# Patient Record
Sex: Female | Born: 1945 | Race: White | Hispanic: No | Marital: Married | Smoking: Never smoker
Health system: Southern US, Community
[De-identification: ages and names within clinical notes are randomized; demographics above are authoritative.]

## PROBLEM LIST (undated history)

## (undated) ENCOUNTER — Emergency Department (HOSPITAL_COMMUNITY): Admission: EM | Payer: Medicare Other | Source: Home / Self Care

## (undated) DIAGNOSIS — E669 Obesity, unspecified: Secondary | ICD-10-CM

## (undated) DIAGNOSIS — F329 Major depressive disorder, single episode, unspecified: Secondary | ICD-10-CM

## (undated) DIAGNOSIS — I482 Chronic atrial fibrillation, unspecified: Secondary | ICD-10-CM

## (undated) DIAGNOSIS — H1132 Conjunctival hemorrhage, left eye: Secondary | ICD-10-CM

## (undated) DIAGNOSIS — E119 Type 2 diabetes mellitus without complications: Secondary | ICD-10-CM

## (undated) DIAGNOSIS — C55 Malignant neoplasm of uterus, part unspecified: Secondary | ICD-10-CM

## (undated) DIAGNOSIS — M1990 Unspecified osteoarthritis, unspecified site: Secondary | ICD-10-CM

## (undated) DIAGNOSIS — I10 Essential (primary) hypertension: Secondary | ICD-10-CM

## (undated) DIAGNOSIS — I5032 Chronic diastolic (congestive) heart failure: Secondary | ICD-10-CM

## (undated) DIAGNOSIS — F32A Depression, unspecified: Secondary | ICD-10-CM

## (undated) DIAGNOSIS — D509 Iron deficiency anemia, unspecified: Secondary | ICD-10-CM

## (undated) HISTORY — PX: ENUCLEATION: SHX628

## (undated) HISTORY — PX: BASAL CELL CARCINOMA EXCISION: SHX1214

## (undated) HISTORY — PX: ABDOMINAL HYSTERECTOMY: SHX81

## (undated) HISTORY — DX: Malignant neoplasm of uterus, part unspecified: C55

## (undated) HISTORY — PX: DILATION AND CURETTAGE OF UTERUS: SHX78

## (undated) HISTORY — DX: Obesity, unspecified: E66.9

## (undated) HISTORY — PX: OTHER SURGICAL HISTORY: SHX169

---

## 2000-10-04 ENCOUNTER — Encounter: Payer: Self-pay | Admitting: Family Medicine

## 2000-10-04 ENCOUNTER — Ambulatory Visit (HOSPITAL_COMMUNITY): Admission: RE | Admit: 2000-10-04 | Discharge: 2000-10-04 | Payer: Self-pay | Admitting: Family Medicine

## 2001-05-08 ENCOUNTER — Emergency Department (HOSPITAL_COMMUNITY): Admission: EM | Admit: 2001-05-08 | Discharge: 2001-05-08 | Payer: Self-pay | Admitting: Emergency Medicine

## 2001-10-09 ENCOUNTER — Encounter: Payer: Self-pay | Admitting: Family Medicine

## 2001-10-09 ENCOUNTER — Ambulatory Visit (HOSPITAL_COMMUNITY): Admission: RE | Admit: 2001-10-09 | Discharge: 2001-10-09 | Payer: Self-pay | Admitting: Family Medicine

## 2002-06-29 ENCOUNTER — Ambulatory Visit (HOSPITAL_COMMUNITY): Admission: RE | Admit: 2002-06-29 | Discharge: 2002-06-29 | Payer: Self-pay | Admitting: Family Medicine

## 2002-06-29 ENCOUNTER — Encounter: Payer: Self-pay | Admitting: Family Medicine

## 2002-09-28 ENCOUNTER — Ambulatory Visit (HOSPITAL_COMMUNITY): Admission: RE | Admit: 2002-09-28 | Discharge: 2002-09-28 | Payer: Self-pay | Admitting: Family Medicine

## 2002-09-28 ENCOUNTER — Encounter: Payer: Self-pay | Admitting: Family Medicine

## 2002-10-10 ENCOUNTER — Ambulatory Visit (HOSPITAL_COMMUNITY): Admission: RE | Admit: 2002-10-10 | Discharge: 2002-10-10 | Payer: Self-pay | Admitting: *Deleted

## 2002-11-05 ENCOUNTER — Encounter (HOSPITAL_COMMUNITY): Admission: RE | Admit: 2002-11-05 | Discharge: 2002-12-05 | Payer: Self-pay | Admitting: Cardiology

## 2002-11-19 ENCOUNTER — Ambulatory Visit (HOSPITAL_COMMUNITY): Admission: RE | Admit: 2002-11-19 | Discharge: 2002-11-19 | Payer: Self-pay | Admitting: Internal Medicine

## 2004-06-11 ENCOUNTER — Ambulatory Visit (HOSPITAL_COMMUNITY): Admission: RE | Admit: 2004-06-11 | Discharge: 2004-06-11 | Payer: Self-pay | Admitting: Ophthalmology

## 2005-07-21 ENCOUNTER — Ambulatory Visit (HOSPITAL_COMMUNITY): Admission: RE | Admit: 2005-07-21 | Discharge: 2005-07-21 | Payer: Self-pay | Admitting: Family Medicine

## 2005-08-18 ENCOUNTER — Ambulatory Visit (HOSPITAL_COMMUNITY): Admission: RE | Admit: 2005-08-18 | Discharge: 2005-08-18 | Payer: Self-pay | Admitting: Family Medicine

## 2005-08-23 ENCOUNTER — Encounter (INDEPENDENT_AMBULATORY_CARE_PROVIDER_SITE_OTHER): Payer: Self-pay | Admitting: *Deleted

## 2005-08-23 ENCOUNTER — Encounter: Admission: RE | Admit: 2005-08-23 | Discharge: 2005-08-23 | Payer: Self-pay | Admitting: Family Medicine

## 2007-01-12 ENCOUNTER — Ambulatory Visit (HOSPITAL_COMMUNITY): Admission: RE | Admit: 2007-01-12 | Discharge: 2007-01-12 | Payer: Self-pay | Admitting: Family Medicine

## 2007-04-18 ENCOUNTER — Ambulatory Visit (HOSPITAL_COMMUNITY): Admission: RE | Admit: 2007-04-18 | Discharge: 2007-04-18 | Payer: Self-pay | Admitting: Family Medicine

## 2007-11-27 ENCOUNTER — Ambulatory Visit (HOSPITAL_COMMUNITY): Admission: RE | Admit: 2007-11-27 | Discharge: 2007-11-27 | Payer: Self-pay | Admitting: Ophthalmology

## 2007-12-11 ENCOUNTER — Ambulatory Visit (HOSPITAL_COMMUNITY): Admission: RE | Admit: 2007-12-11 | Discharge: 2007-12-11 | Payer: Self-pay | Admitting: Ophthalmology

## 2008-09-02 ENCOUNTER — Ambulatory Visit (HOSPITAL_COMMUNITY): Admission: RE | Admit: 2008-09-02 | Discharge: 2008-09-02 | Payer: Self-pay | Admitting: Family Medicine

## 2008-09-09 ENCOUNTER — Ambulatory Visit (HOSPITAL_COMMUNITY): Admission: RE | Admit: 2008-09-09 | Discharge: 2008-09-09 | Payer: Self-pay | Admitting: Ophthalmology

## 2008-10-02 ENCOUNTER — Ambulatory Visit (HOSPITAL_COMMUNITY): Admission: RE | Admit: 2008-10-02 | Discharge: 2008-10-02 | Payer: Self-pay | Admitting: Family Medicine

## 2008-10-22 ENCOUNTER — Ambulatory Visit: Payer: Self-pay | Admitting: Cardiology

## 2008-10-23 ENCOUNTER — Ambulatory Visit (HOSPITAL_COMMUNITY): Admission: RE | Admit: 2008-10-23 | Discharge: 2008-10-23 | Payer: Self-pay | Admitting: Cardiology

## 2008-10-23 ENCOUNTER — Encounter: Payer: Self-pay | Admitting: Cardiology

## 2008-10-23 ENCOUNTER — Ambulatory Visit: Payer: Self-pay | Admitting: Cardiology

## 2008-11-08 ENCOUNTER — Encounter (INDEPENDENT_AMBULATORY_CARE_PROVIDER_SITE_OTHER): Payer: Self-pay | Admitting: *Deleted

## 2008-11-08 LAB — CONVERTED CEMR LAB
Brain Natriuretic Peptide: 118.7
CO2: 26 meq/L
Calcium: 8.8 mg/dL
Chloride: 105 meq/L
Glucose, Bld: 166 mg/dL
Sodium: 144 meq/L

## 2008-11-22 ENCOUNTER — Ambulatory Visit: Payer: Self-pay | Admitting: Cardiology

## 2008-12-19 DIAGNOSIS — R0602 Shortness of breath: Secondary | ICD-10-CM

## 2008-12-19 DIAGNOSIS — I10 Essential (primary) hypertension: Secondary | ICD-10-CM

## 2008-12-20 ENCOUNTER — Ambulatory Visit: Payer: Self-pay | Admitting: Cardiology

## 2009-04-25 ENCOUNTER — Encounter (INDEPENDENT_AMBULATORY_CARE_PROVIDER_SITE_OTHER): Payer: Self-pay | Admitting: *Deleted

## 2009-04-25 ENCOUNTER — Ambulatory Visit: Payer: Self-pay | Admitting: Cardiology

## 2009-04-25 DIAGNOSIS — E669 Obesity, unspecified: Secondary | ICD-10-CM | POA: Insufficient documentation

## 2009-04-25 DIAGNOSIS — I110 Hypertensive heart disease with heart failure: Secondary | ICD-10-CM | POA: Insufficient documentation

## 2009-04-25 DIAGNOSIS — I509 Heart failure, unspecified: Secondary | ICD-10-CM

## 2009-04-25 DIAGNOSIS — E1121 Type 2 diabetes mellitus with diabetic nephropathy: Secondary | ICD-10-CM | POA: Insufficient documentation

## 2009-05-16 ENCOUNTER — Encounter: Payer: Self-pay | Admitting: Cardiology

## 2009-05-16 ENCOUNTER — Encounter (INDEPENDENT_AMBULATORY_CARE_PROVIDER_SITE_OTHER): Payer: Self-pay | Admitting: *Deleted

## 2009-05-16 LAB — CONVERTED CEMR LAB
AST: 32 units/L
Albumin: 4.5 g/dL (ref 3.5–5.2)
BUN: 14 mg/dL
CO2: 29 meq/L
Calcium: 9.4 mg/dL (ref 8.4–10.5)
Chloride: 103 meq/L
Chloride: 103 meq/L (ref 96–112)
Cholesterol: 106 mg/dL
HDL: 30 mg/dL
HDL: 30 mg/dL — ABNORMAL LOW (ref >39)
LDL Cholesterol: 54 mg/dL (ref 0–99)
Potassium: 3.8 meq/L
Potassium: 3.8 meq/L (ref 3.5–5.3)
Sodium: 143 meq/L
Sodium: 143 meq/L (ref 135–145)
Total Protein: 7.5 g/dL
Total Protein: 7.5 g/dL (ref 6.0–8.3)
Triglycerides: 108 mg/dL
VLDL: 22 mg/dL (ref 0–40)

## 2009-05-19 ENCOUNTER — Encounter (INDEPENDENT_AMBULATORY_CARE_PROVIDER_SITE_OTHER): Payer: Self-pay | Admitting: *Deleted

## 2009-05-26 ENCOUNTER — Ambulatory Visit: Payer: Self-pay | Admitting: Cardiology

## 2009-07-07 ENCOUNTER — Ambulatory Visit: Payer: Self-pay | Admitting: Cardiology

## 2009-08-11 ENCOUNTER — Ambulatory Visit: Payer: Self-pay | Admitting: Cardiology

## 2009-09-08 ENCOUNTER — Ambulatory Visit: Payer: Self-pay | Admitting: Cardiology

## 2010-04-21 ENCOUNTER — Encounter (INDEPENDENT_AMBULATORY_CARE_PROVIDER_SITE_OTHER): Payer: Self-pay | Admitting: *Deleted

## 2010-04-28 ENCOUNTER — Ambulatory Visit: Payer: Self-pay | Admitting: Cardiology

## 2010-04-28 LAB — CONVERTED CEMR LAB
ALT: 19 units/L (ref 0–35)
Basophils Absolute: 0.1 10*3/uL (ref 0.0–0.1)
Calcium: 9.6 mg/dL (ref 8.4–10.5)
Eosinophils Relative: 2 % (ref 0–5)
Glucose, Bld: 162 mg/dL — ABNORMAL HIGH (ref 70–99)
HCT: 41.4 % (ref 36.0–46.0)
Hemoglobin: 13.4 g/dL (ref 12.0–15.0)
Hgb A1c MFr Bld: 7.5 % — ABNORMAL HIGH (ref <5.7)
Lymphocytes Relative: 31 % (ref 12–46)
Monocytes Absolute: 0.6 10*3/uL (ref 0.1–1.0)
Neutro Abs: 5.1 10*3/uL (ref 1.7–7.7)
Platelets: 183 10*3/uL (ref 150–400)
Potassium: 4.3 meq/L (ref 3.5–5.3)
RDW: 14.7 % (ref 11.5–15.5)
Total Bilirubin: 0.8 mg/dL (ref 0.3–1.2)
Total Protein: 6.7 g/dL (ref 6.0–8.3)

## 2010-07-14 NOTE — Assessment & Plan Note (Signed)
Summary: 1 mth bp check per checkout on 2/28/tg  Nurse Visit   Vital Signs:  Patient profile:   65 year old female Height:      67 inches Weight:      286 pounds O2 Sat:      97 % on Room air Pulse rate:   71 / minute BP sitting:   121 / 70  (left arm)  Vitals Entered By: Teressa Lower RN (September 08, 2009 8:45 AM)  O2 Flow:  Room air  Visit Type:  1 month bp check Primary Provider:  Dr. Nobie Putnam   History of Present Illness: short of breath,pounding heart hear beat in her ear with exertion, 5 lbs wt gain in 1 month, 1+ pitting edema in lower legs, l>r bp diary brought by pt: 8 readings: 08-13-09        156/61,69             cbg:   192 08-23-09      1499/64,69            cbg:  175 08-25-09      148/72,70              cbg:  187 08-30-09      151/63,70              cbg:  169 09-04-09      158/62,70              cbg:  179 09-05-09      148/60,69              cbg:  147 09-07-09      154/58,72              cbg:  177 09-08-09      153/59,68              cbg:  137 pt took a rx of pcn, not hers, two times a day,until she developed diarrhea then she stopped,it was for an abcess tooth.    Current Medications (verified): 1)  Labetalol Hcl 300 Mg Tabs (Labetalol Hcl) .... Take 1 Tablet By Mouth Two Times A Day 2)  Metformin Hcl 500 Mg Tabs (Metformin Hcl) .... Take 1 Tab Three Times A Day 3)  Losartan Potassium 100 Mg Tabs (Losartan Potassium) .... Take 1 Tab Daily 4)  Glyburide 5 Mg Tabs (Glyburide) .... Take 1 Tab Three Times A Day 5)  Torsemide 20 Mg Tabs (Torsemide) .... Take 1 Tab Two Times A Day 6)  Clonidine Hcl 0.2 Mg Tabs (Clonidine Hcl) .... Take 1 Tablet By Mouth Two Times A Day 7)  Alprazolam 0.5 Mg Tabs (Alprazolam) .... Take 1/2 -1 Tablet By Mouth Every 6 Hrs As Needed 8)  Verapamil Hcl Cr 360 Mg Xr24h-Cap (Verapamil Hcl) .... Take One Capsule By Mouth Daily 9)  Simvastatin 20 Mg Tabs (Simvastatin) .... Take One Tablet By Mouth Daily At Bedtime  Allergies (verified): 1)  !  Ace Inhibitors  Impression & Recommendations:  Problem # 1:  HYPERTENSION (ICD-401.9) Increase labetalol to 450 mg two times a day  [place] Bing, M.D.  Appended Document: 1 mth bp check per checkout on 2/28/tg    Phone Note Outgoing Call   Call placed by: Larita Fife Via LPN,  September 10, 2009 11:34 AM Summary of Call: Patient advised, she gave verbal understanding.    New/Updated Medications: LABETALOL HCL 300 MG TABS (LABETALOL HCL) Take 1 and 1/2 tablets (450mg ) by mouth two times a  day Prescriptions: LABETALOL HCL 300 MG TABS (LABETALOL HCL) Take 1 and 1/2 tablets (450mg ) by mouth two times a day  #45 x 3   Entered by:   Larita Fife Via LPN   Authorized by:   Kathlen Brunswick, MD, [facility]   Signed by:   Larita Fife Via LPN on 16/03/9603   Method used:   [facility]        [facility] [phone]* (retail)       [address]       [place], [place]  [address]       Ph: [phone] or [phone]       Fax: [phone]   RxID:   [id number]

## 2010-07-14 NOTE — Assessment & Plan Note (Signed)
Summary: 1 mth nurse visit per Tammy/tg  Nurse Visit   Vital Signs:  Patient profile:   65 year old female O2 Sat:      95 % Pulse rate:   75 / minute BP sitting:   127 / 67  (left arm)  Vitals Entered ByLarita Fife Via LPN (August 11, 2009 8:52 AM)  Impression & Recommendations:  Problem # 1:  HYPERTENSIVE CARDIOVASCULAR DISEASE, WITH CHF (ICD-402.91) Increase clonidine to .2 two times a day Continue home BPs BP check in 1 month. Call office for side effects.  Pt should not change meds on her own.  [place] Bing, M.D. Patient advised and she states she understands instructions given.  Primary Provider:  Dr. Nobie Putnam   History of Present Illness: 1 month BP check. Patient c/o SOB with exertion, otherwise she has no complaints at this time. The increase in Labetalol made her very tired and weak so she cut back to one 300mg  tablet two times a day (instead of 1 and 1/2) and states she feels better. Her BP diary shows readings higher than todays visit of 127/67. BP's scanned into chart.      Allergies: 1)  ! Ace Inhibitors Prescriptions: CLONIDINE HCL 0.2 MG TABS (CLONIDINE HCL) take 1 tablet by mouth two times a day  #60 x 6   Entered by:   Larita Fife Via LPN   Authorized by:   Kathlen Brunswick, MD, [facility]   Signed by:   Larita Fife Via LPN on 78/29/5621   Method used:   Electronically to        [facility] (retail)       [address]       [place], [place]  [address]       Ph: [phone]       Fax: [phone]   RxID:   [id number]

## 2010-07-14 NOTE — Miscellaneous (Signed)
Summary: labs cmp,lipids,05/16/2009  Clinical Lists Changes  Observations: Added new observation of CALCIUM: 9.4 mg/dL (13/01/6577 46:96) Added new observation of ALBUMIN: 4.5 g/dL (29/52/8413 24:40) Added new observation of PROTEIN, TOT: 7.5 g/dL (04/10/2535 64:40) Added new observation of SGPT (ALT): 20 units/L (05/16/2009 10:45) Added new observation of SGOT (AST): 32 units/L (05/16/2009 10:45) Added new observation of ALK PHOS: 96 units/L (05/16/2009 10:45) Added new observation of CREATININE: 0.93 mg/dL (34/74/2595 63:87) Added new observation of BUN: 14 mg/dL (56/43/3295 18:84) Added new observation of BG RANDOM: 192 mg/dL (16/60/6301 60:10) Added new observation of CO2 PLSM/SER: 29 meq/L (05/16/2009 10:45) Added new observation of CL SERUM: 103 meq/L (05/16/2009 10:45) Added new observation of K SERUM: 3.8 meq/L (05/16/2009 10:45) Added new observation of NA: 143 meq/L (05/16/2009 10:45) Added new observation of LDL: 54 mg/dL (93/23/5573 22:02) Added new observation of HDL: 30 mg/dL (54/27/0623 76:28) Added new observation of TRIGLYC TOT: 108 mg/dL (31/51/7616 07:37) Added new observation of CHOLESTEROL: 106 mg/dL (10/62/6948 54:62)

## 2010-07-14 NOTE — Assessment & Plan Note (Signed)
Summary: 1 YR F/U PER CHECKOUT ON 04/25/09/TG   Visit Type:  Follow-up Primary Provider:  Dr.Golding   History of Present Illness: [name] returns to the office for continued assessment and treatment of hypertension and diabetes.  Since she was last seen, she has done quite well with no new medical problems, no need for urgent medical attention and improved control of both diabetes and hypertension.  She achieved exactly a 20 pound weight loss over the past 12 months, which she tells me I wagered her that she could not do.  As a result, fasting CBGs have fallen from 200 to 150.  Current Medications (verified): 1)  Labetalol Hcl 300 Mg Tabs (Labetalol Hcl) .... Take 1 and 1/2 Tablets (450mg ) By Mouth Two Times A Day 2)  Metformin Hcl 1000 Mg Tabs (Metformin Hcl) .... Take 1 Tab Two Times A Day 3)  Losartan Potassium 100 Mg Tabs (Losartan Potassium) .... Take 1 Tab Daily 4)  Glyburide 5 Mg Tabs (Glyburide) .... Take 1 Tab Two Times A Day 5)  Torsemide 20 Mg Tabs (Torsemide) .... Take 1 Tab Two Times A Day 6)  Clonidine Hcl 0.2 Mg Tabs (Clonidine Hcl) .... Take 1 Tablet By Mouth Two Times A Day 7)  Alprazolam 0.5 Mg Tabs (Alprazolam) .... Take 1/2 -1 Tablet By Mouth Every 6 Hrs As Needed 8)  Verapamil Hcl Cr 360 Mg Xr24h-Cap (Verapamil Hcl) .... Take One Capsule By Mouth Daily 9)  Simvastatin 20 Mg Tabs (Simvastatin) .... Take One Tablet By Mouth Daily At Bedtime  Allergies (verified): 1)  ! Ace Inhibitors  Comments:  Nurse/Medical Assistant: patients metformin has changed from 500 two times a day to 1000 two times a day  and glipizide two times a day instead of tid  Past History:  PMH, FH, and Social History reviewed and updated.  Review of Systems       The patient complains of weight loss.  The patient denies hoarseness, chest pain, dyspnea on exertion, peripheral edema, and prolonged cough.    Vital Signs:  Patient profile:   65 year old female Weight:      266  pounds BMI:     41.81 O2 Sat:      96 % on Room air Pulse rate:   83 / minute BP sitting:   138 / 75  (right arm)  Vitals Entered By: Dreama Saa, CNA (April 28, 2010 2:17 PM)  O2 Flow:  Room air  Physical Exam  General:   General-Well developed; no acute distress; obese Neck-No JVD; no carotid bruits: Lungs-No tachypnea, no rales; no rhonchi; no wheezes: Cardiovascular-normal PMI; normal S1 and S2; minimal systolic murmur at the cardiac base Abdomen-BS normal; soft and non-tender without masses or organomegaly:  Musculoskeletal-No deformities, no cyanosis or clubbing: Neurologic-Normal cranial nerves; symmetric strength and tone:  Skin-Warm, no significant lesions: Extremities-Nl distal pulses; 1/2+ edema:     Impression & Recommendations:  Problem # 1:  HYPERTENSIVE CARDIOVASCULAR DISEASE, WITH CHF (ICD-402.91) Hypertension and CHF are well compensated at the present time; current therapy will be continued.  Appropriate laboratory studies have been requested.  Problem # 2:  DIABETES MELLITUS, TYPE II (ICD-250.00) Diabetic control appears to be improved; a hemoglobin A1c level will be obtained.  Problem # 3:  OBESITY (ICD-278.00) Patient is congratulated on weight loss and not charged for this visit.  She is encouraged to lose an additional 40 pounds prior to the time I see her again.  Other Orders: T-Comprehensive  Metabolic Panel [id number]) T-CBC w/Diff [id number]) T-Hgb A1C ([id number])  Patient Instructions: 1)  Your physician recommends that you schedule a follow-up appointment in: 1 year 2)  Your physician recommends that you return for lab work in: today 3)  Your physician recommends that you continue on your current medications as directed. Please refer to the Current Medication list given to you today. Prescriptions: TORSEMIDE 20 MG TABS (TORSEMIDE) take 1 tab two times a day  #60 x 11   Entered by:   Larita Fife Via LPN   Authorized by:   Kathlen Brunswick, MD, [facility]   Signed by:   Larita Fife Via LPN on 42/59/5638   Method used:   [facility]ronically to        [facility] [phone]* (retail)       [address]       [place], [place]  [address]       Ph: [phone] or [phone]       Fax: [phone]   RxID:   [id number]

## 2010-07-14 NOTE — Assessment & Plan Note (Signed)
Summary: 1 mth bp check per Brittaney Beaulieu/tg  Nurse Visit   Vital Signs:  Patient profile:   65 year old female Height:      67 inches Weight:      281 pounds O2 Sat:      97 % on Room air Pulse rate:   74 / minute BP sitting:   156 / 74  (left arm)  Vitals Entered By: Teressa Lower RN (July 07, 2009 9:12 AM)  O2 Flow:  Room air  Impression & Recommendations:  Problem # 1:  HYPERTENSIVE CARDIOVASCULAR DISEASE, WITH CHF (ICD-402.91) Her updated medication list for this problem includes:    Labetalol Hcl 300 Mg Tabs (Labetalol hcl) .Marland Kitchen... Take 1 1/2  tablets  by mouth twice a day    Losartan Potassium 100 Mg Tabs (Losartan potassium) .Marland Kitchen... Take 1 tab daily    Torsemide 20 Mg Tabs (Torsemide) .Marland Kitchen... Take 1 tab two times a day    Clonidine Hcl 0.1 Mg Tabs (Clonidine hcl) .Marland Kitchen... Take 1 tab two times a day    Verapamil Hcl Cr 360 Mg Xr24h-cap (Verapamil hcl) .Marland Kitchen... Take one capsule by mouth daily  Increase labetalol to 450mg  two times a day; continue home BPs.  RN visit in 1 month.   Bing, M.D.   Visit Type:  nurse visit Primary Provider:  Dr. Nobie Putnam   History of Present Illness: pt c/o sob and palp at night upon waking while lying on her back in bed, she states she wakes with a startle, she states she snores and has never been tested for sleep apnea.  pt's monitored bp's at home  1-19     173/66,71     cbg  156 1-21     160/66,70     cbg 170 1-23     172/74, 69    cbg  155 1-24     173/75,68     cbg   181   Current Medications (verified): 1)  Labetalol Hcl 300 Mg Tabs (Labetalol Hcl) .... Take One Tablet By Mouth Twice A Day 2)  Metformin Hcl 500 Mg Tabs (Metformin Hcl) .... Take 1 Tab Three Times A Day 3)  Losartan Potassium 100 Mg Tabs (Losartan Potassium) .... Take 1 Tab Daily 4)  Glyburide 5 Mg Tabs (Glyburide) .... Take 1 Tab Three Times A Day 5)  Torsemide 20 Mg Tabs (Torsemide) .... Take 1 Tab Two Times A Day 6)  Clonidine Hcl 0.1 Mg Tabs (Clonidine Hcl) ....  Take 1 Tab Two Times A Day 7)  Alprazolam 0.5 Mg Tabs (Alprazolam) .... Take 1/2  Tab At Bedtime 8)  Verapamil Hcl Cr 360 Mg Xr24h-Cap (Verapamil Hcl) .... Take One Capsule By Mouth Daily 9)  Simvastatin 20 Mg Tabs (Simvastatin) .... Take One Tablet By Mouth Daily At Bedtime  Allergies (verified): 1)  ! Ace Inhibitors Prescriptions: LABETALOL HCL 300 MG TABS (LABETALOL HCL) Take 1 1/2  tablets  by mouth twice a day  #45 x 6   Entered by:   Teressa Lower RN   Authorized by:   Kathlen Brunswick, MD, [facility]   Signed by:   Teressa Lower RN on 07/08/2009   Method used:   Electronically to        [facility] [phone]* (retail)       [address]       [place], [place]  [address]       Ph: [phone] or  [phone]       Fax: [phone]   RxID:   [id number]

## 2010-07-14 NOTE — Letter (Signed)
Summary: BP readings for nurse visit on 08/11/09  BP readings for nurse visit on 08/11/09   Imported By: Raechel Ache [facility] 08/11/2009 09:27:44  _____________________________________________________________________  External Attachment:    Type:   Image     Comment:   External Document

## 2010-10-27 NOTE — Assessment & Plan Note (Signed)
[facility]                       [place] CARDIOLOGY OFFICE NOTE   NAME:Krystal [name], Krystal [name]                    MRN:          [id number]  DATE:11/22/2008                            DOB:          [birth year]    CARDIOLOGIST:  Gerrit Friends. Dietrich Pates, MD, [facility]   PRIMARY CARE PHYSICIAN:  Patrica Duel, MD   REASON FOR VISIT:  One-month followup.   HISTORY OF PRESENT ILLNESS:  Krystal [name] is a 65 year old female patient  who was evaluated by Dr. Dietrich Pates secondary to lower extremity edema and  shortness of breath.  Dr. Dietrich Pates discontinued her Actos, increased her  metoprolol to 50 mg b.i.d. and placed her on clonidine 0.1 mg twice a  day.  The patient had problems with significant fatigue.  Her primary  care physician decreased her metoprolol back to 25 mg twice a day and  her clonidine to 0.1 mg half tablet twice a day.  She had an  echocardiogram performed that demonstrated an EF of 60%, no regional  wall motion abnormalities, upper septal thickening and moderate mitral  regurgitation.  She also had evidence of pulmonary hypertension with a  PA peak pressure of 58 mmHg.  Her RV systolic function was mildly  reduced.  In the office today, she notes that she is doing much better.  She describes NYHA class IIB symptoms.  She denies orthopnea or PND.  Her pedal edema is much improved.  She denies syncope.  She denies chest  pain.   CURRENT MEDICATIONS:  1. Metoprolol 25 mg b.i.d.  2. Glyburide/metformin 5/500 mg 3 times a day.  3. Simvastatin 20 mg daily.  4. Diovan 320 mg daily.  5. Torsemide 20 mg b.i.d.  6. Bupropion 300 mg daily.  7. Multivitamin daily.  8. Verapamil ER 180 mg daily.  9. Clonidine 0.1 mg half tablet b.i.d.  10.Alprazolam 0.5 mg q.8 h. p.r.n.  11.Tylenol p.r.n.   PHYSICAL EXAMINATION:  GENERAL:  She is a well-nourished, well-developed  female in no acute distress.  VITAL SIGNS:  Blood pressure is 161/72; pulse 78; weight 303  pounds,  which is down 6 pounds since her last visit.  HEENT:  Normal.  NECK:  Without JVD.  CARDIAC:  S1 and S2, regular rate and rhythm.  LUNGS:  Clear to auscultation bilaterally.  No wheezing or rales.  ABDOMEN:  Soft, nontender with normoactive bowel sounds.  No  organomegaly.  EXTREMITIES:  With trace 1+ edema bilaterally.  NEUROLOGIC:  She is alert and oriented x3.  Cranial nerves II-XII are  grossly intact.  SKIN:  Warm and dry.   ASSESSMENT AND PLAN:  1. Hypertension.  This is still uncontrolled.  The patient was also      interviewed and examined by Dr. Dietrich Pates.  She had significant      symptoms of fatigue with her clonidine and metoprolol.  Those      dosages were adjusted with improved symptoms.  However, she needs      better control.  We will increase her clonidine back to 0.1 mg 1      whole tablet twice a day.  Her  verapamil will be increased to 240      mg a day.  Her metoprolol will be discontinued.  She will likely      need further adjustments in her therapy.  She has been asked to      keep a track of her blood pressures and follow up with the nurse in      1 month.  We will review her blood pressures at that time and make      further adjustments.  2. History of diastolic congestive heart failure.  As noted above, her      recent echocardiogram demonstrated normal left ventricular      function.  She also has moderate mitral regurgitation and evidence      of pulmonary hypertension.  Overall, her volume status seems to be      stable at this time.  Her BNP when checked after her previous visit      was 118.7.  She will continue on her current dose of torsemide.  3. Obesity.  I had a long conversation with the patient today      regarding diet and weight loss.  She has been advised to keep track      of her diet over the next several weeks.  She has also been asked      to look at the [employer] to get ideas for dietary changes.      I have given her a  goal of losing 10 pounds over the next several      months.   DISPOSITION:  She will be brought back in followup with Dr. Dietrich Pates in  5 months or sooner p.r.n.      Tereso Newcomer, PA-C  Electronically Signed      Gerrit Friends. Dietrich Pates, MD, [facility]  Electronically Signed   SW/MedQ  DD: 11/22/2008  DT: 11/23/2008  Job #: [id number]   cc:   Patrica Duel, M.D.

## 2010-10-27 NOTE — Letter (Signed)
Oct 22, 2008    Patrica Duel, MD  [address]  [place], [place] [address].   RE:  Krystal, [name]  MRN:  [id number]  /  DOB:  09/16/1945   Dear Loraine Leriche:   It was my pleasure evaluating Krystal [name] in the office today in  consultation at your request.  As you know, this nice woman saw my prior  partner, Dr. Dorethea Clan, approximately 6 years ago for congestive heart  failure.  Echocardiography demonstrated mild-to-moderate mitral  regurgitation, LVH, and normal LV systolic function.  A stress nuclear  study revealed no evidence for myocardial ischemia.  Dr. Marchelle Folks  conclusion was that Krystal [name] had diastolic dysfunction leading to  congestive heart failure and fluid retention.  She diuresed 25 pounds  and felt much better.  She had done well since until a few weeks ago  when she noted recurrent pedal edema.  This has apparently been an  intermittent problem for which her dose of Actos has been reduced.  Her  dose of diuretic was also increased.  She has diuresed once again and  felt better, although she is not sure how much weight, if any, she has  lost.  She has limited by her use of diuretics due to the need to  urinate frequently and is unable to take them if she is leaving the  house for any period of time.   CURRENT MEDICATIONS:  1. Metoprolol 25 mg b.i.d.  2. Glyburide and metformin 5/500 mg t.i.d.  3. Actos 7.5 mg daily.  4. Simvastatin 20 mg daily.  5. Allopurinol 100 mg daily.  6. Diovan 320 mg daily.  7. Amlodipine 5 mg daily.  8. Torsemide 20 mg b.i.d.  9. Bupropion 300 mg daily.  10.She uses alprazolam p.r.n.   ALLERGIES:  She has experienced adverse reactions to ACE INHIBITORS in  the past.   PAST MEDICAL HISTORY:  Otherwise notable for resection of basal cell  carcinoma, enucleation on the left, and hysterectomy.  She has had  longstanding diabetes with good control and a good recent A1c level by  her report.  Hypertension has also been present  for years.  The degree  to which this is controlled as somewhat uncertain.   SOCIAL HISTORY:  Unemployed; married with 1 child.  No use of tobacco  products or alcohol.   FAMILY HISTORY:  Father died due to complication of diabetes; mother  died of cause unknown.  She has 9 siblings, 5 of whom have died due to  neoplastic disease and 1 due to suicide.  One of her brothers has a  history of CHF.   REVIEW OF SYSTEMS:  Notable for the need for corrective lenses, history  of glaucoma, lower partial dentures, and intermittent palpitations.  She  has been told of a heart murmur in the past.  She has a history of gout  affecting her toes.  All other systems reviewed and are negative.   PHYSICAL EXAMINATION:  GENERAL:  Pleasant overweight woman in no acute  distress.  VITAL SIGNS:  The weight is 309, more than 50 pounds in excess of her  weight in May 2004.  Blood pressure 165/70 in the left arm sitting with  a large cuff.  Heart rate 75 and regular, respirations 12 and unlabored.  NECK:  No jugular venous distention; normal carotid upstrokes without  bruits.  ENDOCRINE:  No thyromegaly.  HEMATOPOIETIC:  No adenopathy.  HEENT:  Anicteric sclerae; normal oral mucosa.  LUNGS:  Clear.  CARDIAC:  Normal first and second heart sounds; no murmur nor four or  third heart sound appreciated.  ABDOMEN:  Soft and nontender; normal bowel sounds; no organomegaly.  EXTREMITIES:  Diameter of the left leg larger than the right; chronic  skin erythema on the left; 1+ pitting edema bilaterally; distal pulses  intact.   No recent laboratory is available.   IMPRESSION:  Krystal [name] has longstanding hypertension, currently  without adequate control.  Her dose of metoprolol will be increased to  50 mg b.i.d.  Clonidine 0.1 mg will be added per day and then increased  to a b.i.d. dosage.  She will return in 1 month for reassessment of  blood pressure control.   She has had intermittent fluid retention  attributed to diastolic  dysfunction.  There has been a question of significant mitral valve  regurgitation in the past.  An echocardiogram will be obtained to  investigate this possibility.   She has had substantial pedal edema.  This was apparently exacerbated by  Actos, which will be discontinued.  Amlodipine may also be contributing.  Verapamil 180 mg daily will be substituted.  I am not inclined to  increase her dose of diuretics at the present time.  She was told that  she could take it as a single morning dose if nocturia becomes trouble  some.   I appreciate the opportunity given for our practice to reassess this  nice woman.  I will be happy to attempt to optimize her medical therapy.  A BNP level will be obtained.    Sincerely,      Gerrit Friends. Dietrich Pates, MD, [facility]  Electronically Signed    RMR/MedQ  DD: 10/22/2008  DT: 10/23/2008  Job #: [id number]
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
## 2010-10-30 NOTE — Procedures (Signed)
   NAME:  EMMELINE, WINEBARGER                       ACCOUNT NO.:  [id number]   MEDICAL RECORD NO.:  [id number]                   PATIENT TYPE:  OUT   LOCATION:  RAD                                  FACILITY:  [facility]   PHYSICIAN:  B[place] Bing, M.D.               DATE OF BIRTH:  September 08, 1945   DATE OF PROCEDURE:  10/10/2002                  AGE:  65  DATE OF DISCHARGE:  10/10/2002                  SEX:  F                                CARDIAC ULTRASOUND   CLINICAL INFORMATION:  The patient is a 65 year old woman with congestive  heart failure, hypertension, and diabetes.   PREVIOUS STUDY:  @@   M-MODE:  AORTA:  3.2  (<4.0)  LEFT ATRIUM:  5.2  (<4.0)  SEPTUM:  1.6  (0.7-1.1)  POSTERIOR WALL:  1.2  (0.7-1.1)  LV-DIASTOLE:  4.8  (<5.7)  LV-SYSTOLE:  3.5  (<4.0)  E-SEPTAL:  @@  (<0.5)  RV-DIASTOLE:  @@  (<2.5)  IVC:  @@  (<2.0)   TWO-DIMENSIONAL AND DOPPLER IMAGING:  1. Technically suboptimal but adequate echocardiographic study.  2. Moderate left atrial enlargement; mild right atrial enlargement.  Normal     right ventricular size and function.  3. Aortic valvular sclerosis with annular calcification.  4. Mild mitral valve thickening; mild annular calcification; mild to     moderate mitral regurgitation.  5. Normal tricuspid valve with mild regurgitation; estimated RV systolic     pressure is mildly to moderately elevated.  6. Pulmonic valve not well imaged - probably normal.  7. Normal internal dimension of the left ventricle; no LVH.  Normal regional     and global LV systolic function.                                               Callaway Bing, M.D.    RR/MEDQ  D:  10/11/2002  T:  10/12/2002  Job:  [id number]

## 2010-10-30 NOTE — Op Note (Signed)
NAME:  Krystal [name], Krystal [name]                       ACCOUNT NO.:  [id number]   MEDICAL RECORD NO.:  [id number]                   PATIENT TYPE:  AMB   LOCATION:  DAY                                  FACILITY:  [facility]   PHYSICIAN:  R. Roetta Sessions, M.D.              DATE OF BIRTH:  1945-09-19   DATE OF PROCEDURE:  11/19/2002  DATE OF DISCHARGE:                                 OPERATIVE REPORT   PROCEDURE:  Colonoscopy with snare polypectomy.   INDICATIONS FOR PROCEDURE:  The patient is a 65 year old lady with a mildly  low hemoglobin, she is Hemoccult negative x3, colonoscopy is now being done  to further evaluate anemia in part for screening as she has not had one  previously. This approach has been discussed with the patient previously.  The risks, benefits, and alternatives have been reviewed. Please see my  dictated H&P for more information.   MONITORING:  O2 saturation, blood pressure, pulse and respirations were  monitored throughout the entire procedure.   CONSCIOUS SEDATION:  Demerol 100 mg IV, Versed 6 mg IV in divided doses. SBE  prophylaxis and ampicillin 2 g IV, gentamycin 120 mg IV.   INSTRUMENT:  Olympus video chip adult colonoscope.   FINDINGS:  Digital rectal exam revealed no abnormalities.   ENDOSCOPIC FINDINGS:  The prep was adequate.   RECTUM:  Examination of the rectal mucosa including retroflexed view of the  anal verge revealed three pedunculated polyps with the most proximal over  the most distal one 4 cm from the anal verge. There was a single 3 mm, 5 mm  and 7 mm polyp. The remainder of the rectal mucosa appeared normal.  These  three polyps were snared and recovered, otherwise, rectal mucosa appeared  normal.   COLON:  The colonic mucosa was surveyed from the rectosigmoid junction  through the left transverse right colon to the area of the appendiceal  orifice, ileocecal valve and cecum. These structures were well seen and  photographed for the record.  The patient was noted to have left sided  diverticula, the remainder of the colonic mucosa appeared normal. From the  level of the cecum and ileocecal valve, the scope was slowly withdrawn.  All  previously mentioned mucosal surfaces were again seen and again no other  abnormalities were observed. The patient tolerated the procedure well and  was reacted in endoscopy.   IMPRESSION:  Pedunculated rectal polyps snared/removed as described above,  left sided diverticula. The remainder of the colonic mucosa appeared normal.    RECOMMENDATIONS:  1. No aspirin or arthritis medication for 10 days.  2. Diverticulosis literature provided.  3. Followup on path.  Jonathon Bellows, M.D.    RMR/MEDQ  D:  11/19/2002  T:  11/19/2002  Job:  [id number]   cc:   Patrica Duel, M.D.  [address]  [place]  [place] [address]  Fax: [phone]

## 2010-11-03 ENCOUNTER — Other Ambulatory Visit: Payer: Self-pay | Admitting: Obstetrics and Gynecology

## 2010-11-03 DIAGNOSIS — Z139 Encounter for screening, unspecified: Secondary | ICD-10-CM

## 2010-11-13 ENCOUNTER — Ambulatory Visit (HOSPITAL_COMMUNITY)
Admission: RE | Admit: 2010-11-13 | Discharge: 2010-11-13 | Disposition: A | Discharge status: Home / Self Care | Payer: Self-pay | Source: Ambulatory Visit | Attending: Obstetrics and Gynecology | Admitting: Obstetrics and Gynecology

## 2010-11-13 DIAGNOSIS — Z139 Encounter for screening, unspecified: Secondary | ICD-10-CM

## 2010-11-19 ENCOUNTER — Other Ambulatory Visit: Payer: Self-pay | Admitting: Obstetrics and Gynecology

## 2010-11-19 DIAGNOSIS — R928 Other abnormal and inconclusive findings on diagnostic imaging of breast: Secondary | ICD-10-CM

## 2010-11-25 ENCOUNTER — Ambulatory Visit
Admission: RE | Admit: 2010-11-25 | Discharge: 2010-11-25 | Disposition: A | Discharge status: Home / Self Care | Payer: Self-pay | Source: Ambulatory Visit | Attending: Obstetrics and Gynecology | Admitting: Obstetrics and Gynecology

## 2010-11-25 ENCOUNTER — Other Ambulatory Visit: Payer: Self-pay | Admitting: Diagnostic Radiology

## 2010-11-25 ENCOUNTER — Other Ambulatory Visit: Payer: Self-pay | Admitting: Obstetrics and Gynecology

## 2010-11-25 ENCOUNTER — Ambulatory Visit
Admission: RE | Admit: 2010-11-25 | Discharge: 2010-11-25 | Disposition: A | Discharge status: Home / Self Care | Payer: No Typology Code available for payment source | Source: Ambulatory Visit | Attending: Obstetrics and Gynecology | Admitting: Obstetrics and Gynecology

## 2010-11-25 DIAGNOSIS — R928 Other abnormal and inconclusive findings on diagnostic imaging of breast: Secondary | ICD-10-CM

## 2011-01-06 ENCOUNTER — Emergency Department (HOSPITAL_COMMUNITY): Payer: Self-pay

## 2011-01-06 ENCOUNTER — Encounter: Payer: Self-pay | Admitting: Emergency Medicine

## 2011-01-06 ENCOUNTER — Emergency Department (HOSPITAL_COMMUNITY)
Admission: EM | Admit: 2011-01-06 | Discharge: 2011-01-06 | Disposition: A | Discharge status: Home / Self Care | Payer: Self-pay | Attending: Emergency Medicine | Admitting: Emergency Medicine

## 2011-01-06 ENCOUNTER — Other Ambulatory Visit: Payer: Self-pay

## 2011-01-06 DIAGNOSIS — IMO0002 Reserved for concepts with insufficient information to code with codable children: Secondary | ICD-10-CM | POA: Insufficient documentation

## 2011-01-06 DIAGNOSIS — I10 Essential (primary) hypertension: Secondary | ICD-10-CM | POA: Insufficient documentation

## 2011-01-06 DIAGNOSIS — I509 Heart failure, unspecified: Secondary | ICD-10-CM | POA: Insufficient documentation

## 2011-01-06 DIAGNOSIS — Z859 Personal history of malignant neoplasm, unspecified: Secondary | ICD-10-CM | POA: Insufficient documentation

## 2011-01-06 DIAGNOSIS — Y9229 Other specified public building as the place of occurrence of the external cause: Secondary | ICD-10-CM | POA: Insufficient documentation

## 2011-01-06 DIAGNOSIS — I4891 Unspecified atrial fibrillation: Secondary | ICD-10-CM | POA: Insufficient documentation

## 2011-01-06 DIAGNOSIS — W19XXXA Unspecified fall, initial encounter: Secondary | ICD-10-CM | POA: Insufficient documentation

## 2011-01-06 DIAGNOSIS — R55 Syncope and collapse: Secondary | ICD-10-CM | POA: Insufficient documentation

## 2011-01-06 DIAGNOSIS — E119 Type 2 diabetes mellitus without complications: Secondary | ICD-10-CM | POA: Insufficient documentation

## 2011-01-06 HISTORY — DX: Unspecified osteoarthritis, unspecified site: M19.90

## 2011-01-06 LAB — CBC
HCT: 34.6 % — ABNORMAL LOW (ref 36.0–46.0)
MCV: 89.4 fL (ref 78.0–100.0)
RBC: 3.87 MIL/uL (ref 3.87–5.11)
RDW: 16 % — ABNORMAL HIGH (ref 11.5–15.5)
WBC: 7.1 10*3/uL (ref 4.0–10.5)

## 2011-01-06 LAB — DIFFERENTIAL
Basophils Absolute: 0 10*3/uL (ref 0.0–0.1)
Lymphocytes Relative: 14 % (ref 12–46)
Lymphs Abs: 1 10*3/uL (ref 0.7–4.0)
Monocytes Absolute: 0.3 10*3/uL (ref 0.1–1.0)
Neutro Abs: 5.7 10*3/uL (ref 1.7–7.7)

## 2011-01-06 LAB — BASIC METABOLIC PANEL
BUN: 13 mg/dL (ref 6–23)
CO2: 25 mEq/L (ref 19–32)
Calcium: 9.8 mg/dL (ref 8.4–10.5)
Chloride: 103 mEq/L (ref 96–112)
Creatinine, Ser: 0.84 mg/dL (ref 0.50–1.10)
Glucose, Bld: 139 mg/dL — ABNORMAL HIGH (ref 70–99)

## 2011-01-06 LAB — TROPONIN I: Troponin I: <0.3 ng/mL (ref <0.30)

## 2011-01-06 LAB — PRO B NATRIURETIC PEPTIDE: Pro B Natriuretic peptide (BNP): 1206 pg/mL — ABNORMAL HIGH (ref 0–125)

## 2011-01-06 LAB — CK TOTAL AND CKMB (NOT AT ARMC): Total CK: 129 U/L (ref 7–177)

## 2011-01-06 MED ORDER — ACETAMINOPHEN 325 MG PO TABS
650.0000 mg | ORAL_TABLET | Freq: Once | ORAL | Status: AC
  Administered 2011-01-06: 650 mg via ORAL
  Filled 2011-01-06: qty 2

## 2011-01-06 NOTE — ED Notes (Signed)
Pt reports being at the grocery store a little while ago and having a syncopal episode.  Pt states that the health dept switched her bp medicine 6 mos ago.  Pt states that she has been having some "lightheadedness" at home since then.  Pt reports that when she "passed out" she hit her head on the concrete.  Pt has a small abrasion to the back of her head, bleeding in controlled.

## 2011-01-06 NOTE — ED Notes (Signed)
MD at bedside. 

## 2011-01-06 NOTE — ED Provider Notes (Signed)
History     Chief Complaint  Patient presents with  . Loss of Consciousness   Patient is a 65 y.o. female presenting with syncope. The history is provided by the patient. No language interpreter was used.  Loss of Consciousness This is a new problem. Episode onset: 1 hour prior to arrival. The problem occurs rarely. The problem has been resolved. Associated symptoms include headaches. Pertinent negatives include no chest pain, no abdominal pain and no shortness of breath. The symptoms are aggravated by nothing. The symptoms are relieved by nothing. She has tried nothing for the symptoms.  Patient began feeling lightheaded, described as "swimmy headed", and nauseated with syncopal episode following while putting groceries in the car approximately 1 hour prior to arrival. Patient now c/o new onset HA secondary to syncopal episode. Patient reports she had taken her blood pressure medications approximately 3 hours prior to onset of lightheadedness and syncopal episode today. Patient notes she was mildly confused as to the events that had just transpired after awaking from syncopal episode. Patient reports she has had increased increased SOB and lightheadedness with exertion the past several days. Denies history of syncopal episodes. Denies recent change in medications. Denies diaphoresis, palpitations, chest pain, vomiting  PCP- [facility]ologist- Dr. Dietrich Pates  Patient seen at 3:23 PM  Past Medical History  Diagnosis Date  . Diabetes mellitus   . Hypertension   . Arthritis   . CHF (congestive heart failure)   . Glaucoma   . Cancer     History reviewed. No pertinent past surgical history.  History reviewed. No pertinent family history.  History  Substance Use Topics  . Smoking status: Never Smoker   . Smokeless tobacco: Not on file  . Alcohol Use: No    OB History    Grav Para Term Preterm Abortions TAB SAB Ect Mult Living                  Review of  Systems  Constitutional: Negative for fever and diaphoresis.  Respiratory: Negative for shortness of breath.   Cardiovascular: Positive for syncope. Negative for chest pain and palpitations.  Gastrointestinal: Positive for nausea. Negative for vomiting and abdominal pain.  Neurological: Positive for syncope and headaches.  All other systems reviewed and are negative.  All other systems negative except as noted in HPI.    Physical Exam  BP 154/68  Pulse 94  Temp(Src) 98.5 F (36.9 C) (Oral)  Resp 20  Ht 5\' 7"  (1.702 m)  Wt 270 lb (122.471 kg)  BMI 42.29 kg/m2  SpO2 97%  Physical Exam  Nursing note and vitals reviewed. Constitutional: She is oriented to person, place, and time. She appears well-developed and well-nourished. No distress.       Hypertensive.  HENT:  Head: Normocephalic and atraumatic.  Right Ear: External ear normal.  Left Ear: External ear normal.  Mouth/Throat: Oropharynx is clear and moist.       Abrasion and small hematoma to right occiput. Dentition normal   Eyes: Conjunctivae are normal. Pupils are equal, round, and reactive to light.  Neck: Neck supple.  Cardiovascular: Normal rate, regular rhythm, S1 normal, S2 normal, normal heart sounds, intact distal pulses and normal pulses.  Exam reveals no gallop and no friction rub.   No murmur heard. Pulmonary/Chest: Effort normal and breath sounds normal. She has no wheezes.       CTA in all fields.   Abdominal: Soft. Bowel sounds are normal. She exhibits no distension.  There is no tenderness.  Musculoskeletal: Normal range of motion. She exhibits edema (trace amount bilaterally).  Neurological: She is alert and oriented to person, place, and time. No sensory deficit.  Skin: Skin is warm and dry.  Psychiatric: She has a normal mood and affect. Her behavior is normal.    ED Course  Procedures  MDM The patient presents with syncope prompting among other considerations, differential diagnoses of anemia,  arrhythmia, myocardial infarction, head injury, electrolyte abnormality, vasovagal syncope. 3:28 PM-Patient vital signs noted and evaluated in room. Heart Rate-85 , normal. Blood Pressure-131/61 mmHg, normal. Oxygen saturation 94% on room air, adequate level.  Results for orders placed during the hospital encounter of 01/06/11  GLUCOSE, CAPILLARY      Component Value Range   Glucose-Capillary 134 (*) 70 - 99 (mg/dL)  BASIC METABOLIC PANEL      Component Value Range   Sodium 143  135 - 145 (mEq/L)   Potassium 3.5  3.5 - 5.1 (mEq/L)   Chloride 103  96 - 112 (mEq/L)   CO2 25  19 - 32 (mEq/L)   Glucose, Bld 139 (*) 70 - 99 (mg/dL)   BUN 13  6 - 23 (mg/dL)   Creatinine, Ser 9.56  0.50 - 1.10 (mg/dL)   Calcium 9.8  8.4 - 21.3 (mg/dL)   GFR calc non Af Amer >60  >60 (mL/min)   GFR calc Af Amer >60  >60 (mL/min)  CK TOTAL AND CKMB      Component Value Range   Total CK 129  7 - 177 (U/L)   CK, MB 2.6  0.3 - 4.0 (ng/mL)   Relative Index 2.0  0.0 - 2.5   TROPONIN I      Component Value Range   Troponin I <0.30  <0.30 (ng/mL)  CBC      Component Value Range   WBC 7.1  4.0 - 10.5 (K/uL)   RBC 3.87  3.87 - 5.11 (MIL/uL)   Hemoglobin 10.7 (*) 12.0 - 15.0 (g/dL)   HCT 08.6 (*) 57.8 - 46.0 (%)   MCV 89.4  78.0 - 100.0 (fL)   MCH 27.6  26.0 - 34.0 (pg)   MCHC 30.9  30.0 - 36.0 (g/dL)   RDW 46.9 (*) 62.9 - 15.5 (%)   Platelets 168  150 - 400 (K/uL)  DIFFERENTIAL      Component Value Range   Neutrophils Relative 81 (*) 43 - 77 (%)   Neutro Abs 5.7  1.7 - 7.7 (K/uL)   Lymphocytes Relative 14  12 - 46 (%)   Lymphs Abs 1.0  0.7 - 4.0 (K/uL)   Monocytes Relative 4  3 - 12 (%)   Monocytes Absolute 0.3  0.1 - 1.0 (K/uL)   Eosinophils Relative 1  0 - 5 (%)   Eosinophils Absolute 0.1  0.0 - 0.7 (K/uL)   Basophils Relative 0  0 - 1 (%)   Basophils Absolute 0.0  0.0 - 0.1 (K/uL)  PRO B NATRIURETIC PEPTIDE      Component Value Range   BNP, POC 1206.0 (*) 0 - 125 (pg/mL)   Dg Chest 2  View  01/06/2011  *RADIOLOGY REPORT*  Clinical Data: Syncope  CHEST - 2 VIEW  Comparison: 10/02/2008  Findings: Cardiac enlargement .  No edema or effusion.  Negative for pneumonia.  Negative for mass lesion.  IMPRESSION: No active cardiopulmonary disease.  Original Report Authenticated By: Camelia Phenes, M.D.   Ct Head Wo Contrast  01/06/2011  *RADIOLOGY  REPORT*  Clinical Data: Fall.  Loss of consciousness.  CT HEAD WITHOUT CONTRAST  Technique:  Contiguous axial images were obtained from the base of the skull through the vertex without contrast.  Comparison: None.  Findings: Ventricle size is normal.  No intracranial hemorrhage. Negative for acute infarct or mass lesion.  Right parietal scalp hematoma is present.  Negative for skull fracture.  IMPRESSION: No acute intracranial abnormality.  Original Report Authenticated By: Camelia Phenes, M.D.    Chart written by Clarita Crane acting as scribe for Felisa Bonier, MD  I personally performed the services described in this documentation, which was scribed in my presence. The recorded information has been reviewed and considered. Felisa Bonier, MD   Date: 01/06/2011  Rate: 69  Rhythm: normal sinus rhythm  QRS Axis: normal  Intervals: PR prolonged  ST/T Wave abnormalities: normal  Conduction Disutrbances:none  Narrative Interpretation:   Old EKG Reviewed: none available   Felisa Bonier, MD 01/06/11 1729

## 2011-01-06 NOTE — ED Notes (Signed)
Pt states she was in grocery store parking lot became dizzy and  "passed out" was witnessed.  Checked sugar when she went home and was 100. Pt states she feels ok now. Did hit head when had loc. Has abrasion to r side of back of head. Pt did eat candy and soda after loc when she got home. deneis vomiting since incident but intermittant nausea.

## 2011-01-14 ENCOUNTER — Encounter: Payer: Self-pay | Admitting: Cardiology

## 2011-01-15 ENCOUNTER — Ambulatory Visit: Payer: Self-pay | Admitting: Cardiology

## 2011-01-27 ENCOUNTER — Encounter: Payer: Self-pay | Admitting: Cardiology

## 2011-01-27 ENCOUNTER — Ambulatory Visit (INDEPENDENT_AMBULATORY_CARE_PROVIDER_SITE_OTHER): Payer: Self-pay | Admitting: Cardiology

## 2011-01-27 VITALS — BP 147/81 | HR 73 | Ht 67.0 in | Wt 259.0 lb

## 2011-01-27 DIAGNOSIS — I110 Hypertensive heart disease with heart failure: Secondary | ICD-10-CM

## 2011-01-27 DIAGNOSIS — I10 Essential (primary) hypertension: Secondary | ICD-10-CM

## 2011-01-27 DIAGNOSIS — R55 Syncope and collapse: Secondary | ICD-10-CM | POA: Insufficient documentation

## 2011-01-27 DIAGNOSIS — I509 Heart failure, unspecified: Secondary | ICD-10-CM

## 2011-01-27 DIAGNOSIS — R0989 Other specified symptoms and signs involving the circulatory and respiratory systems: Secondary | ICD-10-CM

## 2011-01-27 NOTE — Assessment & Plan Note (Signed)
Blood pressure is elevated today. She is not orthostatic. I talked to her about 2 g sodium diet. She will [facility]. They can make adjustments as needed.

## 2011-01-27 NOTE — Assessment & Plan Note (Signed)
Patient had an episode of syncope while returning her grocery cart 3 weeks ago. Workup in the ER was negative except for hemoglobin of 10.7. She is having her CBC repeated today. Patient does have a carotid bruit, Systolic murmur, with history of moderate MR 2 years ago, and should have carotid Dopplers, 2-D echo, and stress Myoview. 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 She does not turned 66 until next year. She is quite tearful about this situation but truly cannot afford to have these tests performed.

## 2011-01-27 NOTE — Patient Instructions (Signed)
Your physician has requested that you have a carotid duplex. This test is an ultrasound of the carotid arteries in your neck. It looks at blood flow through these arteries that supply the brain with blood. Allow one hour for this exam. There are no restrictions or special instructions.  Your physician has requested that you have an echocardiogram. Echocardiography is a painless test that uses sound waves to create images of your heart. It provides your doctor with information about the size and shape of your heart and how well your heart's chambers and valves are working. This procedure takes approximately one hour. There are no restrictions for this procedure.  Your physician has requested that you have en exercise stress myoview. For further information please visit [email or web address] Please follow instruction sheet, as given.  Your physician recommends that you schedule a follow-up appointment in: 1 month
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
## 2011-02-19 ENCOUNTER — Encounter: Payer: Self-pay | Admitting: Cardiology

## 2011-02-23 ENCOUNTER — Ambulatory Visit: Payer: Self-pay | Admitting: Cardiology

## 2011-06-14 ENCOUNTER — Telehealth: Payer: Self-pay | Admitting: *Deleted

## 2011-06-14 ENCOUNTER — Telehealth: Payer: Self-pay | Admitting: Cardiology

## 2011-06-14 ENCOUNTER — Inpatient Hospital Stay (HOSPITAL_COMMUNITY)
Admission: EM | Admit: 2011-06-14 | Discharge: 2011-06-25 | DRG: 292 | Disposition: A | Discharge status: Home / Self Care | Payer: Medicaid Other | Attending: Internal Medicine | Admitting: Internal Medicine

## 2011-06-14 ENCOUNTER — Encounter (HOSPITAL_COMMUNITY): Payer: Self-pay | Admitting: Emergency Medicine

## 2011-06-14 ENCOUNTER — Other Ambulatory Visit: Payer: Self-pay

## 2011-06-14 ENCOUNTER — Emergency Department (HOSPITAL_COMMUNITY): Payer: Medicaid Other

## 2011-06-14 DIAGNOSIS — I110 Hypertensive heart disease with heart failure: Secondary | ICD-10-CM

## 2011-06-14 DIAGNOSIS — I5033 Acute on chronic diastolic (congestive) heart failure: Principal | ICD-10-CM

## 2011-06-14 DIAGNOSIS — I10 Essential (primary) hypertension: Secondary | ICD-10-CM

## 2011-06-14 DIAGNOSIS — E876 Hypokalemia: Secondary | ICD-10-CM | POA: Diagnosis not present

## 2011-06-14 DIAGNOSIS — Z6841 Body Mass Index (BMI) 40.0 and over, adult: Secondary | ICD-10-CM

## 2011-06-14 DIAGNOSIS — D696 Thrombocytopenia, unspecified: Secondary | ICD-10-CM

## 2011-06-14 DIAGNOSIS — D509 Iron deficiency anemia, unspecified: Secondary | ICD-10-CM

## 2011-06-14 DIAGNOSIS — H1130 Conjunctival hemorrhage, unspecified eye: Secondary | ICD-10-CM | POA: Diagnosis not present

## 2011-06-14 DIAGNOSIS — T45515A Adverse effect of anticoagulants, initial encounter: Secondary | ICD-10-CM | POA: Diagnosis not present

## 2011-06-14 DIAGNOSIS — R0602 Shortness of breath: Secondary | ICD-10-CM | POA: Diagnosis present

## 2011-06-14 DIAGNOSIS — R55 Syncope and collapse: Secondary | ICD-10-CM

## 2011-06-14 DIAGNOSIS — E1121 Type 2 diabetes mellitus with diabetic nephropathy: Secondary | ICD-10-CM | POA: Diagnosis present

## 2011-06-14 DIAGNOSIS — I509 Heart failure, unspecified: Secondary | ICD-10-CM | POA: Diagnosis present

## 2011-06-14 DIAGNOSIS — E875 Hyperkalemia: Secondary | ICD-10-CM | POA: Diagnosis present

## 2011-06-14 DIAGNOSIS — K7460 Unspecified cirrhosis of liver: Secondary | ICD-10-CM | POA: Diagnosis present

## 2011-06-14 DIAGNOSIS — E669 Obesity, unspecified: Secondary | ICD-10-CM | POA: Diagnosis present

## 2011-06-14 DIAGNOSIS — I50810 Right heart failure, unspecified: Secondary | ICD-10-CM

## 2011-06-14 DIAGNOSIS — E871 Hypo-osmolality and hyponatremia: Secondary | ICD-10-CM

## 2011-06-14 DIAGNOSIS — N179 Acute kidney failure, unspecified: Secondary | ICD-10-CM

## 2011-06-14 DIAGNOSIS — E119 Type 2 diabetes mellitus without complications: Secondary | ICD-10-CM

## 2011-06-14 DIAGNOSIS — H1132 Conjunctival hemorrhage, left eye: Secondary | ICD-10-CM | POA: Diagnosis not present

## 2011-06-14 DIAGNOSIS — R601 Generalized edema: Secondary | ICD-10-CM

## 2011-06-14 DIAGNOSIS — I4891 Unspecified atrial fibrillation: Secondary | ICD-10-CM

## 2011-06-14 HISTORY — DX: Iron deficiency anemia, unspecified: D50.9

## 2011-06-14 HISTORY — DX: Conjunctival hemorrhage, left eye: H11.32

## 2011-06-14 LAB — DIFFERENTIAL
Lymphs Abs: 1.6 10*3/uL (ref 0.7–4.0)
Monocytes Absolute: 0.6 10*3/uL (ref 0.1–1.0)
Monocytes Relative: 7 % (ref 3–12)
Neutro Abs: 6.4 10*3/uL (ref 1.7–7.7)
Neutrophils Relative %: 73 % (ref 43–77)

## 2011-06-14 LAB — COMPREHENSIVE METABOLIC PANEL
Alkaline Phosphatase: 62 U/L (ref 39–117)
BUN: 70 mg/dL — ABNORMAL HIGH (ref 6–23)
CO2: 22 mEq/L (ref 19–32)
Chloride: 98 mEq/L (ref 96–112)
GFR calc Af Amer: 21 mL/min — ABNORMAL LOW (ref >90)
GFR calc non Af Amer: 18 mL/min — ABNORMAL LOW (ref >90)
Glucose, Bld: 104 mg/dL — ABNORMAL HIGH (ref 70–99)
Potassium: 5.2 mEq/L — ABNORMAL HIGH (ref 3.5–5.1)
Total Bilirubin: 0.8 mg/dL (ref 0.3–1.2)

## 2011-06-14 LAB — URINALYSIS, ROUTINE W REFLEX MICROSCOPIC
Bilirubin Urine: NEGATIVE
Ketones, ur: NEGATIVE mg/dL
Nitrite: NEGATIVE
Protein, ur: NEGATIVE mg/dL
Urobilinogen, UA: 0.2 mg/dL (ref 0.0–1.0)
pH: 5.5 (ref 5.0–8.0)

## 2011-06-14 LAB — PRO B NATRIURETIC PEPTIDE: Pro B Natriuretic peptide (BNP): 1323 pg/mL — ABNORMAL HIGH (ref 0–125)

## 2011-06-14 LAB — CBC
HCT: 34.4 % — ABNORMAL LOW (ref 36.0–46.0)
Hemoglobin: 10.9 g/dL — ABNORMAL LOW (ref 12.0–15.0)
RBC: 3.77 MIL/uL — ABNORMAL LOW (ref 3.87–5.11)

## 2011-06-14 LAB — GLUCOSE, CAPILLARY

## 2011-06-14 LAB — CARDIAC PANEL(CRET KIN+CKTOT+MB+TROPI): Troponin I: <0.3 ng/mL (ref <0.30)

## 2011-06-14 MED ORDER — INSULIN ASPART 100 UNIT/ML ~~LOC~~ SOLN
0.0000 [IU] | Freq: Three times a day (TID) | SUBCUTANEOUS | Status: DC
  Administered 2011-06-15: 1 [IU] via SUBCUTANEOUS
  Administered 2011-06-15: 2 [IU] via SUBCUTANEOUS
  Administered 2011-06-16 (×2): 1 [IU] via SUBCUTANEOUS
  Administered 2011-06-16: 2 [IU] via SUBCUTANEOUS
  Administered 2011-06-17 (×2): 1 [IU] via SUBCUTANEOUS
  Administered 2011-06-18 – 2011-06-19 (×3): 2 [IU] via SUBCUTANEOUS
  Administered 2011-06-19: 1 [IU] via SUBCUTANEOUS
  Administered 2011-06-20 – 2011-06-24 (×9): 2 [IU] via SUBCUTANEOUS
  Administered 2011-06-24: 1 [IU] via SUBCUTANEOUS
  Administered 2011-06-24 – 2011-06-25 (×2): 2 [IU] via SUBCUTANEOUS
  Administered 2011-06-25: 1 [IU] via SUBCUTANEOUS
  Filled 2011-06-14: qty 3

## 2011-06-14 MED ORDER — ASPIRIN 81 MG PO CHEW
324.0000 mg | CHEWABLE_TABLET | Freq: Once | ORAL | Status: AC
  Administered 2011-06-14: 324 mg via ORAL
  Filled 2011-06-14: qty 4

## 2011-06-14 MED ORDER — FUROSEMIDE 10 MG/ML IJ SOLN
80.0000 mg | Freq: Once | INTRAMUSCULAR | Status: AC
  Administered 2011-06-14: 80 mg via INTRAVENOUS
  Filled 2011-06-14: qty 8

## 2011-06-14 MED ORDER — INSULIN ASPART 100 UNIT/ML ~~LOC~~ SOLN
0.0000 [IU] | Freq: Every day | SUBCUTANEOUS | Status: DC
  Administered 2011-06-20: 2 [IU] via SUBCUTANEOUS

## 2011-06-14 NOTE — ED Notes (Signed)
Patient with c/o shortness of breath with exertion. Patient reports recent weight gain. History of CHF. Patient with unlabored respirations at this time and is able to speak in complete sentence. Denies chest pain.

## 2011-06-14 NOTE — Telephone Encounter (Signed)
Received second phone call from patient stating that she is Dr Marvel Plan patient and wants to be seen today.  After gathering information, pt has gained 27 pounds since the beginning of month, has edema and is severely short of breath.  Has a significant history, therefore advised her that the ER would be a better treatment choice, as her condition appears to be to the point now where we will be unable to treat her needs in the clinic, as lab work, x rays and other treatment modalities would be necessary.  Patient and husband verbalized understanding and would follow advise.  Emergency department notified of her impending arrival.
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
## 2011-06-14 NOTE — Telephone Encounter (Signed)
THIS PT IS SEEN [facility] AND DR MCDOWELL    SHE HAS GAINED 20 LBS OVER THE LAST FEW WEEKS. SOB WHEN WALKING AND WANTS SEEN BY [place]. [facility] IS CLOS[facility] TODAY.
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
## 2011-06-14 NOTE — ED Notes (Signed)
Pt c/o shortness of breath. Pt states that she has gained 28 pounds in less than one month. Pt has edema in her abdomen and bilateral legs, feet and ankles. Pt c/o worsening dyspnea with exertion. Pt alert and oriented x 3. Skin warm and dry. Color pink. Breath sounds clear and equal bilaterally. CCM showing A fib. Pt denies history of A fib.

## 2011-06-14 NOTE — ED Notes (Signed)
Attempted to call report - receiving nurse states that she can not take report at this time. T.Yoder,RN made aware.

## 2011-06-14 NOTE — ED Notes (Signed)
Pt up to bedside commode. Pt does not want foley catheter at this time. CCM showing A fib. Pt has worsening shortness of breath with exertion but respirations deep and even at rest.

## 2011-06-14 NOTE — Telephone Encounter (Signed)
**Note De-Identified Krystal [name] Obfuscation** Pt. states that she has been swelling since 12/1 and that she has gained 27 lbs. She also states that she is SOB with and without exertion. Pt. advised to call 911 as Dr. Marvel Plan schedule is full and [name], NP is off today (Dr. Diona Browner is at [facility] office today). She verbalized understanding./LV

## 2011-06-14 NOTE — ED Notes (Signed)
Dr.campbell to see pt, report called to floor

## 2011-06-14 NOTE — ED Notes (Signed)
Pt sitting up on side of bed, voices no complaints, awaiting return call from floor for report.

## 2011-06-15 ENCOUNTER — Inpatient Hospital Stay (HOSPITAL_COMMUNITY): Payer: Medicaid Other

## 2011-06-15 LAB — GLUCOSE, CAPILLARY
Glucose-Capillary: 78 mg/dL (ref 70–99)
Glucose-Capillary: 149 mg/dL — ABNORMAL HIGH (ref 70–99)

## 2011-06-15 LAB — CBC
HCT: 33 % — ABNORMAL LOW (ref 36.0–46.0)
Hemoglobin: 10.5 g/dL — ABNORMAL LOW (ref 12.0–15.0)
MCHC: 31.8 g/dL (ref 30.0–36.0)
MCV: 89.9 fL (ref 78.0–100.0)

## 2011-06-15 LAB — COMPREHENSIVE METABOLIC PANEL
ALT: 6 U/L (ref 0–35)
Albumin: 3.7 g/dL (ref 3.5–5.2)
Alkaline Phosphatase: 60 U/L (ref 39–117)
Calcium: 10.2 mg/dL (ref 8.4–10.5)
GFR calc Af Amer: 24 mL/min — ABNORMAL LOW (ref >90)
Glucose, Bld: 87 mg/dL (ref 70–99)
Potassium: 4 mEq/L (ref 3.5–5.1)
Sodium: 137 mEq/L (ref 135–145)
Total Protein: 6.7 g/dL (ref 6.0–8.3)

## 2011-06-15 LAB — PROTIME-INR: INR: 1.32 (ref 0.00–1.49)

## 2011-06-15 LAB — TSH: TSH: 2.95 u[IU]/mL (ref 0.350–4.500)

## 2011-06-15 MED ORDER — TRAZODONE HCL 50 MG PO TABS
25.0000 mg | ORAL_TABLET | Freq: Every evening | ORAL | Status: DC | PRN
  Administered 2011-06-24: 25 mg via ORAL
  Filled 2011-06-15: qty 1

## 2011-06-15 MED ORDER — ROSUVASTATIN CALCIUM 20 MG PO TABS
10.0000 mg | ORAL_TABLET | Freq: Every day | ORAL | Status: DC
  Administered 2011-06-15 – 2011-06-24 (×10): 10 mg via ORAL
  Filled 2011-06-15 (×10): qty 1

## 2011-06-15 MED ORDER — ACETAMINOPHEN 650 MG RE SUPP: 650.0000 mg | Freq: Four times a day (QID) | RECTAL | Status: DC | PRN

## 2011-06-15 MED ORDER — LABETALOL HCL 100 MG PO TABS
450.0000 mg | ORAL_TABLET | Freq: Two times a day (BID) | ORAL | Status: DC
  Administered 2011-06-15 – 2011-06-19 (×7): 450 mg via ORAL
  Filled 2011-06-15: qty 4.5
  Filled 2011-06-15 (×2): qty 1.5
  Filled 2011-06-15 (×7): qty 4.5
  Filled 2011-06-15 (×2): qty 1.5
  Filled 2011-06-15 (×2): qty 4.5

## 2011-06-15 MED ORDER — PATIENT'S GUIDE TO USING COUMADIN BOOK
Freq: Once | Status: AC
  Administered 2011-06-15: 12:00:00
  Filled 2011-06-15: qty 1

## 2011-06-15 MED ORDER — SODIUM CHLORIDE 0.9 % IV SOLN: INTRAVENOUS | Status: DC

## 2011-06-15 MED ORDER — WARFARIN VIDEO: Freq: Once | Status: DC

## 2011-06-15 MED ORDER — VERAPAMIL HCL ER 180 MG PO TBCR
360.0000 mg | EXTENDED_RELEASE_TABLET | ORAL | Status: DC
  Administered 2011-06-15 – 2011-06-23 (×8): 360 mg via ORAL
  Filled 2011-06-15 (×8): qty 2

## 2011-06-15 MED ORDER — ONDANSETRON HCL 4 MG/2ML IJ SOLN
4.0000 mg | Freq: Four times a day (QID) | INTRAMUSCULAR | Status: DC | PRN
  Administered 2011-06-24: 4 mg via INTRAVENOUS
  Filled 2011-06-15: qty 2

## 2011-06-15 MED ORDER — HYDROMORPHONE HCL PF 1 MG/ML IJ SOLN: 0.5000 mg | INTRAMUSCULAR | Status: DC | PRN

## 2011-06-15 MED ORDER — POLYETHYLENE GLYCOL 3350 17 G PO PACK: 17.0000 g | PACK | Freq: Every day | ORAL | Status: DC | PRN

## 2011-06-15 MED ORDER — BISACODYL 10 MG RE SUPP: 10.0000 mg | Freq: Every day | RECTAL | Status: DC | PRN

## 2011-06-15 MED ORDER — GLIPIZIDE ER 5 MG PO TB24
5.0000 mg | ORAL_TABLET | ORAL | Status: DC
  Administered 2011-06-15 – 2011-06-23 (×9): 5 mg via ORAL
  Filled 2011-06-15 (×10): qty 1

## 2011-06-15 MED ORDER — ONDANSETRON HCL 4 MG PO TABS
4.0000 mg | ORAL_TABLET | Freq: Four times a day (QID) | ORAL | Status: DC | PRN
  Administered 2011-06-21: 4 mg via ORAL
  Filled 2011-06-15: qty 1

## 2011-06-15 MED ORDER — WARFARIN SODIUM 7.5 MG PO TABS
7.5000 mg | ORAL_TABLET | Freq: Once | ORAL | Status: AC
  Administered 2011-06-15: 7.5 mg via ORAL
  Filled 2011-06-15: qty 1

## 2011-06-15 MED ORDER — SODIUM CHLORIDE 0.9 % IJ SOLN
INTRAMUSCULAR | Status: AC
  Filled 2011-06-15: qty 3

## 2011-06-15 MED ORDER — FUROSEMIDE 10 MG/ML IJ SOLN
40.0000 mg | Freq: Two times a day (BID) | INTRAMUSCULAR | Status: DC
  Administered 2011-06-15 – 2011-06-16 (×3): 40 mg via INTRAVENOUS
  Filled 2011-06-15 (×3): qty 4

## 2011-06-15 MED ORDER — CLONIDINE HCL 0.2 MG PO TABS
0.2000 mg | ORAL_TABLET | Freq: Two times a day (BID) | ORAL | Status: DC
  Administered 2011-06-15 – 2011-06-19 (×8): 0.2 mg via ORAL
  Filled 2011-06-15 (×11): qty 1

## 2011-06-15 MED ORDER — SIMVASTATIN 20 MG PO TABS: 40.0000 mg | ORAL_TABLET | Freq: Every day | ORAL | Status: DC

## 2011-06-15 MED ORDER — ENOXAPARIN SODIUM 100 MG/ML ~~LOC~~ SOLN
200.0000 mg | SUBCUTANEOUS | Status: DC
  Administered 2011-06-15 – 2011-06-21 (×7): 200 mg via SUBCUTANEOUS
  Filled 2011-06-15 (×7): qty 2

## 2011-06-15 MED ORDER — ACETAMINOPHEN 325 MG PO TABS: 650.0000 mg | ORAL_TABLET | Freq: Four times a day (QID) | ORAL | Status: DC | PRN
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
## 2011-06-15 NOTE — Progress Notes (Signed)
Subjective: Not Sob as long as laying in bed, dyspnea on exertion present, 30lbs wt gain in 4weeks  Objective: Vital signs in last 24 hours: Temp:  [97.5 F (36.4 C)-98.1 F (36.7 C)] 97.8 F (36.6 C) (01/01 0607) Pulse Rate:  [60-80] 80  (01/01 0607) Resp:  [18-25] 18  (01/01 0607) BP: (98-128)/(54-76) 106/58 mmHg (01/01 0607) SpO2:  [93 %-100 %] 93 % (01/01 0607) Weight:  [131.543 kg (290 lb)-132 kg (291 lb 0.1 oz)] 291 lb 0.1 oz (132 kg) (01/01 9604) Weight change:  Last BM Date: 06/13/11  Intake/Output from previous day: 12/31 0701 - 01/01 0700 In: -  Out: 1100 [Urine:1100]    Physical Exam: General: Alert, awake, oriented x3, in no acute distress. HEENT: No bruits, no goiter. Heart: Regular rate and rhythm, without murmurs, rubs, gallops. Lungs: Clear to auscultation bilaterally. Abdomen: Soft, nontender, distended, positive bowel sounds, questionable fluid thrill Extremities: 2plus pitting edema Neuro: Grossly intact, nonfocal.   Lab Results: Basic Metabolic Panel:  Basename 06/15/11 0622 06/14/11 1606  NA 137 133*  K 4.0 5.2*  CL 103 98  CO2 23 22  GLUCOSE 87 104*  BUN 68* 70*  CREATININE 2.38* 2.60*  CALCIUM 10.2 10.1  MG -- --  PHOS -- --   Liver Function Tests:  [facility] 06/15/11 0622 06/14/11 1606  AST 14 15  ALT 6 7  ALKPHOS 60 62  BILITOT 0.7 0.8  PROT 6.7 6.9  ALBUMIN 3.7 3.7   No results found for this basename: LIPASE:2,AMYLASE:2 in the last 72 hours No results found for this basename: AMMONIA:2 in the last 72 hours CBC:  Basename 06/15/11 0622 06/14/11 1606  WBC 6.0 8.6  NEUTROABS -- 6.4  HGB 10.5* 10.9*  HCT 33.0* 34.4*  MCV 89.9 91.2  PLT 159 203   Cardiac Enzymes:  Basename 06/14/11 1606  CKTOTAL 67  CKMB 2.8  CKMBINDEX --  TROPONINI <0.30   BNP:  Basename 06/15/11 0623 06/14/11 1606  PROBNP 1455.0* 1323.0*   D-Dimer: No results found for this basename: DDIMER:2 in the last 72 hours CBG:  Basename 06/15/11 0812  06/14/11 2243  GLUCAP 78 82   Hemoglobin A1C: No results found for this basename: HGBA1C in the last 72 hours Fasting Lipid Panel: No results found for this basename: CHOL,HDL,LDLCALC,TRIG,CHOLHDL,LDLDIRECT in the last 72 hours Thyroid Function Tests: No results found for this basename: TSH,T4TOTAL,FREET4,T3FREE,THYROIDAB in the last 72 hours Anemia Panel: No results found for this basename: VITAMINB12,FOLATE,FERRITIN,TIBC,IRON,RETICCTPCT in the last 72 hours Coagulation:  Basename 06/15/11 0622  LABPROT 16.6*  INR 1.32   Urine Drug Screen: Drugs of Abuse  No results found for this basename: labopia, cocainscrnur, labbenz, amphetmu, thcu, labbarb    Alcohol Level: No results found for this basename: ETH:2 in the last 72 hours  No results found for this or any previous visit (from the past 240 hour(s)).  Studies/Results: Dg Chest Port 1 View  06/14/2011  *RADIOLOGY REPORT*  Clinical Data: Shortness of breath, hypertension, history of uterine cancer  PORTABLE CHEST - 1 VIEW  Comparison: 01/06/2011  Findings: Cardiomegaly again noted.  No acute infiltrate or pleural effusion.  No pulmonary edema.  IMPRESSION: Cardiomegaly again noted.  No active disease.  Original Report Authenticated By: Natasha Mead, M.D.    Medications: Scheduled Meds:   . aspirin  324 mg Oral Once  . cloNIDine  0.2 mg Oral BID  . enoxaparin (LOVENOX) injection  200 mg Subcutaneous Q24H  . furosemide  40 mg Intravenous Q12H  .  furosemide  80 mg Intravenous Once  . glipiZIDE  5 mg Oral Q0700  . insulin aspart  0-5 Units Subcutaneous QHS  . insulin aspart  0-9 Units Subcutaneous TID WC  . labetalol  450 mg Oral BID  . patient's guide to using coumadin book   Does not apply Once  . rosuvastatin  10 mg Oral q1800  . verapamil  360 mg Oral Q0700  . warfarin  7.5 mg Oral ONCE-1800  . warfarin   Does not apply Once  . DISCONTD: simvastatin  40 mg Oral q1800   Continuous Infusions:   . DISCONTD: sodium  chloride     PRN Meds:.acetaminophen, acetaminophen, bisacodyl, HYDROmorphone, ondansetron (ZOFRAN) IV, ondansetron, polyethylene glycol, traZODone  Assessment/Plan: 1. 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 Acute renal failure-suspect cardio-renal, with volume overload,worsened by ARB,  stop IVF, get renal ultrasound, diurese and watch urine output and kidney function closely, renal consulted by admitter yesterday 4.  DIABETES MELLITUS, TYPE II: stable continue glipizide and SSI 5. OBESITY 6.  Atrial fibrillation rate controlled continue labetalol and coumadin    LOS: 1 day   Julus Kelley [facility] Pager: [phone] 06/15/2011, 9:35 AM

## 2011-06-16 DIAGNOSIS — I4891 Unspecified atrial fibrillation: Secondary | ICD-10-CM

## 2011-06-16 DIAGNOSIS — R0602 Shortness of breath: Secondary | ICD-10-CM

## 2011-06-16 DIAGNOSIS — R609 Edema, unspecified: Secondary | ICD-10-CM

## 2011-06-16 DIAGNOSIS — I059 Rheumatic mitral valve disease, unspecified: Secondary | ICD-10-CM

## 2011-06-16 LAB — BASIC METABOLIC PANEL
CO2: 24 mEq/L (ref 19–32)
Chloride: 103 mEq/L (ref 96–112)
Creatinine, Ser: 2.49 mg/dL — ABNORMAL HIGH (ref 0.50–1.10)
Glucose, Bld: 124 mg/dL — ABNORMAL HIGH (ref 70–99)

## 2011-06-16 LAB — HEMOGLOBIN A1C
Hgb A1c MFr Bld: 6.7 % — ABNORMAL HIGH (ref <5.7)
Mean Plasma Glucose: 146 mg/dL — ABNORMAL HIGH (ref <117)

## 2011-06-16 LAB — GLUCOSE, CAPILLARY
Glucose-Capillary: 128 mg/dL — ABNORMAL HIGH (ref 70–99)
Glucose-Capillary: 129 mg/dL — ABNORMAL HIGH (ref 70–99)

## 2011-06-16 LAB — CBC
HCT: 31.8 % — ABNORMAL LOW (ref 36.0–46.0)
Hemoglobin: 9.9 g/dL — ABNORMAL LOW (ref 12.0–15.0)
MCH: 28.4 pg (ref 26.0–34.0)
MCV: 91.1 fL (ref 78.0–100.0)
Platelets: 152 10*3/uL (ref 150–400)
RBC: 3.49 MIL/uL — ABNORMAL LOW (ref 3.87–5.11)
WBC: 5.5 10*3/uL (ref 4.0–10.5)

## 2011-06-16 MED ORDER — METOLAZONE 5 MG PO TABS
2.5000 mg | ORAL_TABLET | Freq: Two times a day (BID) | ORAL | Status: DC
  Administered 2011-06-16: 2.5 mg via ORAL
  Administered 2011-06-17: 11:00:00 via ORAL
  Administered 2011-06-17 – 2011-06-20 (×5): 2.5 mg via ORAL
  Filled 2011-06-16 (×8): qty 1

## 2011-06-16 MED ORDER — FUROSEMIDE 10 MG/ML IJ SOLN
40.0000 mg | Freq: Three times a day (TID) | INTRAMUSCULAR | Status: DC
  Administered 2011-06-16: 40 mg via INTRAVENOUS
  Filled 2011-06-16 (×2): qty 4

## 2011-06-16 MED ORDER — WARFARIN SODIUM 10 MG PO TABS
10.0000 mg | ORAL_TABLET | Freq: Once | ORAL | Status: AC
  Administered 2011-06-16: 10 mg via ORAL
  Filled 2011-06-16: qty 1

## 2011-06-16 NOTE — Progress Notes (Signed)
Subjective: Overall breathing much better since when she was first admitted. No dyspnea as long as she is not getting up and ambulating. No chest pain.  Objective: Vital signs in last 24 hours: Temp:  [97.7 F (36.5 C)-97.9 F (36.6 C)] 97.9 F (36.6 C) (01/02 1044) Pulse Rate:  [65-73] 65  (01/02 1044) Resp:  [20] 20  (01/02 1044) BP: (92-115)/(56-71) 92/56 mmHg (01/02 1044) SpO2:  [93 %-96 %] 95 % (01/02 1044) Weight:  [133.3 kg (293 lb 14 oz)] 293 lb 14 oz (133.3 kg) (01/02 0555) Weight change: 1.757 kg (3 lb 14 oz) Last BM Date: 06/16/11  Intake/Output from previous day: 01/01 0701 - 01/02 0700 In: 240 [P.O.:240] Out: 1200 [Urine:1200] Total I/O In: 372 [P.O.:360; IV Piggyback:12] Out: -   Physical Exam: General: Alert, awake, oriented x3, in no acute distress. HEENT: No bruits, no goiter. Heart: Regular rate and rhythm, without murmurs, rubs, gallops., Soft 2/6 systolic ejection murmur Lungs: Clear to auscultation bilaterally. Abdomen: Soft, nontender, distended, positive bowel sounds, questionable fluid thrill Extremities: 2plus pitting edema Neuro: Grossly intact, nonfocal.   Lab Results: Basic Metabolic Panel:  Basename 06/16/11 0509 06/15/11 0622  NA 137 137  K 4.1 4.0  CL 103 103  CO2 24 23  GLUCOSE 124* 87  BUN 68* 68*  CREATININE 2.49* 2.38*  CALCIUM 9.9 10.2  MG -- --  PHOS -- --   Liver Function Tests:  [facility] 06/15/11 0622 06/14/11 1606  AST 14 15  ALT 6 7  ALKPHOS 60 62  BILITOT 0.7 0.8  PROT 6.7 6.9  ALBUMIN 3.7 3.7   CBC:  Basename 06/16/11 0509 06/15/11 0622 06/14/11 1606  WBC 5.5 6.0 --  NEUTROABS -- -- 6.4  HGB 9.9* 10.5* --  HCT 31.8* 33.0* --  MCV 91.1 89.9 --  PLT 152 159 --   Cardiac Enzymes:  Basename 06/14/11 1606  CKTOTAL 67  CKMB 2.8  CKMBINDEX --  TROPONINI <0.30   BNP:  Basename 06/15/11 0623 06/14/11 1606  PROBNP 1455.0* 1323.0*   D-Dimer: No results found for this basename: DDIMER:2 in the last 72  hours CBG:  Basename 06/16/11 1701 06/16/11 1149 06/16/11 0744 06/15/11 2057 06/15/11 1720 06/15/11 1254  GLUCAP 156* 154* 128* 164* 149* 168*   Hemoglobin A1C:  Basename 06/15/11 0622  HGBA1C 6.7*   Thyroid Function Tests:  Basename 06/15/11 0622  TSH 2.950  T4TOTAL --  FREET4 --  T3FREE --  THYROIDAB --   Coagulation:  Basename 06/16/11 0509 06/15/11 0622  LABPROT 17.2* 16.6*  INR 1.38 1.32  Studies/Results: US Abdomen Complete  06/15/2011    IMPRESSION:  1.  Possible cirrhosis with associated mild ascites and bilateral pleural effusions. 2.  Nonspecific gallbladder wall thickening.  This may be related to liver disease and/or incomplete fasting.  No gallstones are identified.  There is no sonographic Murphy's sign or biliary dilatation. 3.  No hydronephrosis.  Medications: Scheduled Meds:    . cloNIDine  0.2 mg Oral BID  . enoxaparin (LOVENOX) injection  200 mg Subcutaneous Q24H  . furosemide  40 mg Intravenous TID  . glipiZIDE  5 mg Oral Q0700  . insulin aspart  0-5 Units Subcutaneous QHS  . insulin aspart  0-9 Units Subcutaneous TID WC  . labetalol  450 mg Oral BID  . metolazone  2.5 mg Oral BID  . rosuvastatin  10 mg Oral q1800  . sodium chloride      . verapamil  360 mg Oral Q0700  .  warfarin  10 mg Oral ONCE-1800  . warfarin   Does not apply Once  . DISCONTD: furosemide  40 mg Intravenous Q12H   Continuous Infusions:  PRN Meds:.acetaminophen, acetaminophen, bisacodyl, HYDROmorphone, ondansetron (ZOFRAN) IV, ondansetron, polyethylene glycol, traZODone  Assessment/Plan: 1. Acute Diastolic CHF/Right heart failure with  Volume overload- Given 30lbs wt gain in 4 weeks, i do not think she's intravascularly dry, and based on normal albumin not third spacing either, will stop her IVF, start IV lasix 40mg  BID, follow I/Os and creatinine. She's not diuresing very much today. Cardiology and nephrology following. So this is certainly related to liver and ascites.  3.  Acute renal failure-suspect cardio-renal, with volume overload,worsened by ARB,  stop IVF, get renal ultrasound, diurese and watch urine output and kidney function closely. Appreciate nephrology consult.  4.  DIABETES MELLITUS, TYPE II: stable continue glipizide and SSI CBG stable.  5. OBESITY  6.  Atrial fibrillation rate controlled continue labetalol and coumadin. INR still subtherapeutic  Chronic liver disease: Workup in progress checking hepatitis panel.    LOS: 2 days   Hollice Espy [facility] Pager: [phone] 06/15/2011, 6:53 PM
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
## 2011-06-16 NOTE — Progress Notes (Signed)
*  PRELIMINARY RESULTS* Echocardiogram 2D Echocardiogram has been performed.  Conrad [place] 06/16/2011, 11:24 AM

## 2011-06-17 ENCOUNTER — Inpatient Hospital Stay (HOSPITAL_COMMUNITY): Payer: Medicaid Other

## 2011-06-17 DIAGNOSIS — I10 Essential (primary) hypertension: Secondary | ICD-10-CM

## 2011-06-17 LAB — PRO B NATRIURETIC PEPTIDE: Pro B Natriuretic peptide (BNP): 1894 pg/mL — ABNORMAL HIGH (ref 0–125)

## 2011-06-17 LAB — PROTIME-INR: Prothrombin Time: 17.1 seconds — ABNORMAL HIGH (ref 11.6–15.2)

## 2011-06-17 LAB — IRON AND TIBC: UIBC: 340 ug/dL (ref 125–400)

## 2011-06-17 LAB — GLUCOSE, CAPILLARY
Glucose-Capillary: 129 mg/dL — ABNORMAL HIGH (ref 70–99)
Glucose-Capillary: 124 mg/dL — ABNORMAL HIGH (ref 70–99)

## 2011-06-17 LAB — BASIC METABOLIC PANEL
BUN: 68 mg/dL — ABNORMAL HIGH (ref 6–23)
Calcium: 9.7 mg/dL (ref 8.4–10.5)
Creatinine, Ser: 2.34 mg/dL — ABNORMAL HIGH (ref 0.50–1.10)
GFR calc Af Amer: 24 mL/min — ABNORMAL LOW (ref >90)
GFR calc non Af Amer: 21 mL/min — ABNORMAL LOW (ref >90)

## 2011-06-17 LAB — CBC
HCT: 31 % — ABNORMAL LOW (ref 36.0–46.0)
Hemoglobin: 9.7 g/dL — ABNORMAL LOW (ref 12.0–15.0)
MCH: 28.5 pg (ref 26.0–34.0)
MCHC: 31.3 g/dL (ref 30.0–36.0)
RDW: 15.4 % (ref 11.5–15.5)

## 2011-06-17 LAB — PHOSPHORUS: Phosphorus: 5.2 mg/dL — ABNORMAL HIGH (ref 2.3–4.6)

## 2011-06-17 LAB — FERRITIN: Ferritin: 63 ng/mL (ref 10–291)

## 2011-06-17 MED ORDER — TORSEMIDE 20 MG PO TABS
50.0000 mg | ORAL_TABLET | Freq: Every day | ORAL | Status: DC
  Administered 2011-06-17 – 2011-06-21 (×5): 50 mg via ORAL
  Filled 2011-06-17 (×5): qty 3

## 2011-06-17 MED ORDER — WARFARIN SODIUM 10 MG PO TABS
10.0000 mg | ORAL_TABLET | Freq: Once | ORAL | Status: AC
  Administered 2011-06-17: 10 mg via ORAL
  Filled 2011-06-17: qty 1

## 2011-06-17 NOTE — Progress Notes (Addendum)
SUBJECTIVE:Breathing better, no chest pain. Edema is better.  Active Problems:  DIABETES MELLITUS, TYPE II  OBESITY  DYSPNEA  Renal failure, acute  Atrial fibrillation  Right heart failure  HTN (hypertension)   LABS: Basic Metabolic Panel:  Basename 06/17/11 0534 06/16/11 0509  NA 140 137  K 3.9 4.1  CL 104 103  CO2 24 24  GLUCOSE 140* 124*  BUN 68* 68*  CREATININE 2.34* 2.49*  CALCIUM 9.7 9.9  MG -- --  PHOS 5.2* --   Liver Function Tests:  [facility] 06/15/11 0622 06/14/11 1606  AST 14 15  ALT 6 7  ALKPHOS 60 62  BILITOT 0.7 0.8  PROT 6.7 6.9  ALBUMIN 3.7 3.7   CBC:  Basename 06/17/11 0534 06/16/11 0509 06/14/11 1606  WBC 6.0 5.5 --  NEUTROABS -- -- 6.4  HGB 9.7* 9.9* --  HCT 31.0* 31.8* --  MCV 91.2 91.1 --  PLT 153 152 --   Cardiac Enzymes:  Basename 06/14/11 1606  CKTOTAL 67  CKMB 2.8  CKMBINDEX --  TROPONINI <0.30   Hemoglobin A1C:  Basename 06/15/11 0622  HGBA1C 6.7*   Fasting Lipid Panel: No results found for this basename: CHOL,HDL,LDLCALC,TRIG,CHOLHDL,LDLDIRECT in the last 72 hours Thyroid Function Tests:  Basename 06/15/11 0622  TSH 2.950  T4TOTAL --  T3FREE --  THYROIDAB --  ECHO: 06/16/2011 Left ventricle: The cavity size was normal. There was mild concentric hypertrophy. Systolic function was normal. The estimated ejection fraction was 60%. Wall motion was normal; there were no regional wall motion abnormalities. - Ventricular septum: The contour showed mild diastolic flattening. - Aortic valve: Mildly calcified annulus. Trileaflet. - Mitral valve: Mild regurgitation. - Left atrium: The atrium was moderately dilated. - Right ventricle: The cavity size was mildly to moderately dilated. Wall thickness was normal. - Right atrium: The atrium was moderately dilated. - Atrial septum: No defect or patent foramen ovale was identified. - Pericardium, extracardiac: A trivial pericardial effusion was identified posterior to the  heart. Impressions:   Compared to the prior study performed 10/23/2008, estimated RV systolic pressure has decreased to normal or at least near-normal.  RADIOLOGY:06/14/2011 Chest X-Ray Findings: Cardiomegaly again noted. No acute infiltrate or pleural effusion. No pulmonary edema. IMPRESSION: Cardiomegaly again noted. No active disease.  Abdominal Ultrasound: 06/15/2011 1. Possible cirrhosis with associated mild ascites and bilateral pleural effusions. 2. Nonspecific gallbladder wall thickening. This may be related to liver disease and/or incomplete fasting. No gallstones are identified. There is no sonographic Murphy's sign or biliary dilatation. 3. No hydronephrosis.  PHYSICAL EXAM BP 95/57  Pulse 74  Temp(Src) 97.7 F (36.5 C) (Oral)  Resp 18  Ht 5\' 7"  (1.702 m)  Wt 316 lb 4.8 oz (143.473 kg)  BMI 49.54 kg/m2  SpO2 95% General: Well developed, well nourished, in no acute distress Head: Eyes PERRLA, No xanthomas.   Normal cephalic and atramatic  Lungs: Clear bilaterally to auscultation and percussion. Heart: HRRR S1 S2, No MRG .  Pulses are 2+ & equal.            No carotid bruit. No JVD.  No abdominal bruits. No femoral bruits. Abdomen: Bowel sounds are positive, abdomen soft and non-tender without masses or                  Hernia's noted. Msk:  Back normal, normal gait. Normal strength and tone for age. Extremities: No clubbing, cyanosis 1+ bilateral edema.   Neuro: Alert and oriented X 3. Psych:  Good affect, responds  appropriately  TELEMETRY: Reviewed telemetry pt ZO:[id number] fib 60-70s. Weight: 143 kg, 10 kg more than the previous day, obviously an error. I&O: -1 L over 3 days and 1.8 L total since admission.  ASSESSMENT AND PLAN:  1. Acute on chronic diastolic CHF: Echocardiogram completed on 06/16/11 demonstrated normal LV function with right ventricular cavity mildly to moderately dilated both the left and right atrium were moderately dilated. Weight suggests   increase per recordings. However until this a.m. she was negative on I&O. Creatinine status has been stable at 2.34. She was given Demadex 50 mg by mouth daily along with one dose of metolazone this a.m. Clinical examination does not show significant fluid in the lower extremities and she expressed on admission. However she does have mild ascites and bilateral pleural effusions per ultrasound. Continue by mouth diuresis unless she begins to have more edema, which could be lymphedema, associated with ascites and obesity.  2. Atrial fibrillation: She is rate controlled currently on verapamil 360 mg daily along with labetalol 450 mg twice a day. She continues on Coumadin per pharmacy with regulation of this her PT/INR. INR is currently 1.37. She will need to followup as an outpatient in our Coumadin clinic for continued management of this. It is noted however that her hemoglobin is dropped from 10.5-9.7. We'll monitor this. Hemoccult stools.  3. Hypertension: Blood pressure is low normal running in the low 100s to high 90s systolic. She denies any dizziness. If she remains low with increased activity he may need to decrease clonidine to 0.1 milligrams twice a day. We'll asked for orthostatics every shift.   Bettey Mare. Lyman Bishop NP [facility] 06/17/2011, 10:52 AM   Cardiology Attending Patient interviewed and examined. Discussed with Joni Reining, NP.  Above note annotated and modified based upon my findings.  Weight is actually substantially decreased since last measured in August of this year. No significant diuresis since admission. Volume status is uncertain. BNP level is pending.  Echocardiogram suggests the presence of elevated right-sided pressure, which in turn could be caused by Pickwickian Syndrome. Arterial blood gas will be obtained.  Iron studies are consistent with iron deficiency anemia.  [place] Bing, MD 06/17/2011, 7:00 PM

## 2011-06-17 NOTE — Progress Notes (Signed)
Subjective: Interval History: has no complaint of difficulty breathing. She says that she's feeling better. She denies any nausea vomiting. Patient says that she is an episode of difficulty breathing yesterday but feels better since then. She denies any cough..  Objective: Vital signs in last 24 hours: Temp:  [97.7 F (36.5 C)-97.9 F (36.6 C)] 97.7 F (36.5 C) (01/03 0438) Pulse Rate:  [65-92] 74  (01/03 0438) Resp:  [18-20] 18  (01/03 0438) BP: (92-109)/(56-88) 95/57 mmHg (01/03 0438) SpO2:  [95 %-97 %] 95 % (01/03 0438) Weight:  [143.473 kg (316 lb 4.8 oz)] 316 lb 4.8 oz (143.473 kg) (01/03 0437) Weight change: 10.173 kg (22 lb 6.8 oz)  Intake/Output from previous day: 01/02 0701 - 01/03 0700 In: 376 [P.O.:360; IV Piggyback:16] Out: 1000 [Urine:1000] Intake/Output this shift: Total I/O In: 460 [P.O.:460] Out: -   General appearance: alert, cooperative and no distress Resp: diminished breath sounds bilaterally and dullness to percussion bilaterally Cardio: regular rate and rhythm, S1, S2 normal, no murmur, click, rub or gallop GI: Obesse, difficult to palpate organomegaly. She'll have any tenderness. Extremities: edema She has 2+ edema bilaterally.  Lab Results:  Basename 06/17/11 0534 06/16/11 0509  WBC 6.0 5.5  HGB 9.7* 9.9*  HCT 31.0* 31.8*  PLT 153 152   BMET:  Basename 06/17/11 0534 06/16/11 0509  NA 140 137  K 3.9 4.1  CL 104 103  CO2 24 24  GLUCOSE 140* 124*  BUN 68* 68*  CREATININE 2.34* 2.49*  CALCIUM 9.7 9.9   No results found for this basename: PTH:2 in the last 72 hours Iron Studies: No results found for this basename: IRON,TIBC,TRANSFERRIN,FERRITIN in the last 72 hours  Studies/Results: US Abdomen Complete  06/15/2011  *RADIOLOGY REPORT*  Clinical Data:  Acute renal failure with increasing abdominal girth.  Evaluate for hydronephrosis and ascites.  COMPLETE ABDOMINAL ULTRASOUND  Comparison:  None  Findings:  Examination is mildly limited by body  habitus.  In addition, the patient ate before the examination.  The gallbladder is contracted.  Gallbladder: The gallbladder is partly contracted.  There is probable gallbladder wall thickening to 5.5 mm.  No gallstones are identified.  Sonographic Murphy's sign is absent.  Common bile duct:   Normal in caliber without filling defects.  Liver:  The hepatic echogenicity is diffusely increased.  The liver contours appear slightly irregular.  No focal lesions are identified.  IVC:  Visualized portions appear unremarkable.  Pancreas:  Visualized portions appear unremarkable.Portions of the pancreas are obscured by bowel gas.  Spleen:  Visualized portions appear unremarkable.  Right Kidney:   The renal cortical thickness and echogenicity are preserved.  There is no hydronephrosis or focal abnormality. Renal length is 12.8 cm.  Left Kidney:   The renal cortical thickness and echogenicity are preserved.  There is no hydronephrosis or focal abnormality. Renal length is 12.5 cm.  Abdominal aorta:  Visualized portions appear unremarkable.  Small bilateral pleural effusions are noted.  There is a small amount of ascites, primarily adjacent to the liver.  IMPRESSION:  1.  Possible cirrhosis with associated mild ascites and bilateral pleural effusions. 2.  Nonspecific gallbladder wall thickening.  This may be related to liver disease and/or incomplete fasting.  No gallstones are identified.  There is no sonographic Murphy's sign or biliary dilatation. 3.  No hydronephrosis.  Original Report Authenticated By: Gerrianne Scale, M.D.    I have reviewed the patient's current medications.  Assessment/Plan: Problem #1 renal failure possibly acute on chronic  her BUN is 68 creatinine is 2.34 progressively improving. Problem #2 anasarca possibly related to chronic liver disease and hypertensive cardiovascular disease,  she is on diuretics none oliguric but still she has significant fluid overload. At this moment seems to be  improving. Problem #3 history of diabetes Problem #4 history of obesity  Problem #5 history of hypertension Problem #6 history of a trial fibrillation Problem #7 anemia Plan: Continue his metolazone           DC Lasix           Demadex 50 mg once a day           Basic metabolic panel in the morning and also iron studies.    LOS: 3 days   Tifanie Gardiner S 06/17/2011,9:33 AM

## 2011-06-17 NOTE — Progress Notes (Signed)
Subjective: Patient feeling good. Breathing much easier. No chest pain. States the swelling has gone down. Objective: Vital signs in last 24 hours: Temp:  [97.4 F (36.3 C)-98.8 F (37.1 C)] 98.8 F (37.1 C) (01/03 1812) Pulse Rate:  [66-92] 85  (01/03 1812) Resp:  [18-20] 20  (01/03 1812) BP: (95-154)/(57-88) 136/76 mmHg (01/03 1812) SpO2:  [92 %-98 %] 97 % (01/03 1812) Weight:  [143.473 kg (316 lb 4.8 oz)] 316 lb 4.8 oz (143.473 kg) (01/03 0437) Weight change: 10.173 kg (22 lb 6.8 oz) Last BM Date: 06/16/11  Intake/Output from previous day: 01/02 0701 - 01/03 0700 In: 616 [P.O.:600; IV Piggyback:16] Out: 1000 [Urine:1000] Total I/O In: 660 [P.O.:660] Out: -   Physical Exam: General: Alert, awake, oriented x3, in no acute distress. HEENT: No bruits, no goiter. Heart: Regular rate and rhythm, without murmurs, rubs, gallops., Soft 2/6 systolic ejection murmur Lungs: Clear to auscultation bilaterally. Abdomen: Soft, nontender, distended, positive bowel sounds, questionable fluid thrill Extremities: 1+ pitting edema bilaterally Neuro: Grossly intact, nonfocal.   Lab Results: Basic Metabolic Panel:  Basename 06/17/11 0534 06/16/11 0509  NA 140 137  K 3.9 4.1  CL 104 103  CO2 24 24  GLUCOSE 140* 124*  BUN 68* 68*  CREATININE 2.34* 2.49*  CALCIUM 9.7 9.9  MG -- --  PHOS 5.2* --   Liver Function Tests:  [facility] 06/15/11 0622  AST 14  ALT 6  ALKPHOS 60  BILITOT 0.7  PROT 6.7  ALBUMIN 3.7   CBC:  Basename 06/17/11 0534 06/16/11 0509  WBC 6.0 5.5  NEUTROABS -- --  HGB 9.7* 9.9*  HCT 31.0* 31.8*  MCV 91.2 91.1  PLT 153 152   BNP:  Basename 06/15/11 0623  PROBNP 1455.0*   CBG:  Basename 06/17/11 1602 06/17/11 1127 06/17/11 0749 06/16/11 2239 06/16/11 1701 06/16/11 1149  GLUCAP 129* 124* 119* 129* 156* 154*   Hemoglobin A1C:  Basename 06/15/11 0622  HGBA1C 6.7*   Thyroid Function Tests:  Basename 06/15/11 0622  TSH 2.950  T4TOTAL --  FREET4  --  T3FREE --  THYROIDAB --   Coagulation:  Basename 06/17/11 0534 06/16/11 0509  LABPROT 17.1* 17.2*  INR 1.37 1.38  Studies/Results:   Medications: Scheduled Meds:    . cloNIDine  0.2 mg Oral BID  . enoxaparin (LOVENOX) injection  200 mg Subcutaneous Q24H  . glipiZIDE  5 mg Oral Q0700  . insulin aspart  0-5 Units Subcutaneous QHS  . insulin aspart  0-9 Units Subcutaneous TID WC  . labetalol  450 mg Oral BID  . metolazone  2.5 mg Oral BID  . rosuvastatin  10 mg Oral q1800  . torsemide  50 mg Oral Daily  . verapamil  360 mg Oral Q0700  . warfarin  10 mg Oral ONCE-1800  . warfarin   Does not apply Once  . DISCONTD: furosemide  40 mg Intravenous TID   Continuous Infusions:  PRN Meds:.acetaminophen, acetaminophen, bisacodyl, HYDROmorphone, ondansetron (ZOFRAN) IV, ondansetron, polyethylene glycol, traZODone  Assessment/Plan: 1. Acute Diastolic CHF/Right heart failure with  Volume overload-patient's echocardiogram noted normal left ventricular function with dilation of the right ventricle and bilateral atria. Patient creatinine continues to improve, although by record she's not had much of diuresis today. Much of this may be more related to ascites and liver disease.  3. Acute renal failure-suspect cardio-renal, with volume overload,worsened by ARB,  Continues to improve.  4.  DIABETES MELLITUS, TYPE II: stable continue glipizide and SSI CBG stable.  5.  OBESITY  6.  Atrial fibrillation rate controlled continue labetalol and coumadin. INR still subtherapeutic  Chronic liver disease: Workup in progress , hepatitis panel was negative. We'll follow up with outpatient GI appointment.    LOS: 3 days   Hollice Espy [facility] Pager: [phone] 06/15/2011, 6:33 PM
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
## 2011-06-18 LAB — BASIC METABOLIC PANEL
Calcium: 9.7 mg/dL (ref 8.4–10.5)
GFR calc Af Amer: 27 mL/min — ABNORMAL LOW (ref >90)
GFR calc non Af Amer: 23 mL/min — ABNORMAL LOW (ref >90)
Potassium: 3.3 mEq/L — ABNORMAL LOW (ref 3.5–5.1)
Sodium: 141 mEq/L (ref 135–145)

## 2011-06-18 LAB — FERRITIN: Ferritin: 69 ng/mL (ref 10–291)

## 2011-06-18 LAB — CBC
MCH: 29 pg (ref 26.0–34.0)
MCHC: 31.8 g/dL (ref 30.0–36.0)
Platelets: 141 10*3/uL — ABNORMAL LOW (ref 150–400)
RDW: 15.2 % (ref 11.5–15.5)

## 2011-06-18 LAB — BLOOD GAS, ARTERIAL
Acid-Base Excess: 1.5 mmol/L (ref 0.0–2.0)
FIO2: 21 %
O2 Saturation: 96.5 %
TCO2: 23.3 mmol/L (ref 0–100)
pCO2 arterial: 37.2 mmHg (ref 35.0–45.0)

## 2011-06-18 LAB — IRON AND TIBC
Saturation Ratios: 5 % — ABNORMAL LOW (ref 20–55)
UIBC: 363 ug/dL (ref 125–400)

## 2011-06-18 LAB — PROTIME-INR
INR: 1.36 (ref 0.00–1.49)
Prothrombin Time: 17 seconds — ABNORMAL HIGH (ref 11.6–15.2)

## 2011-06-18 LAB — GLUCOSE, CAPILLARY: Glucose-Capillary: 105 mg/dL — ABNORMAL HIGH (ref 70–99)

## 2011-06-18 MED ORDER — WARFARIN SODIUM 10 MG PO TABS
10.0000 mg | ORAL_TABLET | Freq: Once | ORAL | Status: AC
  Administered 2011-06-18: 10 mg via ORAL
  Filled 2011-06-18: qty 1
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
## 2011-06-18 NOTE — Progress Notes (Addendum)
SUBJECTIVE:I feel so much better!  Active Problems:  DIABETES MELLITUS, TYPE II  OBESITY  DYSPNEA  Renal failure, acute  Atrial fibrillation  Right heart failure  HTN (hypertension)   LABS: Basic Metabolic Panel:  Basename 06/18/11 0505 06/17/11 0534  NA 141 140  K 3.3* 3.9  CL 102 104  CO2 27 24  GLUCOSE 135* 140*  BUN 61* 68*  CREATININE 2.15* 2.34*  CALCIUM 9.7 9.7  MG -- --  PHOS -- 5.2*   CBC:  Basename 06/18/11 0505 06/17/11 0534  WBC 5.7 6.0  NEUTROABS -- --  HGB 9.7* 9.7*  HCT 30.5* 31.0*  MCV 91.0 91.2  PLT 141* 153  INR-1.36 BNP increased from 1455 to 1894  RADIOLOGY: US Carotid Duplex Bilateral 06/17/2011 IMPRESSION: Minimal intimal wall thickening and atherosclerotic plaque within the bilateral carotid bulbs (left greater than right) and proximal aspect of the bilateral internal carotid arteries, not resulting in hemodynamically significant stenosis.  Original Report Authenticated By: Waynard Reeds, M.D.     PHYSICAL EXAM BP 121/71  Pulse 81  Temp(Src) 98 F (36.7 C) (Oral)  Resp 18  Ht 5\' 7"  (1.702 m)  Wt 282 lb 10.1 oz (128.2 kg)  BMI 44.27 kg/m2  SpO2 91% General: Well developed, well nourished, in no acute distress Head: Eyes PERRLA, No xanthomas.   Normal cephalic and atramatic  Lungs: Clear bilaterally to auscultation and percussion. Heart: Irregular No MRG .  Pulses are 2+ & equal.            No carotid bruit. No JVD.  No abdominal bruits. No femoral bruits. Abdomen: Bowel sounds are positive, abdomen soft and non-tender without masses or                  Hernia's noted. Msk:  Back normal, normal gait. Normal strength and tone for age. Extremities: No clubbing, cyanosis or edema.  DP +1 Neuro: Alert and oriented X 3. Psych:  Good affect, responds appropriately  TELEMETRY: Reviewed telemetry pt in: Atrial fib: rates in the 80's.  ASSESSMENT AND PLAN: 1. Acute on chronic diastolic congestive heart failure: She is now well  compensated, breathing better, and edema is a limited. She is now placed on oral Lasix. Total weight loss this admission, from maximum weight recorded, 11 pounds. She will continue current Demadex regimen of 50 mg by mouth daily. She has followup appointment scheduled in our office on 07/02/2011 at 11 AM. She is to continue daily weights low-salt diet. If she gains 2-3 pounds in 24 hours or 5 pounds 1 week she is to call us. It may be necessary for her to take metolazone on a when necessary basis however this will not be provided for her at her followup appointment.  2. Hypertension: Blood pressure is well-controlled currently, with 3 antihypertensives to include clonidine 0.2 mg by mouth twice a day verapamil 360 mg by mouth every day. and labetalol 400 mg twice a day. She is to monitor blood pressures at home and as stated above watch salt intake.  3. Atrial fibrillation: Rate is currently well controlled on current medications. She is receiving Coumadin with PT/INR checks from the free clinic. She is to continue to follow in the free clinic for continued management and dosing. INR is 1.36 which is subtherapeutic.  She will need quick followup in the free clinic for PT/INR check to ascertain whether this needs to be continued. She should receive warfarin today, and be seen in the free clinic  on Monday.  Bettey Mare. Lyman Bishop NP [facility] 06/18/2011, 9:41 AM   Cardiology Attending Patient interviewed and examined. Discussed with Joni Reining, NP.  Above note annotated and modified based upon my findings.  Patient has diuresed another liter, but weight has decreased by 34 pounds overnight.  ProBNP level has increased modestly over the past few days, but is not extremely high. ABG shows adequate respiratory function.  Weight is still substantially above her previous baseline of 265-70 pounds, blood substantially reduced from highest values measured in hospital.  Chest x-ray is clear, and  symptoms are controlled. Discharge is reasonable with close clinical followup.  Patient has not responded to moderate doses of warfarin. She will need daily INRs. If this cannot be accomplished at [facility], please refer her to the [place] office.  She does not need nor can she afford home lovenox.   Isleton Bing, MD 06/18/2011, 5:43 PM

## 2011-06-18 NOTE — Progress Notes (Signed)
Subjective: Interval History: has no complaint of difficulty in breathing. Patient also states that her swelling has improved significantly..  Objective: Vital signs in last 24 hours: Temp:  [97.4 F (36.3 C)-99.4 F (37.4 C)] 98.6 F (37 C) (01/04 1038) Pulse Rate:  [61-85] 85  (01/04 1038) Resp:  [18-20] 20  (01/04 1038) BP: (100-154)/(62-79) 107/70 mmHg (01/04 1038) SpO2:  [91 %-98 %] 97 % (01/04 1038) Weight:  [128.2 kg (282 lb 10.1 oz)] 282 lb 10.1 oz (128.2 kg) (01/04 0500) Weight change: -15.273 kg (-33 lb 10.7 oz)  Intake/Output from previous day: 01/03 0701 - 01/04 0700 In: 660 [P.O.:660] Out: 1750 [Urine:1750] Intake/Output this shift: Total I/O In: 460 [P.O.:460] Out: -   General appearance: alert, cooperative and no distress Resp: diminished breath sounds bilaterally Cardio: regular rate and rhythm, S1, S2 normal, no murmur, click, rub or gallop GI: Abdomen obese, nontender and positive bowel sound Extremities: edema Trace to 1+ edema  Lab Results:  Basename 06/18/11 0505 06/17/11 0534  WBC 5.7 6.0  HGB 9.7* 9.7*  HCT 30.5* 31.0*  PLT 141* 153   BMET:  Basename 06/18/11 0505 06/17/11 0534  NA 141 140  K 3.3* 3.9  CL 102 104  CO2 27 24  GLUCOSE 135* 140*  BUN 61* 68*  CREATININE 2.15* 2.34*  CALCIUM 9.7 9.7   No results found for this basename: PTH:2 in the last 72 hours Iron Studies:  Basename 06/17/11 0534  IRON 25*  TIBC 365  TRANSFERRIN --  FERRITIN 63    Studies/Results: US Carotid Duplex Bilateral  06/17/2011  *RADIOLOGY REPORT*  Clinical Data: Carotid bruit; history of syncopal episode, diabetes and hypertension, history of arrhythmia  BILATERAL CAROTID DUPLEX ULTRASOUND  Technique: Gray scale imaging, color Doppler and duplex ultrasound was performed of bilateral carotid and vertebral arteries in the neck.  Comparison:  None.  Criteria:  Quantification of carotid stenosis is based on velocity parameters that correlate the residual  internal carotid diameter with NASCET-based stenosis levels, using the diameter of the distal internal carotid lumen as the denominator for stenosis measurement.  The following velocity measurements were obtained:                   PEAK SYSTOLIC/END DIASTOLIC RIGHT ICA:                        97/15cm/sec CCA:                        85/16cm/sec SYSTOLIC ICA/CCA RATIO:     1.14 DIASTOLIC ICA/CCA RATIO:    0.90 ECA:                        01/12cm/sec  LEFT ICA:                        94/14cm/sec CCA:                        99/15cm/sec SYSTOLIC ICA/CCA RATIO:     0.95 DIASTOLIC ICA/CCA RATIO:    0.90 ECA:                        96cm/sec  Findings:  RIGHT CAROTID ARTERY: There is minimal intimal wall thickening within the carotid bulb, extending to the proximal aspect of the internal carotid artery, not resulting in hemodynamically  significant stenosis.  RIGHT VERTEBRAL ARTERY:  Preserved antegrade flow.  LEFT CAROTID ARTERY: There is a minimal amount of eccentric atherosclerotic plaque within the carotid bulb, not resulting in hemodynamically significant stenosis. Minimal intimal wall thickening within the proximal aspect of the internal carotid artery.  LEFT VERTEBRAL ARTERY:  Preserved antegrade flow.  IMPRESSION: Minimal intimal wall thickening and atherosclerotic plaque within the bilateral carotid bulbs (left greater than right) and proximal aspect of the bilateral internal carotid arteries, not resulting in hemodynamically significant stenosis.  Original Report Authenticated By: Waynard Reeds, M.D.    I have reviewed the patient's current medications.  Assessment/Plan: Problem #1 renal failure presently her BUN and creatinine seems to be progressively improving. PND 61 creatinine is 2.15. Problem #2 hypokalemia this is secondary to diuretics Problem #3 anasarca she is on Demadex and metolazone patient was good urine output and her her edema seems to have improved. Problem #4 anemia as this moment  ferritin seems to be very low iron saturation is not calculated because of low iron. Hence patient seems to have iron deficiency anemia.  problem #5 history of a trial fibrillation Problem #6 obesity Problem #7 history of diabetes Problem #8 history of possible chronic liver disease Problem #9 history of hypertension blood pressure seems to be controlled very well. Plan: Continue his Demadex at present house          We'll check metolazone 5 mg once a day on when necessary basis. And I've explained to her how  to take it  Nu-Iron 150 mg once a day. If patient is going to be discharged will see her 3-4 weeks as outpatient.    LOS: 4 days   Rheana Casebolt S 06/18/2011,1:15 PM

## 2011-06-18 NOTE — Progress Notes (Signed)
Subjective: Patient feeling much better. No complaints of shortness of breath. Does have swelling has come down significantly.  Objective: Vital signs in last 24 hours: Temp:  [98 F (36.7 C)-99.4 F (37.4 C)] 98.1 F (36.7 C) (01/04 1500) Pulse Rate:  [61-85] 66  (01/04 1500) Resp:  [18-20] 18  (01/04 1500) BP: (100-136)/(62-76) 111/62 mmHg (01/04 1500) SpO2:  [91 %-98 %] 95 % (01/04 1500) Weight:  [128.2 kg (282 lb 10.1 oz)] 282 lb 10.1 oz (128.2 kg) (01/04 0500) Weight change: -15.273 kg (-33 lb 10.7 oz) Last BM Date: 06/16/11  Intake/Output from previous day: 01/03 0701 - 01/04 0700 In: 660 [P.O.:660] Out: 1750 [Urine:1750] Total I/O In: 660 [P.O.:660] Out: -   Physical Exam: General: Alert, awake, oriented x3, in no acute distress. HEENT: No bruits, no goiter. Heart: Irregular, rate controlled Lungs: Clear to auscultation bilaterally. Abdomen: Soft, nontender, distended, positive bowel sounds, questionable fluid thrill Extremities: Trace + pitting edema bilaterally Neuro: Grossly intact, nonfocal.   Lab Results: Basic Metabolic Panel:  Basename 06/18/11 0505 06/17/11 0534  NA 141 140  K 3.3* 3.9  CL 102 104  CO2 27 24  GLUCOSE 135* 140*  BUN 61* 68*  CREATININE 2.15* 2.34*  CALCIUM 9.7 9.7  MG -- --  PHOS -- 5.2*   CBC:  Basename 06/18/11 0505 06/17/11 0534  WBC 5.7 6.0  NEUTROABS -- --  HGB 9.7* 9.7*  HCT 30.5* 31.0*  MCV 91.0 91.2  PLT 141* 153   BNP:  Basename 06/17/11 0534  PROBNP 1894.0*   CBG:  Basename 06/18/11 1131 06/18/11 0748 06/17/11 2217 06/17/11 1602 06/17/11 1127 06/17/11 0749  GLUCAP 159* 105* 124* 129* 124* 119*   Coagulation:  Basename 06/18/11 0505 06/17/11 0534  LABPROT 17.0* 17.1*  INR 1.36 1.37   Medications: Scheduled Meds:    . cloNIDine  0.2 mg Oral BID  . enoxaparin (LOVENOX) injection  200 mg Subcutaneous Q24H  . glipiZIDE  5 mg Oral Q0700  . insulin aspart  0-5 Units Subcutaneous QHS  . insulin aspart   0-9 Units Subcutaneous TID WC  . labetalol  450 mg Oral BID  . metolazone  2.5 mg Oral BID  . rosuvastatin  10 mg Oral q1800  . torsemide  50 mg Oral Daily  . verapamil  360 mg Oral Q0700  . warfarin  10 mg Oral ONCE-1800  . warfarin  10 mg Oral ONCE-1800  . warfarin   Does not apply Once   Continuous Infusions:  PRN Meds:.acetaminophen, acetaminophen, bisacodyl, HYDROmorphone, ondansetron (ZOFRAN) IV, ondansetron, polyethylene glycol, traZODone  Assessment/Plan: 1. Acute Diastolic CHF/Right heart failure with  Volume overload-patient's echocardiogram noted normal left ventricular function with dilation of the right ventricle and bilateral atria. Patient will followup with cardiology as outpatient. Some of her ascites secondary to an undiagnosed possible liver disease may be contributory. Outpatient GI followup.  3. Acute renal failure-suspect cardio-renal, with volume overload,worsened by ARB,  Continues to improve. Recheck labs in the morning, follow up with nephrology as outpatient.  4.  DIABETES MELLITUS, TYPE II: stable continue glipizide and SSI CBG stable.  5. OBESITY  6.  Atrial fibrillation rate controlled continue labetalol and coumadin. INR still subtherapeutic. On Lovenox as well. Given financial issues, we'll see if we can obtain some Lovenox for them to take home. In the meantime we'll have a better idea of how much Coumadin to send her home on.  Chronic liver disease: Workup in progress , hepatitis panel was negative. We'll  follow up with outpatient GI appointment.  Possible discharge home, likely tomorrow hopefully.    LOS: 4 days   Hollice Espy [facility] Pager: [phone] 06/15/2011, 4:45 PM

## 2011-06-19 LAB — PROTIME-INR
INR: 1.49 (ref 0.00–1.49)
Prothrombin Time: 18.3 seconds — ABNORMAL HIGH (ref 11.6–15.2)

## 2011-06-19 LAB — BASIC METABOLIC PANEL
Calcium: 9.6 mg/dL (ref 8.4–10.5)
Chloride: 98 mEq/L (ref 96–112)
Creatinine, Ser: 2.32 mg/dL — ABNORMAL HIGH (ref 0.50–1.10)
GFR calc Af Amer: 24 mL/min — ABNORMAL LOW (ref >90)
Sodium: 137 mEq/L (ref 135–145)

## 2011-06-19 LAB — GLUCOSE, CAPILLARY
Glucose-Capillary: 123 mg/dL — ABNORMAL HIGH (ref 70–99)
Glucose-Capillary: 132 mg/dL — ABNORMAL HIGH (ref 70–99)
Glucose-Capillary: 182 mg/dL — ABNORMAL HIGH (ref 70–99)
Glucose-Capillary: 111 mg/dL — ABNORMAL HIGH (ref 70–99)
Glucose-Capillary: 171 mg/dL — ABNORMAL HIGH (ref 70–99)

## 2011-06-19 MED ORDER — WARFARIN SODIUM 10 MG PO TABS
10.0000 mg | ORAL_TABLET | Freq: Once | ORAL | Status: AC
  Administered 2011-06-19: 10 mg via ORAL
  Filled 2011-06-19: qty 1

## 2011-06-19 NOTE — Progress Notes (Addendum)
CBG machine lost 2200 CBG, no insulin given. Rechecked CBG at 0603, result was 123. Dr. Onalee Hua notified, no new orders. Sheryn Bison
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
## 2011-06-19 NOTE — Progress Notes (Signed)
Subjective: Patient continues to feel okay. She felt she may have asked her to herself too much was a little more tired, but otherwise no complaints. No shortness of breath.  Objective: Vital signs in last 24 hours: Temp:  [98.1 F (36.7 C)-98.7 F (37.1 C)] 98.5 F (36.9 C) (01/05 0219) Pulse Rate:  [59-68] 59  (01/05 1136) Resp:  [18-20] 20  (01/05 0219) BP: (99-140)/(52-64) 140/61 mmHg (01/05 0702) SpO2:  [94 %-98 %] 98 % (01/05 1136) Weight change:  Last BM Date: 06/18/11  Intake/Output from previous day: 01/04 0701 - 01/05 0700 In: 840 [P.O.:840] Out: -  Total I/O In: 240 [P.O.:240] Out: -   Physical Exam: General: Alert, awake, oriented x3, in no acute distress. HEENT: No bruits, no goiter. Heart: Irregular, rate controlled Lungs: Clear to auscultation bilaterally. Abdomen: Soft, nontender, distended, positive bowel sounds, questionable fluid thrill Extremities: Trace + pitting edema bilaterally Neuro: Grossly intact, nonfocal. (No change)  Lab Results: Basic Metabolic Panel:  Basename 06/19/11 0652 06/18/11 0505 06/17/11 0534  NA 137 141 --  K 3.4* 3.3* --  CL 98 102 --  CO2 30 27 --  GLUCOSE 137* 135* --  BUN 59* 61* --  CREATININE 2.32* 2.15* --  CALCIUM 9.6 9.7 --  MG -- -- --  PHOS -- -- 5.2*   CBC:  Basename 06/18/11 0505 06/17/11 0534  WBC 5.7 6.0  NEUTROABS -- --  HGB 9.7* 9.7*  HCT 30.5* 31.0*  MCV 91.0 91.2  PLT 141* 153   BNP:  Basename 06/17/11 0534  PROBNP 1894.0*   CBG:  Basename 06/19/11 1116 06/19/11 0745 06/19/11 0603 06/18/11 1634 06/18/11 1131 06/18/11 0748  GLUCAP 182* 132* 123* 152* 159* 105*   Coagulation:  Basename 06/19/11 0652 06/18/11 0505  LABPROT 18.3* 17.0*  INR 1.49 1.36   Medications: Scheduled Meds:    . cloNIDine  0.2 mg Oral BID  . enoxaparin (LOVENOX) injection  200 mg Subcutaneous Q24H  . glipiZIDE  5 mg Oral Q0700  . insulin aspart  0-5 Units Subcutaneous QHS  . insulin aspart  0-9 Units  Subcutaneous TID WC  . labetalol  450 mg Oral BID  . metolazone  2.5 mg Oral BID  . rosuvastatin  10 mg Oral q1800  . torsemide  50 mg Oral Daily  . verapamil  360 mg Oral Q0700  . warfarin  10 mg Oral ONCE-1800  . warfarin  10 mg Oral ONCE-1800  . warfarin   Does not apply Once   Continuous Infusions:  PRN Meds:.acetaminophen, acetaminophen, bisacodyl, HYDROmorphone, ondansetron (ZOFRAN) IV, ondansetron, polyethylene glycol, traZODone  Assessment/Plan: 1. Acute Diastolic CHF/Right heart failure with  Volume overload-patient's echocardiogram noted normal left ventricular function with dilation of the right ventricle and bilateral atria. Patient will followup with cardiology as outpatient. Some of her ascites secondary to an undiagnosed possible liver disease may be contributory. Outpatient GI followup.  3. Acute renal failure-suspect cardio-renal, with volume overload,worsened by ARB,  Mild increase this morning. Recheck labs tomorrow. May need to ease up on Lasix if creatinine increases tomorrow.  4.  DIABETES MELLITUS, TYPE II: stable continue glipizide and SSI CBG stable.  5. OBESITY  6.  Atrial fibrillation rate controlled continue labetalol and coumadin. INR still subtherapeutic. On Lovenox as well. INR up today. Likely 10 mg, she hopefully will be therapeutic tomorrow and then can sent home on Coumadin only.  Chronic liver disease: Workup in progress , hepatitis panel was negative. We'll follow up with outpatient GI  appointment.  Possible discharge home, likely tomorrow hopefully.    LOS: 5 days   Hollice Espy [facility] Pager: [phone] 06/15/2011, 2:25 PM

## 2011-06-20 LAB — CBC
Hemoglobin: 9.6 g/dL — ABNORMAL LOW (ref 12.0–15.0)
MCH: 28.9 pg (ref 26.0–34.0)
MCHC: 32.2 g/dL (ref 30.0–36.0)
MCV: 89.8 fL (ref 78.0–100.0)
RBC: 3.32 MIL/uL — ABNORMAL LOW (ref 3.87–5.11)

## 2011-06-20 LAB — GLUCOSE, CAPILLARY
Glucose-Capillary: 147 mg/dL — ABNORMAL HIGH (ref 70–99)
Glucose-Capillary: 115 mg/dL — ABNORMAL HIGH (ref 70–99)
Glucose-Capillary: 154 mg/dL — ABNORMAL HIGH (ref 70–99)
Glucose-Capillary: 198 mg/dL — ABNORMAL HIGH (ref 70–99)
Glucose-Capillary: 204 mg/dL — ABNORMAL HIGH (ref 70–99)

## 2011-06-20 LAB — BASIC METABOLIC PANEL
CO2: 32 mEq/L (ref 19–32)
Glucose, Bld: 116 mg/dL — ABNORMAL HIGH (ref 70–99)
Potassium: 2.9 mEq/L — ABNORMAL LOW (ref 3.5–5.1)
Sodium: 139 mEq/L (ref 135–145)

## 2011-06-20 MED ORDER — POTASSIUM CHLORIDE CRYS ER 20 MEQ PO TBCR
40.0000 meq | EXTENDED_RELEASE_TABLET | Freq: Once | ORAL | Status: AC
  Administered 2011-06-20: 40 meq via ORAL
  Filled 2011-06-20: qty 1

## 2011-06-20 MED ORDER — WARFARIN SODIUM 10 MG PO TABS
10.0000 mg | ORAL_TABLET | Freq: Once | ORAL | Status: AC
  Administered 2011-06-20: 10 mg via ORAL
  Filled 2011-06-20: qty 1

## 2011-06-20 MED ORDER — GUAIFENESIN ER 600 MG PO TB12
600.0000 mg | ORAL_TABLET | Freq: Two times a day (BID) | ORAL | Status: DC | PRN
  Administered 2011-06-20 – 2011-06-23 (×4): 600 mg via ORAL
  Filled 2011-06-20 (×4): qty 1

## 2011-06-20 MED ORDER — POTASSIUM CHLORIDE CRYS ER 20 MEQ PO TBCR
40.0000 meq | EXTENDED_RELEASE_TABLET | Freq: Two times a day (BID) | ORAL | Status: DC
  Administered 2011-06-20 – 2011-06-23 (×7): 40 meq via ORAL
  Filled 2011-06-20 (×9): qty 2

## 2011-06-20 MED ORDER — POTASSIUM CHLORIDE 10 MEQ/100ML IV SOLN
10.0000 meq | INTRAVENOUS | Status: DC
  Administered 2011-06-20: 10 meq via INTRAVENOUS
  Filled 2011-06-20: qty 300

## 2011-06-20 MED ORDER — SODIUM CHLORIDE 0.9 % IV BOLUS (SEPSIS)
500.0000 mL | Freq: Once | INTRAVENOUS | Status: AC
  Administered 2011-06-20: 500 mL via INTRAVENOUS

## 2011-06-20 MED ORDER — CLONIDINE HCL 0.1 MG PO TABS
0.1000 mg | ORAL_TABLET | Freq: Two times a day (BID) | ORAL | Status: DC
  Administered 2011-06-21 – 2011-06-25 (×8): 0.1 mg via ORAL
  Filled 2011-06-20 (×9): qty 1

## 2011-06-20 MED ORDER — LABETALOL HCL 200 MG PO TABS
200.0000 mg | ORAL_TABLET | Freq: Two times a day (BID) | ORAL | Status: DC
  Administered 2011-06-21 – 2011-06-22 (×4): 200 mg via ORAL
  Filled 2011-06-20 (×5): qty 1

## 2011-06-20 NOTE — Progress Notes (Signed)
Subjective: Patient doing okay today. She does complain of some mild fatigue, but otherwise doing okay.  Objective: Vital signs in last 24 hours: Temp:  [97.9 F (36.6 C)-98.7 F (37.1 C)] 97.9 F (36.6 C) (01/06 1348) Pulse Rate:  [52-82] 82  (01/06 1840) Resp:  [18] 18  (01/06 0529) BP: (76-132)/(45-69) 93/50 mmHg (01/06 1840) SpO2:  [93 %-98 %] 97 % (01/06 1348) Weight:  [126.962 kg (279 lb 14.4 oz)] 279 lb 14.4 oz (126.962 kg) (01/06 0500) Weight change:  Last BM Date: 06/19/11  Intake/Output from previous day: 01/05 0701 - 01/06 0700 In: 480 [P.O.:480] Out: 200 [Urine:200]    Physical Exam: General: Alert, awake, oriented x3, in no acute distress. HEENT: No bruits, no goiter. Heart: Irregular, rate controlled Lungs: Clear to auscultation bilaterally. Abdomen: Soft, nontender, distended, positive bowel sounds, questionable fluid thrill Extremities: Trace + pitting edema bilaterally Neuro: Grossly intact, nonfocal. (No change)  Lab Results: Basic Metabolic Panel:  Basename 06/20/11 0646 06/19/11 0652  NA 139 137  K 2.9* 3.4*  CL 96 98  CO2 32 30  GLUCOSE 116* 137*  BUN 65* 59*  CREATININE 2.55* 2.32*  CALCIUM 9.6 9.6  MG -- --  PHOS -- --   CBC:  Basename 06/20/11 0646 06/18/11 0505  WBC 6.2 5.7  NEUTROABS -- --  HGB 9.6* 9.7*  HCT 29.8* 30.5*  MCV 89.8 91.0  PLT 124* 141*   CBG:  Basename 06/20/11 1706 06/20/11 1125 06/20/11 0744 06/19/11 2113 06/19/11 1711 06/19/11 1116  GLUCAP 198* 154* 115* 171* 111* 182*   Coagulation:  Basename 06/20/11 0646 06/19/11 0652  LABPROT 21.2* 18.3*  INR 1.80* 1.49   Medications: Scheduled Meds:    . cloNIDine  0.1 mg Oral BID  . enoxaparin (LOVENOX) injection  200 mg Subcutaneous Q24H  . glipiZIDE  5 mg Oral Q0700  . insulin aspart  0-5 Units Subcutaneous QHS  . insulin aspart  0-9 Units Subcutaneous TID WC  . labetalol  200 mg Oral BID  . potassium chloride  40 mEq Oral BID  . potassium chloride  40  mEq Oral Once  . rosuvastatin  10 mg Oral q1800  . sodium chloride  500 mL Intravenous Once  . torsemide  50 mg Oral Daily  . verapamil  360 mg Oral Q0700  . warfarin  10 mg Oral ONCE-1800  . warfarin   Does not apply Once  . DISCONTD: cloNIDine  0.2 mg Oral BID  . DISCONTD: labetalol  450 mg Oral BID  . DISCONTD: metolazone  2.5 mg Oral BID  . DISCONTD: potassium chloride  10 mEq Intravenous Q1 Hr x 3   Continuous Infusions:  PRN Meds:.acetaminophen, acetaminophen, bisacodyl, guaiFENesin, HYDROmorphone, ondansetron (ZOFRAN) IV, ondansetron, polyethylene glycol, traZODone  Assessment/Plan: 1. Acute Diastolic CHF/Right heart failure with  Volume overload-patient's echocardiogram noted normal left ventricular function with dilation of the right ventricle and bilateral atria. Patient will followup with cardiology as outpatient. She's been aggressively diuresed and her creatinine has started to trend up over the last day.  We had to decrease some of her blood pressure medications when she started having softer blood pressures this afternoon. If her blood pressure still stays low, we'll look into some gentle IV hydration.  3. Acute renal failure-suspect cardio-renal, with volume overload,worsened by ARB,  Mild increase this morning. Nephrology has been following. I feel that even with the increased, she is at or near her baseline. Given her episodes of hypotension, we started decreasing some of  her blood pressure medications. We'll discuss with nephrology tomorrow if her creatinine is up about the possibility of gentle hydration.  4.  DIABETES MELLITUS, TYPE II: stable continue glipizide and SSI CBG stable. CBGs have stayed under 200. With her renal function starting to increase, I'm hesitant to increase her glipizide.  5. OBESITY  6.  Atrial fibrillation rate controlled continue labetalol and coumadin. INR still subtherapeutic. On Lovenox as well. INR up today. She should be therapeutic tomorrow  and then begin continue her on Coumadin only. [place] [facility]ology is 1 to follow her Coumadin levels as an outpatient.  Chronic liver disease: Noted ascites, hepatitis panel was negative. Recommend outpatient GI appointment. This could be nonalcoholic fatty liver disease  Possible discharge home, likely tomorrow hopefully.    LOS: 6 days   Hollice Espy [facility] Pager: [phone] 06/15/2011, 8:03 PM

## 2011-06-20 NOTE — Progress Notes (Signed)
Notified Dr Rito Ehrlich of low BP, orders given.  Will continue to recheck BP.
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
## 2011-06-20 NOTE — Progress Notes (Signed)
Subjective: Interval History: has no complaint of shortness of breath or orthopnea. Overall she is feeling better the legs welling also improving..  Objective: Vital signs in last 24 hours: Temp:  [97.8 F (36.6 C)-98.7 F (37.1 C)] 98.1 F (36.7 C) (01/06 0529) Pulse Rate:  [59-77] 60  (01/06 0923) Resp:  [18-20] 18  (01/06 0529) BP: (93-132)/(57-86) 93/57 mmHg (01/06 0923) SpO2:  [93 %-98 %] 93 % (01/06 0532) Weight:  [126.962 kg (279 lb 14.4 oz)] 279 lb 14.4 oz (126.962 kg) (01/06 0500) Weight change:   Intake/Output from previous day: 01/05 0701 - 01/06 0700 In: 480 [P.O.:480] Out: 200 [Urine:200] Intake/Output this shift:    General appearance: alert, cooperative and no distress Resp: diminished breath sounds bilaterally Cardio: regular rate and rhythm, S1, S2 normal, no murmur, click, rub or gallop GI: Abdomen is obese, nontender. And there is no rebound tenderness. Extremities: edema She has 1-2+ edema.  Lab Results:  [facility] 06/20/11 0646 06/18/11 0505  WBC 6.2 5.7  HGB 9.6* 9.7*  HCT 29.8* 30.5*  PLT 124* 141*   BMET:  Basename 06/20/11 0646 06/19/11 0652  NA 139 137  K 2.9* 3.4*  CL 96 98  CO2 32 30  GLUCOSE 116* 137*  BUN 65* 59*  CREATININE 2.55* 2.32*  CALCIUM 9.6 9.6   No results found for this basename: PTH:2 in the last 72 hours Iron Studies:  Basename 06/18/11 0505  IRON 18*  TIBC 381  TRANSFERRIN --  FERRITIN 69    Studies/Results: No results found.  I have reviewed the patient's current medications.  Assessment/Plan: Problem #1 her renal failure at this moment seems to be chronic her BUN is 65 creatinine is 2.55 renal function is stable. She is none oliguric. Problem #2 hypokalemia this may be associated to diuretics her potassium is 2.9 has declined. 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 Problem #8 history of chronic liver disease Plan: We'll give her KCl 10 mEq IV over one hour x3 doses           We'll put her on KCL 40 mg by mouth twice a day           We'll continue only on Demadex.            We'll check her basic metabolic panel in the morning.    LOS: 6 days   Laurrie Toppin S 06/20/2011,9:24 AM

## 2011-06-21 ENCOUNTER — Encounter (HOSPITAL_COMMUNITY): Payer: Self-pay | Admitting: Internal Medicine

## 2011-06-21 DIAGNOSIS — D509 Iron deficiency anemia, unspecified: Secondary | ICD-10-CM

## 2011-06-21 DIAGNOSIS — I5033 Acute on chronic diastolic (congestive) heart failure: Secondary | ICD-10-CM | POA: Diagnosis present

## 2011-06-21 DIAGNOSIS — E871 Hypo-osmolality and hyponatremia: Secondary | ICD-10-CM | POA: Diagnosis not present

## 2011-06-21 DIAGNOSIS — D696 Thrombocytopenia, unspecified: Secondary | ICD-10-CM | POA: Diagnosis not present

## 2011-06-21 HISTORY — DX: Iron deficiency anemia, unspecified: D50.9

## 2011-06-21 LAB — PROTEIN ELECTROPHORESIS, SERUM
Beta 2: 4.6 % (ref 3.2–6.5)
Beta Globulin: 7 % (ref 4.7–7.2)
M-Spike, %: NOT DETECTED g/dL
Total Protein ELP: 6.1 g/dL (ref 6.0–8.3)

## 2011-06-21 LAB — BASIC METABOLIC PANEL
BUN: 73 mg/dL — ABNORMAL HIGH (ref 6–23)
Calcium: 9.8 mg/dL (ref 8.4–10.5)
Creatinine, Ser: 3.26 mg/dL — ABNORMAL HIGH (ref 0.50–1.10)
GFR calc Af Amer: 16 mL/min — ABNORMAL LOW (ref >90)
GFR calc non Af Amer: 14 mL/min — ABNORMAL LOW (ref >90)

## 2011-06-21 LAB — CBC
HCT: 32.1 % — ABNORMAL LOW (ref 36.0–46.0)
MCH: 28.1 pg (ref 26.0–34.0)
MCHC: 31.5 g/dL (ref 30.0–36.0)
MCV: 89.4 fL (ref 78.0–100.0)
Platelets: 146 10*3/uL — ABNORMAL LOW (ref 150–400)
RDW: 14.9 % (ref 11.5–15.5)
WBC: 8 10*3/uL (ref 4.0–10.5)

## 2011-06-21 LAB — GLUCOSE, CAPILLARY: Glucose-Capillary: 198 mg/dL — ABNORMAL HIGH (ref 70–99)

## 2011-06-21 MED ORDER — SODIUM CHLORIDE 0.9 % IJ SOLN
3.0000 mL | Freq: Two times a day (BID) | INTRAMUSCULAR | Status: DC
  Administered 2011-06-21 – 2011-06-24 (×6): 3 mL via INTRAVENOUS
  Filled 2011-06-21 (×3): qty 3

## 2011-06-21 MED ORDER — SODIUM CHLORIDE 0.9 % IV SOLN: 250.0000 mL | INTRAVENOUS | Status: DC | PRN

## 2011-06-21 MED ORDER — OXYMETAZOLINE HCL 0.05 % NA SOLN: 1.0000 | Freq: Two times a day (BID) | NASAL | Status: DC | PRN

## 2011-06-21 MED ORDER — SALINE SPRAY 0.65 % NA SOLN
1.0000 | NASAL | Status: DC | PRN
  Filled 2011-06-21: qty 44

## 2011-06-21 MED ORDER — WARFARIN SODIUM 7.5 MG PO TABS
7.5000 mg | ORAL_TABLET | Freq: Once | ORAL | Status: AC
  Administered 2011-06-21: 7.5 mg via ORAL
  Filled 2011-06-21: qty 1

## 2011-06-21 MED ORDER — SODIUM CHLORIDE 0.9 % IJ SOLN
3.0000 mL | INTRAMUSCULAR | Status: DC | PRN
  Administered 2011-06-23: 3 mL via INTRAVENOUS
  Filled 2011-06-21 (×3): qty 3

## 2011-06-21 MED ORDER — ENOXAPARIN SODIUM 120 MG/0.8ML ~~LOC~~ SOLN
120.0000 mg | SUBCUTANEOUS | Status: DC
  Filled 2011-06-21 (×2): qty 0.8

## 2011-06-21 MED ORDER — BENZONATATE 100 MG PO CAPS
100.0000 mg | ORAL_CAPSULE | Freq: Three times a day (TID) | ORAL | Status: DC
  Administered 2011-06-21 – 2011-06-25 (×12): 100 mg via ORAL
  Filled 2011-06-21 (×13): qty 1

## 2011-06-21 MED ORDER — POLYSACCHARIDE IRON 150 MG PO CAPS
150.0000 mg | ORAL_CAPSULE | Freq: Every day | ORAL | Status: DC
  Administered 2011-06-21 – 2011-06-23 (×3): 150 mg via ORAL
  Filled 2011-06-21 (×4): qty 1

## 2011-06-21 NOTE — Progress Notes (Signed)
Subjective: The patient complains of a stuffy nose and a dry cough. Otherwise, she feels overall better with less chest congestion and less shortness of breath.  Objective: Vital signs in last 24 hours: Filed Vitals:   06/21/11 0505 06/21/11 0506 06/21/11 0510 06/21/11 0806  BP: 137/80 125/72 151/77   Pulse: 77 82 79   Temp: 98 F (36.7 C)     TempSrc: Oral     Resp: 12     Height:      Weight:    126.7 kg (279 lb 5.2 oz)  SpO2: 96% 91% 93%     Intake/Output Summary (Last 24 hours) at 06/21/11 1408 Last data filed at 06/20/11 1700  Gross per 24 hour  Intake    240 ml  Output      0 ml  Net    240 ml    Weight change:   Physical exam: Lungs: Decreased breath sounds at the bases, otherwise clear. Heart: Distant irregular, irregular. Abdomen: Obese, positive bowel sounds, nontender, nondistended. Extremities: Trace of pedal edema bilaterally.  Lab Results: Basic Metabolic Panel:  Basename 06/21/11 0533 06/20/11 0646  NA 131* 139  K 3.6 2.9*  CL 90* 96  CO2 29 32  GLUCOSE 99 116*  BUN 73* 65*  CREATININE 3.26* 2.55*  CALCIUM 9.8 9.6  MG -- --  PHOS -- --   Liver Function Tests: No results found for this basename: AST:2,ALT:2,ALKPHOS:2,BILITOT:2,PROT:2,ALBUMIN:2 in the last 72 hours No results found for this basename: LIPASE:2,AMYLASE:2 in the last 72 hours No results found for this basename: AMMONIA:2 in the last 72 hours CBC:  Basename 06/21/11 0533 06/20/11 0646  WBC 8.0 6.2  NEUTROABS -- --  HGB 10.1* 9.6*  HCT 32.1* 29.8*  MCV 89.4 89.8  PLT 146* 124*   Cardiac Enzymes: No results found for this basename: CKTOTAL:3,CKMB:3,CKMBINDEX:3,TROPONINI:3 in the last 72 hours BNP: No results found for this basename: PROBNP:3 in the last 72 hours D-Dimer: No results found for this basename: DDIMER:2 in the last 72 hours CBG:  Basename 06/21/11 1146 06/21/11 0814 06/20/11 2109 06/20/11 1706 06/20/11 1125 06/20/11 0744  GLUCAP 155* 120* 204* 198* 154* 115*     Hemoglobin A1C: No results found for this basename: HGBA1C in the last 72 hours Fasting Lipid Panel: No results found for this basename: CHOL,HDL,LDLCALC,TRIG,CHOLHDL,LDLDIRECT in the last 72 hours Thyroid Function Tests: No results found for this basename: TSH,T4TOTAL,FREET4,T3FREE,THYROIDAB in the last 72 hours Anemia Panel: No results found for this basename: VITAMINB12,FOLATE,FERRITIN,TIBC,IRON,RETICCTPCT in the last 72 hours Coagulation:  Basename 06/21/11 0533 06/20/11 0646  LABPROT 22.9* 21.2*  INR 1.99* 1.80*   Urine Drug Screen: Drugs of Abuse  No results found for this basename: labopia,  cocainscrnur,  labbenz,  amphetmu,  thcu,  labbarb    Alcohol Level: No results found for this basename: ETH:2 in the last 72 hours   Micro: No results found for this or any previous visit (from the past 240 hour(s)).  Studies/Results: No results found.  Medications: I have reviewed the patient's current medications.  Assessment: Active Problems:  DIABETES MELLITUS, TYPE II  OBESITY  DYSPNEA  Renal failure, acute  Atrial fibrillation  Right heart failure  HTN (hypertension)  Anemia, iron deficiency  Thrombocytopenia  Diastolic CHF, acute on chronic  Hyponatremia  1. Acute on chronic diastolic congestive heart failure/right heart failure. The patient has adequately diuresed. Her weight on admission was 131.5 kg and today it is 126.7 kg. Her 2-D echocardiogram from 06/15/2010 reveals preserved LV  function with an ejection fraction of 60%. Her TSH is within normal limits at 2.95.  Acute renal failure. Her creatinine is increasing. She may have underlying chronic kidney disease. Nephrologist, Dr. Kristian Covey is following. It appears that she has been over diuresed. ARB was discontinued on admission. Demadex was discontinued and gentle IV fluids were started per Dr.Befekadu. The abdominal ultrasound on 06/15/2011 revealed normal renal echogenicity and no  hydronephrosis.  Hyponatremia. Her serum sodium has decreased over the past 2 days. This may be secondary to overdiuresis and hypovolemic hyponatremia. As above.  Hypertension. Her blood pressures have been labile. Both clonidine and labetalol have been titrated downward. She is being maintained on verapamil at 360 mg daily.  Chronic atrial fibrillation. Her rate is controlled. Her INR is approaching therapeutic range on Coumadin.  Type 2 diabetes mellitus. Currently controlled. Her A1c is 6.7.  Iron deficiency anemia. Will add iron supplementation. We'll check a vitamin B 12 level.  Thrombocytopenia. Her platelet count has decreased since admission. This will be monitored while she is on full dose Lovenox.  Dry cough and nasal congestion.   Plan:  1. Start gentle IV fluids and decrease Demadex per Dr. Kristian Covey. We'll add Tessalon Perles and saline nasal spray and when necessary Afrin for symptomatic relief. Add Nu-Iron. We'll check a vitamin B 12 level.        LOS: 7 days   Ura Yingling 06/21/2011, 2:08 PM

## 2011-06-21 NOTE — Progress Notes (Signed)
Subjective: Interval History: has no complaint of difficulty in breathing She doesn't have any nausea or vomiting..  Objective: Vital signs in last 24 hours: Temp:  [97.7 F (36.5 C)-98.5 F (36.9 C)] 98 F (36.7 C) (01/07 0505) Pulse Rate:  [52-82] 79  (01/07 0510) Resp:  [12-18] 12  (01/07 0505) BP: (76-151)/(45-80) 151/77 mmHg (01/07 0510) SpO2:  [91 %-97 %] 93 % (01/07 0510) Weight:  [126.7 kg (279 lb 5.2 oz)] 279 lb 5.2 oz (126.7 kg) (01/07 0806) Weight change:   Intake/Output from previous day: 01/06 0701 - 01/07 0700 In: 1220 [P.O.:720; I.V.:500] Out: -  Intake/Output this shift:    General appearance: alert, cooperative and no distress Resp: clear to auscultation bilaterally Cardio: regular rate and rhythm, S1, S2 normal, no murmur, click, rub or gallop GI: soft, non-tender; bowel sounds normal; no masses,  no organomegaly Extremities: edema 1+ edema.  Lab Results:  Basename 06/21/11 0533 06/20/11 0646  WBC 8.0 6.2  HGB 10.1* 9.6*  HCT 32.1* 29.8*  PLT 146* 124*   BMET:  Basename 06/21/11 0533 06/20/11 0646  NA 131* 139  K 3.6 2.9*  CL 90* 96  CO2 29 32  GLUCOSE 99 116*  BUN 73* 65*  CREATININE 3.26* 2.55*  CALCIUM 9.8 9.6   No results found for this basename: PTH:2 in the last 72 hours Iron Studies: No results found for this basename: IRON,TIBC,TRANSFERRIN,FERRITIN in the last 72 hours  Studies/Results: No results found.  I have reviewed the patient's current medications.  Assessment/Plan: Problem #1 renal failure at this moment possibly chronic BUN 76 creatinine 3.26 seems to be worsening. This could be possibly from prerenal syndrome secondary to diuretics. Problem #2 hypokalemia she is on by mouth potassium potassium 3.6 corrected Problem #3 hyponatremia sodium is 131 declining this is hypervolomic hyponatremia possibly from a water intake. Problem #4 hypertension blood pressure seems to be controlled reasonably good Problem #5 history of  obesity Problem #6 history of diabetes Problem #7 history of cardiomyopathy with recurrent CHF patient has improved significantly but still she has some edema. Problem #8 history of a trial fibrillation her heart rate is controlled. Plan we'll DC Demadex We'll start her on normal saline at 50 cc per hour We'll restrict her free water to 500cc/day    LOS: 7 days   Krystal [name] S 06/21/2011,11:48 AM
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
## 2011-06-22 LAB — CBC
HCT: 29.8 % — ABNORMAL LOW (ref 36.0–46.0)
RBC: 3.35 MIL/uL — ABNORMAL LOW (ref 3.87–5.11)
RDW: 15.2 % (ref 11.5–15.5)
WBC: 7.8 10*3/uL (ref 4.0–10.5)

## 2011-06-22 LAB — PROTIME-INR
INR: 2.52 — ABNORMAL HIGH (ref 0.00–1.49)
Prothrombin Time: 27.6 seconds — ABNORMAL HIGH (ref 11.6–15.2)

## 2011-06-22 LAB — VITAMIN B12: Vitamin B-12: 556 pg/mL (ref 211–911)

## 2011-06-22 LAB — BASIC METABOLIC PANEL
CO2: 28 mEq/L (ref 19–32)
Chloride: 94 mEq/L — ABNORMAL LOW (ref 96–112)
Creatinine, Ser: 3.85 mg/dL — ABNORMAL HIGH (ref 0.50–1.10)
GFR calc Af Amer: 13 mL/min — ABNORMAL LOW (ref >90)
Potassium: 4.2 mEq/L (ref 3.5–5.1)
Sodium: 133 mEq/L — ABNORMAL LOW (ref 135–145)

## 2011-06-22 LAB — GLUCOSE, CAPILLARY
Glucose-Capillary: 87 mg/dL (ref 70–99)
Glucose-Capillary: 171 mg/dL — ABNORMAL HIGH (ref 70–99)

## 2011-06-22 MED ORDER — SODIUM CHLORIDE 0.9 % IV SOLN
INTRAVENOUS | Status: DC
  Administered 2011-06-22 – 2011-06-23 (×3): via INTRAVENOUS
  Administered 2011-06-24: 100 mL/h via INTRAVENOUS
  Administered 2011-06-24 – 2011-06-25 (×2): via INTRAVENOUS

## 2011-06-22 MED ORDER — SODIUM CHLORIDE 0.9 % IV SOLN: 250.0000 mL | INTRAVENOUS | Status: DC | PRN

## 2011-06-22 MED ORDER — WARFARIN SODIUM 6 MG PO TABS
6.0000 mg | ORAL_TABLET | Freq: Once | ORAL | Status: AC
  Administered 2011-06-22: 6 mg via ORAL
  Filled 2011-06-22: qty 1

## 2011-06-22 MED ORDER — SALINE SPRAY 0.65 % NA SOLN
NASAL | Status: AC
  Filled 2011-06-22: qty 44
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
## 2011-06-22 NOTE — Progress Notes (Signed)
Subjective: The patient says that she feels a little bit more swollen. However, she says that it could be just in her head.  Objective: Vital signs in last 24 hours: Filed Vitals:   06/21/11 2230 06/21/11 2252 06/22/11 0550 06/22/11 0649  BP: 120/73 120/76 110/72 100/60  Pulse:  77 67   Temp:  98.5 F (36.9 C) 97.7 F (36.5 C)   TempSrc:  Oral    Resp:  16 22   Height:      Weight:      SpO2:  94% 97%     Intake/Output Summary (Last 24 hours) at 06/22/11 1411 Last data filed at 06/22/11 0800  Gross per 24 hour  Intake    840 ml  Output      0 ml  Net    840 ml    Weight change:  Weight not recorded today so far.  Physical exam: Lungs: Decreased breath sounds at the bases, otherwise clear. Heart: Distant irregular, irregular. Abdomen: Obese, positive bowel sounds, nontender, nondistended. Extremities: Trace of pedal edema bilaterally.  Lab Results: Basic Metabolic Panel:  Basename 06/22/11 0527 06/21/11 0533  NA 133* 131*  K 4.2 3.6  CL 94* 90*  CO2 28 29  GLUCOSE 105* 99  BUN 78* 73*  CREATININE 3.85* 3.26*  CALCIUM 9.3 9.8  MG -- --  PHOS -- --   Liver Function Tests: No results found for this basename: AST:2,ALT:2,ALKPHOS:2,BILITOT:2,PROT:2,ALBUMIN:2 in the last 72 hours No results found for this basename: LIPASE:2,AMYLASE:2 in the last 72 hours No results found for this basename: AMMONIA:2 in the last 72 hours CBC:  Basename 06/22/11 0527 06/21/11 0533  WBC 7.8 8.0  NEUTROABS -- --  HGB 9.6* 10.1*  HCT 29.8* 32.1*  MCV 89.0 89.4  PLT 144* 146*   Cardiac Enzymes: No results found for this basename: CKTOTAL:3,CKMB:3,CKMBINDEX:3,TROPONINI:3 in the last 72 hours BNP: No results found for this basename: PROBNP:3 in the last 72 hours D-Dimer: No results found for this basename: DDIMER:2 in the last 72 hours CBG:  Basename 06/22/11 1134 06/22/11 0753 06/21/11 2116 06/21/11 1703 06/21/11 1146 06/21/11 0814  GLUCAP 171* 87 163* 198* 155* 120*    Hemoglobin A1C: No results found for this basename: HGBA1C in the last 72 hours Fasting Lipid Panel: No results found for this basename: CHOL,HDL,LDLCALC,TRIG,CHOLHDL,LDLDIRECT in the last 72 hours Thyroid Function Tests: No results found for this basename: TSH,T4TOTAL,FREET4,T3FREE,THYROIDAB in the last 72 hours Anemia Panel: No results found for this basename: VITAMINB12,FOLATE,FERRITIN,TIBC,IRON,RETICCTPCT in the last 72 hours Coagulation:  Basename 06/22/11 0527 06/21/11 0533  LABPROT 27.6* 22.9*  INR 2.52* 1.99*   Urine Drug Screen: Drugs of Abuse  No results found for this basename: labopia,  cocainscrnur,  labbenz,  amphetmu,  thcu,  labbarb    Alcohol Level: No results found for this basename: ETH:2 in the last 72 hours   Micro: No results found for this or any previous visit (from the past 240 hour(s)).  Studies/Results: No results found.  Medications: I have reviewed the patient's current medications.  Assessment: Active Problems:  DIABETES MELLITUS, TYPE II  OBESITY  DYSPNEA  Renal failure, acute  Atrial fibrillation  Right heart failure  HTN (hypertension)  Anemia, iron deficiency  Thrombocytopenia  Diastolic CHF, acute on chronic  Hyponatremia  1. Acute on chronic diastolic congestive heart failure/right heart failure. The patient has adequately diuresed. Her weight on admission was 131.5 kg and yesterday it was 126.7 kg. Her 2-D echocardiogram from 06/15/2010 reveals preserved LV  function with an ejection fraction of 60%. Her TSH is within normal limits at 2.95.  Acute renal failure. Her creatinine is increasing. She may have underlying chronic kidney disease. Nephrologist, Dr. Kristian Covey is following. It appears that she has been over diuresed. ARB was discontinued on admission. Demadex was discontinued and gentle IV fluids were ordered as recommended by Dr.Befekadu. However, for some reason, IV fluids were not started. The abdominal ultrasound on  06/15/2011 revealed normal renal echogenicity and no hydronephrosis.  Possible Cirrhosis, noted per ultrasound. Her hepatitis B surface antigen and hepatitis C antibody were both negative.  Hyponatremia. Her serum sodium has decreased over the past 2 days. This may be secondary to overdiuresis and hypovolemic hyponatremia. As above.  Hypertension. Her blood pressures have been labile. Both clonidine and labetalol have been titrated downward. She is being maintained on verapamil at 360 mg daily.  Chronic atrial fibrillation. Her rate is controlled. Her INR is now in the therapeutic range. Lovenox was discontinued by the pharmacist to  Type 2 diabetes mellitus. Currently controlled. Her A1c is 6.7.  Iron deficiency anemia. On iron supplementation. Vitamin B12 result pending.  Thrombocytopenia. Her platelet count has decreased since admission. This will be monitored while she is on full dose Lovenox.  Dry cough and nasal congestion.   Plan:  Restart IV fluids as recommended by Dr. Kristian Covey. Continue to hold Demadex. Continue monitoring daily weights and ins and outs.       LOS: 8 days   Onofre Gains 06/22/2011, 2:11 PM

## 2011-06-22 NOTE — Progress Notes (Addendum)
Physical Therapy Treatment Patient Details Name: Krystal [name] MRN: [id number] DOB: 12-14-1945 Today's Date: 06/22/2011 Time: 930-955 Charges: Gait x 18' PT Assessment/Plan  PT - Assessment/Plan Comments on Treatment Session: Pt able to walk increased distance this session. Pt does require intermittent standing rest breaks. All goals have been met. PT Goals  Acute Rehab PT Goals PT Goal: Ambulate - Progress: Met  PT Treatment Mobility (including Balance) Bed Mobility Bed Mobility: No Transfers Transfers: Yes Sit to Stand: 7: Independent Stand to Sit: 7: Independent Ambulation/Gait Ambulation/Gait: Yes Ambulation/Gait Assistance: 6: Modified independent (Device/Increase time) Ambulation Distance (Feet): 150 Feet (w/ intermittent rest breaks) Assistive device: None Gait Pattern: Within Functional Limits Stairs: No Wheelchair Mobility Wheelchair Mobility: No   End of Session PT - End of Session Equipment Utilized During Treatment: Gait belt Activity Tolerance: Patient tolerated treatment well Patient left: with call bell in reach;in chair General Behavior During Session: [facility] for tasks performed Cognition: [facility] for tasks performed  Antonieta Iba 06/22/2011, 10:06 AM

## 2011-06-22 NOTE — Progress Notes (Signed)
Subjective: Interval History: has no complaint of vomiting. She is feeling better she'll have any difficulty in breathing.  Objective: Vital signs in last 24 hours: Temp:  [97.7 F (36.5 C)-98.5 F (36.9 C)] 97.7 F (36.5 C) (01/08 0550) Pulse Rate:  [67-77] 67  (01/08 0550) Resp:  [16-22] 22  (01/08 0550) BP: (100-120)/(60-76) 100/60 mmHg (01/08 0649) SpO2:  [94 %-97 %] 97 % (01/08 0550) Weight change:   Intake/Output from previous day: 01/07 0701 - 01/08 0700 In: 240 [P.O.:240] Out: -  Intake/Output this shift: Total I/O In: 600 [P.O.:600] Out: -   General appearance: alert, cooperative and no distress Resp: clear to auscultation bilaterally Cardio: regular rate and rhythm, S1, S2 normal, no murmur, click, rub or gallop GI: soft, non-tender; bowel sounds normal; no masses,  no organomegaly Extremities: edema Trace to 1+ edema  Lab Results:  [facility] [address] 06/22/11 0527 06/21/11 0533  WBC 7.8 8.0  HGB 9.6* 10.1*  HCT 29.8* 32.1*  PLT 144* 146*   BMET:  Basename 06/22/11 0527 06/21/11 0533  NA 133* 131*  K 4.2 3.6  CL 94* 90*  CO2 28 29  GLUCOSE 105* 99  BUN 78* 73*  CREATININE 3.85* 3.26*  CALCIUM 9.3 9.8   No results found for this basename: PTH:2 in the last 72 hours Iron Studies: No results found for this basename: IRON,TIBC,TRANSFERRIN,FERRITIN in the last 72 hours  Studies/Results: No results found.  I have reviewed the patient's current medications.  Assessment/Plan: Problem #1 renal failure her chronic her BUN is 78 and creatinine 3.85 possibly from diuretics. Demadex was DC'd and patient was put on IV fluid and a presently she is now getting better. Problem #2 hyponatremia  sodium is  improving Problem #3 history of hypertension her blood pressure seems to be controlled very well Problem #4 history of atrial fibrillation heart rate is controlled Problem #5 history of obesity Problem #6 history of CHF patient clinically has improved. Problem #7  history of possible chronic liver disease. Problem #8 history of anemia her H&H is slightly low but stable. Plan: We'll continue his present management We'll use her Demadex on when necessary basis but for now we'll hold We'll check her basic metabolic panel in the morning.    LOS: 8 days   Jessamine Barcia S 06/22/2011,12:06 PM

## 2011-06-23 ENCOUNTER — Inpatient Hospital Stay (HOSPITAL_COMMUNITY): Payer: Medicaid Other

## 2011-06-23 LAB — BASIC METABOLIC PANEL
CO2: 27 mEq/L (ref 19–32)
Calcium: 9.3 mg/dL (ref 8.4–10.5)
Creatinine, Ser: 4.54 mg/dL — ABNORMAL HIGH (ref 0.50–1.10)
GFR calc non Af Amer: 9 mL/min — ABNORMAL LOW (ref >90)
Glucose, Bld: 126 mg/dL — ABNORMAL HIGH (ref 70–99)
Sodium: 129 mEq/L — ABNORMAL LOW (ref 135–145)

## 2011-06-23 LAB — GLUCOSE, CAPILLARY
Glucose-Capillary: 87 mg/dL (ref 70–99)
Glucose-Capillary: 176 mg/dL — ABNORMAL HIGH (ref 70–99)
Glucose-Capillary: 171 mg/dL — ABNORMAL HIGH (ref 70–99)

## 2011-06-23 MED ORDER — OCTREOTIDE ACETATE 100 MCG/ML IJ SOLN
INTRAMUSCULAR | Status: AC
  Filled 2011-06-23: qty 1

## 2011-06-23 MED ORDER — SODIUM CHLORIDE 0.9 % IJ SOLN
INTRAMUSCULAR | Status: AC
  Administered 2011-06-23: 3 mL
  Filled 2011-06-23: qty 3

## 2011-06-23 MED ORDER — FUROSEMIDE 10 MG/ML IJ SOLN
40.0000 mg | Freq: Every day | INTRAMUSCULAR | Status: DC
  Administered 2011-06-23 – 2011-06-24 (×2): 40 mg via INTRAVENOUS
  Filled 2011-06-23 (×2): qty 4

## 2011-06-23 MED ORDER — MIDODRINE HCL 5 MG PO TABS
10.0000 mg | ORAL_TABLET | Freq: Three times a day (TID) | ORAL | Status: DC
  Filled 2011-06-23 (×5): qty 2

## 2011-06-23 MED ORDER — HYDROCOD POLST-CHLORPHEN POLST 10-8 MG/5ML PO LQCR
5.0000 mL | Freq: Two times a day (BID) | ORAL | Status: DC | PRN
  Administered 2011-06-23 – 2011-06-24 (×3): 5 mL via ORAL
  Filled 2011-06-23 (×3): qty 5

## 2011-06-23 MED ORDER — WARFARIN SODIUM 6 MG PO TABS
6.0000 mg | ORAL_TABLET | Freq: Once | ORAL | Status: AC
  Administered 2011-06-23: 6 mg via ORAL
  Filled 2011-06-23: qty 1

## 2011-06-23 MED ORDER — VERAPAMIL HCL ER 240 MG PO TBCR
120.0000 mg | EXTENDED_RELEASE_TABLET | ORAL | Status: DC
  Administered 2011-06-24 – 2011-06-25 (×2): 120 mg via ORAL
  Filled 2011-06-23 (×2): qty 1

## 2011-06-23 MED ORDER — OCTREOTIDE ACETATE 100 MCG/ML IJ SOLN
100.0000 ug | Freq: Three times a day (TID) | INTRAMUSCULAR | Status: DC
  Administered 2011-06-23 – 2011-06-24 (×2): 100 ug via SUBCUTANEOUS
  Filled 2011-06-23 (×6): qty 1

## 2011-06-23 MED ORDER — LABETALOL HCL 200 MG PO TABS
100.0000 mg | ORAL_TABLET | Freq: Two times a day (BID) | ORAL | Status: DC
  Administered 2011-06-23 – 2011-06-25 (×4): 100 mg via ORAL
  Filled 2011-06-23 (×3): qty 1

## 2011-06-23 NOTE — Progress Notes (Signed)
UR Chart Review Completed  
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
## 2011-06-23 NOTE — Progress Notes (Signed)
Subjective: The patient says that she coughed all night, mostly nonproductive.  Objective: Vital signs in last 24 hours: Filed Vitals:   06/23/11 0624 06/23/11 0625 06/23/11 0658 06/23/11 1101  BP: 112/72 97/63 112/73 105/57  Pulse: 64 64  51  Temp:      TempSrc:      Resp:      Height:  5\' 7"  (1.702 m)    Weight:  130.9 kg (288 lb 9.3 oz)    SpO2:        Intake/Output Summary (Last 24 hours) at 06/23/11 1319 Last data filed at 06/23/11 0800  Gross per 24 hour  Intake   1027 ml  Output      0 ml  Net   1027 ml    Weight change: 4.2 kg (9 lb 4.1 oz) Weight on admission 131.5 kg. Weight yesterday day before yesterday 127 kg. Weight today 131 kg.  Physical exam: Lungs: Decreased breath sounds at the bases, otherwise clear. Heart: Distant irregular, irregular. Abdomen: Morbidly obese, positive bowel sounds, nontender, nondistended, question mild ascites. Extremities: Trace of pedal edema bilaterally.  Lab Results: Basic Metabolic Panel:  Basename 06/23/11 0527 06/22/11 0527  NA 129* 133*  K 5.1 4.2  CL 92* 94*  CO2 27 28  GLUCOSE 126* 105*  BUN 87* 78*  CREATININE 4.54* 3.85*  CALCIUM 9.3 9.3  MG -- --  PHOS -- --   Liver Function Tests: No results found for this basename: AST:2,ALT:2,ALKPHOS:2,BILITOT:2,PROT:2,ALBUMIN:2 in the last 72 hours No results found for this basename: LIPASE:2,AMYLASE:2 in the last 72 hours No results found for this basename: AMMONIA:2 in the last 72 hours CBC:  Basename 06/22/11 0527 06/21/11 0533  WBC 7.8 8.0  NEUTROABS -- --  HGB 9.6* 10.1*  HCT 29.8* 32.1*  MCV 89.0 89.4  PLT 144* 146*   Cardiac Enzymes: No results found for this basename: CKTOTAL:3,CKMB:3,CKMBINDEX:3,TROPONINI:3 in the last 72 hours BNP: No results found for this basename: PROBNP:3 in the last 72 hours D-Dimer: No results found for this basename: DDIMER:2 in the last 72 hours CBG:  Basename 06/23/11 1128 06/23/11 0729 06/22/11 2127 06/22/11 1700  06/22/11 1134 06/22/11 0753  GLUCAP 176* 87 181* 196* 171* 87   Hemoglobin A1C: No results found for this basename: HGBA1C in the last 72 hours Fasting Lipid Panel: No results found for this basename: CHOL,HDL,LDLCALC,TRIG,CHOLHDL,LDLDIRECT in the last 72 hours Thyroid Function Tests: No results found for this basename: TSH,T4TOTAL,FREET4,T3FREE,THYROIDAB in the last 72 hours Anemia Panel:  Basename 06/22/11 0527  VITAMINB12 556  FOLATE --  FERRITIN --  TIBC --  IRON --  RETICCTPCT --   Coagulation:  Basename 06/23/11 0527 06/22/11 0527  LABPROT 26.6* 27.6*  INR 2.41* 2.52*   Urine Drug Screen: Drugs of Abuse  No results found for this basename: labopia,  cocainscrnur,  labbenz,  amphetmu,  thcu,  labbarb    Alcohol Level: No results found for this basename: ETH:2 in the last 72 hours   Micro: No results found for this or any previous visit (from the past 240 hour(s)).  Studies/Results: No results found.  Medications: I have reviewed the patient's current medications.  Assessment: Active Problems:  DIABETES MELLITUS, TYPE II  OBESITY  DYSPNEA  Renal failure, acute  Atrial fibrillation  Right heart failure  HTN (hypertension)  Anemia, iron deficiency  Thrombocytopenia  Diastolic CHF, acute on chronic  Hyponatremia  1. Acute on chronic diastolic congestive heart failure/right heart failure. The patient has adequately diuresed. Her weight  on admission was 131.5 kg and yesterday it was 126.7 kg. Her 2-D echocardiogram from 06/15/2010 reveals preserved LV function with an ejection fraction of 60%. Her TSH is within normal limits at 2.95.  Acute renal failure. Her creatinine is increasing. She may have underlying chronic kidney disease. Nephrologist, Dr. Kristian Covey is following. It  was thought that she had been over diuresed, and therefore Demadex was discontinued and IV fluids were started yesterday. Her creatinine has actually worsened and she has increased her  weight by 4 kg. Until she is seen by nephrology today, will decrease the IV fluids and start Lasix IV. ARB was discontinued on admission. The abdominal ultrasound on 06/15/2011 revealed normal renal echogenicity and no hydronephrosis.  Possible Cirrhosis, noted per ultrasound. Her hepatitis B surface antigen and hepatitis C antibody were both negative.  Hyponatremia. Her serum sodium has decreased over the past 2 days.  Hypertension. Her blood pressures have been labile, but definitely trending down. Will titrate antihypertensive medications down even further.  Chronic atrial fibrillation. Her rate is is trending downward with her blood pressure. I believe it is because of the multiple rate limiting medications and decreased renal clearance. Therefore, parameters will be set for these medications and the doses will be decreased. Her INR is now in the therapeutic range. Lovenox was discontinued by the pharmacist.  Type 2 diabetes mellitus. Currently controlled. Her A1c is 6.7. Her capillary blood glucose is trending down and we'll therefore discontinue long acting glipizide and continue sliding scale NovoLog.  Iron deficiency anemia. On iron supplementation. Vitamin B12 is within normal limits..  Thrombocytopenia. Her platelet count has decreased since admission. This will be monitored.  Dry cough and nasal congestion. We'll check a chest x-ray today to rule out pneumonia or volume overload. We'll continue to treat her symptomatically.   Plan:  We'll decrease IV fluids to 50 cc an hour and restart diuretic therapy with Lasix 40 mg IV daily. Further adjustments will be deferred to nephrology.  We'll discontinue glipizide to avoid symptomatic hypoglycemia.  Will place parameters for heart rate and blood pressure on labetalol, verapamil, and clonidine. All of the doses were decreased.  We'll check a followup chest x-ray.       LOS: 9 days   Krystal [name] 06/23/2011, 1:19 PM

## 2011-06-23 NOTE — Progress Notes (Signed)
GENEVE KIMPEL  MRN: [id number]  DOB/AGE: [birth year] 66 y.o.  Primary Care Physician:Provider Not In System  Admit date: 06/14/2011  Chief Complaint:  Chief Complaint  Patient presents with  . Shortness of Breath    S-Pt presented on  06/14/2011 with  Chief Complaint  Patient presents with  . Shortness of Breath  .    Pt today feels better      . benzonatate  100 mg Oral TID  . cloNIDine  0.1 mg Oral BID  . furosemide  40 mg Intravenous Daily  . insulin aspart  0-5 Units Subcutaneous QHS  . insulin aspart  0-9 Units Subcutaneous TID WC  . labetalol  100 mg Oral BID  . polysaccharide iron  150 mg Oral Daily  . potassium chloride  40 mEq Oral BID  . rosuvastatin  10 mg Oral q1800  . sodium chloride  3 mL Intravenous Q12H  . sodium chloride      . verapamil  120 mg Oral Q0700  . warfarin  6 mg Oral ONCE-1800  . warfarin  6 mg Oral ONCE-1800  . DISCONTD: glipiZIDE  5 mg Oral Q0700  . DISCONTD: labetalol  200 mg Oral BID  . DISCONTD: verapamil  360 mg Oral Q0700         WUJ:WJXBJ from the symptoms mentioned above,there are no other symptoms referable to all systems reviewed.  Physical Exam: Vital signs in last 24 hours: Temp:  [97.8 F (36.6 C)-98.2 F (36.8 C)] 98 F (36.7 C) (01/09 1400) Pulse Rate:  [50-68] 57  (01/09 1400) Resp:  [20] 20  (01/09 1400) BP: (97-121)/(57-73) 105/67 mmHg (01/09 1400) SpO2:  [92 %-99 %] 98 % (01/09 1400) Weight:  [288 lb 9.3 oz (130.9 kg)] 288 lb 9.3 oz (130.9 kg) (01/09 0625) Weight change: 9 lb 4.1 oz (4.2 kg) Last BM Date: 06/21/11  Intake/Output from previous day: 01/08 0701 - 01/09 0700 In: 1387 [P.O.:600; I.V.:787] Out: -  Total I/O In: 480 [P.O.:480] Out: -    Physical Exam: General- pt is awake,alert, oriented to time place and person Resp- No acute REsp distress, CTA B/L NO Rhonchi CVS- S1S2 Irregular in rate and rhythm GIT-Obese, BS+, soft, NT. EXT-2+ LE Edema, NO Cyanosis   Lab  Results: CBC  Basename 06/22/11 0527 06/21/11 0533  WBC 7.8 8.0  HGB 9.6* 10.1*  HCT 29.8* 32.1*  PLT 144* 146*    BMET  Basename 06/23/11 0527 06/22/11 0527  NA 129* 133*  K 5.1 4.2  CL 92* 94*  CO2 27 28  GLUCOSE 126* 105*  BUN 87* 78*  CREATININE 4.54* 3.85*  CALCIUM 9.3 9.3     Creat trend 0.93--0.97 in 2010 0.9                2012 July  Creat During this admission  4.54<==3.86<==3.26<==2.55<==2.25  Sodium 129<==133<==131   MICRO No results found for this or any previous visit (from the past 240 hour(s)).    Lab Results  Component Value Date   CALCIUM 9.3 06/23/2011   PHOS 5.2* 06/17/2011   CXR 06/23/11 Findings: Lung volumes are normal. No consolidative airspace  disease. Lateral view demonstrates blunting of both costophrenic  sulci, which could suggest presence of trace bilateral pleural  effusions. This is new compared to prior study 01/06/2011.  Pulmonary vasculature is normal. Heart size is borderline  enlarged, however, there is a prominent right atrial contour which  could suggest right atrial enlargement. Upper mediastinal contours  are  slightly widened (however, they are distorted by patient  rotation to the right and are overall similar compared to prior  studies). Atherosclerotic calcifications noted in the arch of the  aorta.  IMPRESSION:  1. Trace bilateral pleural effusions. No other radiographic  evidence of acute cardiopulmonary disease.  2. Atherosclerosis.  3. Borderline cardiomegaly with prominent right atrial contour.   ABdO U/s IMPRESSION:  1. Possible cirrhosis with associated mild ascites and bilateral  pleural effusions.  2. Nonspecific gallbladder wall thickening. This may be related  to liver disease and/or incomplete fasting. No gallstones are  identified. There is no sonographic Murphy's sign or biliary  dilatation.  3. No hydronephrosis.   2D echo 06/16/11 Left ventricle: The cavity size was normal. There was  mild concentric hypertrophy. Systolic function was normal. The estimated ejection fraction was 60%. Wall motion was normal; there were no regional wall motion abnormalities. - Ventricular septum: The contour showed mild diastolic flattening. - Aortic valve: Mildly calcified annulus. Trileaflet. - Mitral valve: Mild regurgitation. - Left atrium: The atrium was moderately dilated. - Right ventricle: The cavity size was mildly to moderately dilated. Wall thickness was normal. - Right atrium: The atrium was moderately dilated. - Atrial septum: No defect or patent foramen ovale was identified. - Pericardium, extracardiac: A trivial pericardial effusion was identified posterior to the heart.  Hep B / Hep C Negative   Impression: 1)Renal  AKI secondary to Prerenal issues vs ATN               AKI secondary to CHF( Diastolic + Right sided) - Causing Prerenal decreased perfusion- Cardio renal               AKI secondary to Hypotension                AKI secondary to Cirrhosis causing Hepatorenal               AKI now worsening.-Creat trending up                                  2)CVS              Afib             CHF                Medication-              Was On RAS blockers-              On Diuretics-Lasix              On Calcium Channel Blockers              On Alpha and beta Blockers                On Central Acting Sympatholytics- Clonidine   3)Anemia HGb at goal (9--11)                   Low iron sats  4)Liver- Cirrhosis ?  5)Hyponatremia              Etiology Hypervolemic                6)DM type 2- Followed by primary team   7)Acid base Co2 at goal.    Plan:  1) will suggest to increase  IVF to 122ml/hr - this should help with RHF  2)  Agree with IV lasix - will suggest to increase the dose 40 IV BID- this sgould help with Urine output 3)Will suggest to add Midorine + Octreotide to help with BP and heptorenal issues 4) Will ask for Urine na to see - In both  cardio renal and hepato renal it will be low but in ATn it may be higher 5) Follow [facility] closely      BHUTANI,MANPREET S 06/23/2011, 5:20 PM
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
## 2011-06-24 LAB — GLUCOSE, CAPILLARY
Glucose-Capillary: 155 mg/dL — ABNORMAL HIGH (ref 70–99)
Glucose-Capillary: 151 mg/dL — ABNORMAL HIGH (ref 70–99)
Glucose-Capillary: 148 mg/dL — ABNORMAL HIGH (ref 70–99)

## 2011-06-24 LAB — RENAL FUNCTION PANEL
Albumin: 3.6 g/dL (ref 3.5–5.2)
BUN: 88 mg/dL — ABNORMAL HIGH (ref 6–23)
Calcium: 9.1 mg/dL (ref 8.4–10.5)
Chloride: 90 mEq/L — ABNORMAL LOW (ref 96–112)
Creatinine, Ser: 4.27 mg/dL — ABNORMAL HIGH (ref 0.50–1.10)

## 2011-06-24 LAB — PROTIME-INR: INR: 3.11 — ABNORMAL HIGH (ref 0.00–1.49)

## 2011-06-24 LAB — SODIUM, URINE, RANDOM: Sodium, Ur: 7 mEq/L

## 2011-06-24 LAB — CREATININE, URINE, RANDOM: Creatinine, Urine: 73.86 mg/dL

## 2011-06-24 MED ORDER — WARFARIN SODIUM 1 MG PO TABS: 1.0000 mg | ORAL_TABLET | Freq: Once | ORAL | Status: DC

## 2011-06-24 MED ORDER — POLYSACCHARIDE IRON 150 MG PO CAPS
150.0000 mg | ORAL_CAPSULE | Freq: Every day | ORAL | Status: DC
  Administered 2011-06-24 – 2011-06-25 (×2): 150 mg via ORAL
  Filled 2011-06-24 (×2): qty 1

## 2011-06-24 NOTE — Progress Notes (Signed)
Notified Dr. Sherrie Mustache of patient's left eye being red, swollen, and c/o pain during the night.  At this time she has no complaints.  MD states that she will look her eye.  Will continue to monitor.

## 2011-06-24 NOTE — Progress Notes (Signed)
Patient was told that IV was out of date. Two nurses looked and found nothing they thought they could stick. Patient stated, she refused to be stuck again. Out of date IV has no complications. Patient complains of no pain. IV still needed for IV lasix

## 2011-06-24 NOTE — Plan of Care (Signed)
Problem: Phase I Progression Outcomes Goal: Dyspnea controlled at rest Outcome: Completed/Met Date Met:  06/24/11 Oxygen 95% room air Goal: Pain controlled with appropriate interventions Outcome: Progressing No c/o pain at this time.

## 2011-06-24 NOTE — Progress Notes (Signed)
ANTICOAGULATION CONSULT NOTE - Follow Up Consult  Pharmacy Consult for Coumadin  Indication: atrial fibrillation  Allergies  Allergen Reactions  . Ace Inhibitors   . Actos (Pioglitazone Hydrochloride)   . Celecoxib   . Diovan (Valsartan)     Patient Measurements: Height: 5\' 7"  (170.2 cm) Weight: 294 lb 8.6 oz (133.6 kg) IBW/kg (Calculated) : 61.6    Vital Signs: Temp: 97.5 F (36.4 C) (01/10 1411) Temp src: Oral (01/10 0546) BP: 117/66 mmHg (01/10 1411) Pulse Rate: 64  (01/10 1411)  Labs:  Basename 06/24/11 0813 06/24/11 0600 06/23/11 0527 06/22/11 0527  HGB -- -- -- 9.6*  HCT -- -- -- 29.8*  PLT -- -- -- 144*  APTT -- -- -- --  LABPROT -- 32.5* 26.6* 27.6*  INR -- 3.11* 2.41* 2.52*  HEPARINUNFRC -- -- -- --  CREATININE 4.27* -- 4.54* 3.85*  CKTOTAL -- -- -- --  CKMB -- -- -- --  TROPONINI -- -- -- --   Estimated Creatinine Clearance: 19 ml/min (by C-G formula based on Cr of 4.27).   Medications:  Scheduled:    . benzonatate  100 mg Oral TID  . cloNIDine  0.1 mg Oral BID  . furosemide  40 mg Intravenous Daily  . insulin aspart  0-5 Units Subcutaneous QHS  . insulin aspart  0-9 Units Subcutaneous TID WC  . labetalol  100 mg Oral BID  . octreotide  100 mcg Subcutaneous TID  . polysaccharide iron  150 mg Oral Daily  . rosuvastatin  10 mg Oral q1800  . sodium chloride  3 mL Intravenous Q12H  . sodium chloride      . verapamil  120 mg Oral Q0700  . warfarin  6 mg Oral ONCE-1800  . DISCONTD: midodrine  10 mg Oral TID WC  . DISCONTD: polysaccharide iron  150 mg Oral Daily  . DISCONTD: potassium chloride  40 mEq Oral BID  . DISCONTD: warfarin  1 mg Oral ONCE-1800    Assessment: Dr. Sherrie Mustache called and requested Coumadin be held for today. Dr. Sherrie Mustache to contact pharmacy if Coumadin to be given or held again tomorrow.  Goal of Therapy:  INR 2-3   Plan:  Coumadin order in computer for today has been discontinued.  No doses scheduled to be given  today   Raquel James, Enzo Treu Bennett 06/24/2011,2:53 PM

## 2011-06-24 NOTE — Progress Notes (Signed)
Discussed with the patient about the importance of changing her IV site.  Verbalized to her the risk of infection.  She c/o pain at the site minimal,.  She states that she does not want her IV changed at this time.  Minimal redness noted.  Will continue to monitor.
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
## 2011-06-24 NOTE — Progress Notes (Signed)
Subjective: The patient complained of some mild left eye discomfort last night. She woke up this morning and her left thigh was swollen and red. She does not remember any trauma. She denies difficulty seeing out of the eye. She denies photophobia. She denies current pain.  Objective: Vital signs in last 24 hours: Filed Vitals:   06/24/11 0546 06/24/11 0548 06/24/11 0549 06/24/11 1411  BP: 133/64 122/74 128/81 117/66  Pulse: 66 84 83 64  Temp: 98.3 F (36.8 C)   97.5 F (36.4 C)  TempSrc: Oral     Resp: 18 18 18 20   Height:      Weight: 133.6 kg (294 lb 8.6 oz)     SpO2: 98%   97%    Intake/Output Summary (Last 24 hours) at 06/24/11 1600 Last data filed at 06/24/11 1200  Gross per 24 hour  Intake 2590.33 ml  Output    400 ml  Net 2190.33 ml    Weight change: 2.7 kg (5 lb 15.2 oz) Weight on admission 131.5 kg. weight today 133.6 kg.  Physical exam: Left eye with mild periorbital edema and erythema. Intense erythema in the lower aspect of her left conjunctiva which appears to be some pooling of blood. Her sclera is white. Extraocular movement intact. No photophobia. Pupillary reflex intact.  Lungs: Decreased breath sounds at the bases, otherwise clear. Heart: Distant irregular, irregular. Abdomen: Morbidly obese, positive bowel sounds, nontender, nondistended, question mild ascites. Extremities: Trace of pedal edema bilaterally.  Lab Results: Basic Metabolic Panel:  Basename 06/24/11 0813 06/23/11 0527  NA 126* 129*  K 5.3* 5.1  CL 90* 92*  CO2 24 27  GLUCOSE 143* 126*  BUN 88* 87*  CREATININE 4.27* 4.54*  CALCIUM 9.1 9.3  MG -- --  PHOS 5.9* --   Liver Function Tests:  Basename 06/24/11 0813  AST --  ALT --  ALKPHOS --  BILITOT --  PROT --  ALBUMIN 3.6   No results found for this basename: LIPASE:2,AMYLASE:2 in the last 72 hours No results found for this basename: AMMONIA:2 in the last 72 hours CBC:  Basename 06/22/11 0527  WBC 7.8  NEUTROABS --  HGB  9.6*  HCT 29.8*  MCV 89.0  PLT 144*   Cardiac Enzymes: No results found for this basename: CKTOTAL:3,CKMB:3,CKMBINDEX:3,TROPONINI:3 in the last 72 hours BNP: No results found for this basename: PROBNP:3 in the last 72 hours D-Dimer: No results found for this basename: DDIMER:2 in the last 72 hours CBG:  Basename 06/24/11 1116 06/24/11 0731 06/23/11 2048 06/23/11 1648 06/23/11 1128 06/23/11 0729  GLUCAP 155* 127* 171* 158* 176* 87   Hemoglobin A1C: No results found for this basename: HGBA1C in the last 72 hours Fasting Lipid Panel: No results found for this basename: CHOL,HDL,LDLCALC,TRIG,CHOLHDL,LDLDIRECT in the last 72 hours Thyroid Function Tests: No results found for this basename: TSH,T4TOTAL,FREET4,T3FREE,THYROIDAB in the last 72 hours Anemia Panel:  Basename 06/22/11 0527  VITAMINB12 556  FOLATE --  FERRITIN --  TIBC --  IRON --  RETICCTPCT --   Coagulation:  Basename 06/24/11 0600 06/23/11 0527  LABPROT 32.5* 26.6*  INR 3.11* 2.41*   Urine Drug Screen: Drugs of Abuse  No results found for this basename: labopia,  cocainscrnur,  labbenz,  amphetmu,  thcu,  labbarb    Alcohol Level: No results found for this basename: ETH:2 in the last 72 hours   Micro: No results found for this or any previous visit (from the past 240 hour(s)).  Studies/Results: Dg Chest 2  View  06/23/2011  *RADIOLOGY REPORT*  Clinical Data: Lower extremity swelling.  Evaluate for potential pulmonary edema.  CHEST - 2 VIEW  Comparison: Chest x-ray 06/14/2011.  Findings: Lung volumes are normal.  No consolidative airspace disease. Lateral view demonstrates blunting of both costophrenic sulci, which could suggest presence of trace bilateral pleural effusions.  This is new compared to prior study 01/06/2011. Pulmonary vasculature is normal.  Heart size is borderline enlarged, however, there is a prominent right atrial contour which could suggest right atrial enlargement.  Upper mediastinal  contours are slightly widened (however, they are distorted by patient rotation to the right and are overall similar compared to prior studies).  Atherosclerotic calcifications noted in the arch of the aorta.  IMPRESSION:  1. Trace bilateral pleural effusions.  No other radiographic evidence of acute cardiopulmonary disease. 2.  Atherosclerosis. 3.  Borderline cardiomegaly with prominent right atrial contour.  Original Report Authenticated By: Krystal [name], M.D.    Medications: I have reviewed the patient's current medications.  Assessment: Active Problems:  DIABETES MELLITUS, TYPE II  OBESITY  DYSPNEA  Renal failure, acute  Atrial fibrillation  Right heart failure  HTN (hypertension)  Anemia, iron deficiency  Thrombocytopenia  Diastolic CHF, acute on chronic  Hyponatremia  1. Acute on chronic diastolic congestive heart failure/right heart failure.. She continues to have peripheral edema and increasing weight gain after a trial of IV fluids. Her 2-D echocardiogram from 06/15/2010 revealed preserved LV function with an ejection fraction of 60%. Her TSH is within normal limits at 2.95.  Acute renal failure. Her creatinine is increasing. Nephrologist, Dr. Kristian [name] is following. It  was thought that she had been over diuresed, and therefore Demadex was discontinued and IV fluids were started. Her creatinine has actually worsened and she has increased her weight. IV Lasix has been restarted. Free water restriction has been ordered by nephrology. Octreotide was started as well. The abdominal ultrasound on 06/15/2011 revealed normal renal echogenicity and no hydronephrosis.  Hyperkalemia secondary to worsening renal function and potassium chloride supplementation. Potassium chloride supplementation has been discontinued.  Possible Cirrhosis, noted per ultrasound. Her hepatitis B surface antigen and hepatitis C antibody were both negative.  Chronic atrial fibrillation. Her rate is is trending  downward with her blood pressure. I believe it is because of the multiple rate limiting medications and decreased renal clearance. Therefore, parameters will be set for these medications and the doses will be decreased. Her INR is now in the therapeutic range. Lovenox was discontinued by the pharmacist.  Hyponatremia. Her serum sodium has decreased over the past 2 days.   Hypertension. Her blood pressures have been labile, but definitely trending down. doses of antihypertensive medications have been titrated down. Midodrine was started by nephrology but has since been discontinued.  Type 2 diabetes mellitus. Currently controlled. Her A1c is 6.7. Her capillary blood glucose is trending down and we'll therefore discontinue long acting glipizide and continue sliding scale NovoLog.  Iron deficiency anemia. On iron supplementation. Vitamin B12 is within normal limits..  Thrombocytopenia. Her platelet count has decreased since admission. This will be monitored.  Dry cough and nasal congestion. Her chest x-ray is about the same.Maryclare Labrador continue to treat her symptomatically.  Possible conjunctival hemorrhage in the setting of anticoagulation. I will hold Coumadin for today. This was discussed with the pharmacist. I have consulted ophthalmologist Dr. Lita Mains. Will hold on antibiotic therapy until he evaluates her.  Plan:  Hold Coumadin for today. Ophthalmology consultation pending. Further recommendations per nephrology.  LOS: 10 days   Krystal [name] 06/24/2011, 4:00 PM

## 2011-06-25 ENCOUNTER — Encounter (HOSPITAL_COMMUNITY): Payer: Self-pay | Admitting: Internal Medicine

## 2011-06-25 DIAGNOSIS — H1132 Conjunctival hemorrhage, left eye: Secondary | ICD-10-CM | POA: Diagnosis not present

## 2011-06-25 LAB — RENAL FUNCTION PANEL
BUN: 81 mg/dL — ABNORMAL HIGH (ref 6–23)
CO2: 22 mEq/L (ref 19–32)
Calcium: 8.9 mg/dL (ref 8.4–10.5)
Creatinine, Ser: 3.64 mg/dL — ABNORMAL HIGH (ref 0.50–1.10)
GFR calc non Af Amer: 12 mL/min — ABNORMAL LOW (ref >90)

## 2011-06-25 LAB — GLUCOSE, CAPILLARY
Glucose-Capillary: 131 mg/dL — ABNORMAL HIGH (ref 70–99)
Glucose-Capillary: 159 mg/dL — ABNORMAL HIGH (ref 70–99)

## 2011-06-25 LAB — CBC
HCT: 28 % — ABNORMAL LOW (ref 36.0–46.0)
Hemoglobin: 9.1 g/dL — ABNORMAL LOW (ref 12.0–15.0)
MCH: 28.6 pg (ref 26.0–34.0)
MCHC: 32.5 g/dL (ref 30.0–36.0)
MCV: 88.1 fL (ref 78.0–100.0)

## 2011-06-25 LAB — PROTIME-INR: INR: 3.08 — ABNORMAL HIGH (ref 0.00–1.49)

## 2011-06-25 MED ORDER — KETOROLAC TROMETHAMINE 0.5 % OP SOLN
1.0000 [drp] | Freq: Four times a day (QID) | OPHTHALMIC | Status: DC
  Filled 2011-06-25: qty 3

## 2011-06-25 MED ORDER — BENZONATATE 100 MG PO CAPS: 100.0000 mg | ORAL_CAPSULE | Freq: Three times a day (TID) | ORAL | Status: AC | PRN

## 2011-06-25 MED ORDER — FUROSEMIDE 80 MG PO TABS
100.0000 mg | ORAL_TABLET | Freq: Every day | ORAL | Status: DC
  Administered 2011-06-25: 100 mg via ORAL
  Filled 2011-06-25: qty 1

## 2011-06-25 MED ORDER — KETOROLAC TROMETHAMINE 0.5 % OP SOLN: 1.0000 [drp] | Freq: Four times a day (QID) | OPHTHALMIC | Status: AC

## 2011-06-25 MED ORDER — WARFARIN SODIUM 1 MG PO TABS: 1.0000 mg | ORAL_TABLET | ORAL | Status: DC

## 2011-06-25 MED ORDER — FUROSEMIDE 40 MG PO TABS: ORAL_TABLET | ORAL | Status: DC

## 2011-06-25 MED ORDER — KETOROLAC TROMETHAMINE 0.5 % OP SOLN
1.0000 [drp] | Freq: Four times a day (QID) | OPHTHALMIC | Status: DC
  Administered 2011-06-25: 1 [drp] via OPHTHALMIC

## 2011-06-25 MED ORDER — POLYSACCHARIDE IRON 150 MG PO CAPS: 150.0000 mg | ORAL_CAPSULE | Freq: Every day | ORAL | Status: DC

## 2011-06-25 MED ORDER — VERAPAMIL HCL 120 MG PO TBCR: 120.0000 mg | EXTENDED_RELEASE_TABLET | ORAL | Status: DC
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
## 2011-06-25 NOTE — Discharge Summary (Signed)
Physician Discharge Summary  Krystal [name] MRN: [id number] DOB/AGE: 65/12/1945 65 y.o.  PCP: [facility] CARDIOLOGIST: Chesterbrook Bing, M.D.   Admit date: 06/14/2011 Discharge date: 06/25/2011  Discharge Diagnoses:  1. 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 Results of the 2-D echocardiogram is noted below. Her weight on admission was recorded as 131.5 kg. It decreased to a nadir of 126.7 kg. At the time of discharge, it was 133.3 kg. Accuracy of weights was suspect. 2. Newly diagnosed atrial fibrillation. Anticoagulation started with Coumadin. Her INR was 3.08 at the time of discharge. 3. Acute renal failure. Etiology not clearly elucidated. In July of 2012, her creatinine was 0.8. Her creatinine was 2.60 on admission and 3.64 at the time of discharge. 4. Hyperkalemia associated with acute renal failure. Her serum potassium was 5.2 on admission and 3.8 at the time of discharge. 5. Hyponatremia, associated with various changes in her renal function and diuretic/fluid therapy. At the time of discharge, her sodium was 130. 6. Hypokalemia secondary to diuretic therapy. Resolved. 7. Possible cirrhosis per ultrasound. Associated mild ascites. Hepatitis serologies were negative. Further evaluation and referrals were deferred to the outpatient setting. 8. Thrombocytopenia. Resolved. 9. Iron deficiency anemia. Her total iron was 18, TIBC 381, ferritin 68, and vitamin B12 556. 10. Type 2 diabetes mellitus. Her hemoglobin A1c was 6.7. 11. Left conjunctival hemorrhage in the setting of anticoagulation. Evaluated by ophthalmologist, Dr. Lita Mains. 12. Morbid obesity. 13. Relative hypotension and bradycardia, necessitating changes in medications prior to discharge. 14. Dyspnea and reported 25 pound plus weight gain as presenting symptomatology.     Discharge Medication List as of 06/25/2011  2:17 PM    START taking these medications   Details  benzonatate (TESSALON)  100 MG capsule Take 1 capsule (100 mg total) by mouth 3 (three) times daily as needed for cough., Starting 06/25/2011, Until Fri 07/02/11, Print    ketorolac (ACULAR) 0.5 % ophthalmic solution Place 1 drop into the left eye 4 (four) times daily., Starting 06/25/2011, Until Mon 07/05/11, Print    polysaccharide iron (NIFEREX) 150 MG CAPS capsule Take 1 capsule (150 mg total) by mouth daily., Starting 06/25/2011, Until Discontinued, Print    warfarin (COUMADIN) 1 MG tablet Take 1 tablet (1 mg total) by mouth as directed. THIS DOSE MAY BE INCREASED OR DECREASED, BUT TAKE 1 TABLET EACH EVENING UNTIL YOU FOLLOW UP AT THE COUMADIN CLINIC., Starting 06/25/2011, Until Sat 06/24/12, Print      CONTINUE these medications which have CHANGED   Details  furosemide (LASIX) 40 MG tablet TAKE 2 AND 1/2 TABLETS DAILY AS NEEDED WHEN YOUR WEIGHT INCREASES MORE THAN 2 POUNDS IN 1 DAY OR MORE THAN 5 POUNDS IN 1 WEEK., Print    verapamil (CALAN-SR) 120 MG CR tablet Take 1 tablet (120 mg total) by mouth every morning., Starting 06/25/2011, Until Sat 06/24/12, Print      CONTINUE these medications which have NOT CHANGED   Details  acetaminophen (TYLENOL) 500 MG tablet Take 500 mg by mouth as needed. For pain , Until Discontinued, Historical Med    allopurinol (ZYLOPRIM) 100 MG tablet Take 100 mg by mouth daily.  , Until Discontinued, Historical Med    atorvastatin (LIPITOR) 20 MG tablet Take 20 mg by mouth at bedtime.  , Until Discontinued, Historical Med    glipiZIDE (GLUCOTROL XL) 5 MG 24 hr tablet Take 5 mg by mouth every morning. , Until Discontinued, Historical Med    SALINE NASAL MIST NA  Place 1 spray into the nose as needed. For congestion , Until Discontinued, Historical Med      STOP taking these medications     candesartan (ATACAND) 32 MG tablet      cloNIDine (CATAPRES) 0.2 MG tablet      labetalol (NORMODYNE) 300 MG tablet      metFORMIN (GLUCOPHAGE) 1000 MG tablet         Discharge Condition:  Improved and stable.  Disposition: Home or Self Care   Consults: Cardiologist, Dr. Dietrich Pates. Nephrologist, Dr. Kristian Covey. Ophthalmologist, Dr. Lita Mains.   Significant Diagnostic Studies: Dg Chest 2 View  06/23/2011  *RADIOLOGY REPORT*  Clinical Data: Lower extremity swelling.  Evaluate for potential pulmonary edema.  CHEST - 2 VIEW  Comparison: Chest x-ray 06/14/2011.  Findings: Lung volumes are normal.  No consolidative airspace disease. Lateral view demonstrates blunting of both costophrenic sulci, which could suggest presence of trace bilateral pleural effusions.  This is new compared to prior study 01/06/2011. Pulmonary vasculature is normal.  Heart size is borderline enlarged, however, there is a prominent right atrial contour which could suggest right atrial enlargement.  Upper mediastinal contours are slightly widened (however, they are distorted by patient rotation to the right and are overall similar compared to prior studies).  Atherosclerotic calcifications noted in the arch of the aorta.  IMPRESSION:  1. Trace bilateral pleural effusions.  No other radiographic evidence of acute cardiopulmonary disease. 2.  Atherosclerosis. 3.  Borderline cardiomegaly with prominent right atrial contour.  Original Report Authenticated By: Florencia Reasons, M.D.   US Abdomen Complete  06/15/2011  *RADIOLOGY REPORT*  Clinical Data:  Acute renal failure with increasing abdominal girth.  Evaluate for hydronephrosis and ascites.  COMPLETE ABDOMINAL ULTRASOUND  Comparison:  None  Findings:  Examination is mildly limited by body habitus.  In addition, the patient ate before the examination.  The gallbladder is contracted.  Gallbladder: The gallbladder is partly contracted.  There is probable gallbladder wall thickening to 5.5 mm.  No gallstones are identified.  Sonographic Murphy's sign is absent.  Common bile duct:   Normal in caliber without filling defects.  Liver:  The hepatic echogenicity is diffusely increased.   The liver contours appear slightly irregular.  No focal lesions are identified.  IVC:  Visualized portions appear unremarkable.  Pancreas:  Visualized portions appear unremarkable.Portions of the pancreas are obscured by bowel gas.  Spleen:  Visualized portions appear unremarkable.  Right Kidney:   The renal cortical thickness and echogenicity are preserved.  There is no hydronephrosis or focal abnormality. Renal length is 12.8 cm.  Left Kidney:   The renal cortical thickness and echogenicity are preserved.  There is no hydronephrosis or focal abnormality. Renal length is 12.5 cm.  Abdominal aorta:  Visualized portions appear unremarkable.  Small bilateral pleural effusions are noted.  There is a small amount of ascites, primarily adjacent to the liver.  IMPRESSION:  1.  Possible cirrhosis with associated mild ascites and bilateral pleural effusions. 2.  Nonspecific gallbladder wall thickening.  This may be related to liver disease and/or incomplete fasting.  No gallstones are identified.  There is no sonographic Murphy's sign or biliary dilatation. 3.  No hydronephrosis.  Original Report Authenticated By: Gerrianne Scale, M.D.   US Carotid Duplex Bilateral  06/17/2011  *RADIOLOGY REPORT*  Clinical Data: Carotid bruit; history of syncopal episode, diabetes and hypertension, history of arrhythmia  BILATERAL CAROTID DUPLEX ULTRASOUND  Technique: Gray scale imaging, color Doppler and duplex ultrasound was performed of  bilateral carotid and vertebral arteries in the neck.  Comparison:  None.  Criteria:  Quantification of carotid stenosis is based on velocity parameters that correlate the residual internal carotid diameter with NASCET-based stenosis levels, using the diameter of the distal internal carotid lumen as the denominator for stenosis measurement.  The following velocity measurements were obtained:                   PEAK SYSTOLIC/END DIASTOLIC RIGHT ICA:                        97/15cm/sec CCA:                         85/16cm/sec SYSTOLIC ICA/CCA RATIO:     1.14 DIASTOLIC ICA/CCA RATIO:    0.90 ECA:                        01/12cm/sec  LEFT ICA:                        94/14cm/sec CCA:                        99/15cm/sec SYSTOLIC ICA/CCA RATIO:     0.95 DIASTOLIC ICA/CCA RATIO:    0.90 ECA:                        96cm/sec  Findings:  RIGHT CAROTID ARTERY: There is minimal intimal wall thickening within the carotid bulb, extending to the proximal aspect of the internal carotid artery, not resulting in hemodynamically significant stenosis.  RIGHT VERTEBRAL ARTERY:  Preserved antegrade flow.  LEFT CAROTID ARTERY: There is a minimal amount of eccentric atherosclerotic plaque within the carotid bulb, not resulting in hemodynamically significant stenosis. Minimal intimal wall thickening within the proximal aspect of the internal carotid artery.  LEFT VERTEBRAL ARTERY:  Preserved antegrade flow.  IMPRESSION: Minimal intimal wall thickening and atherosclerotic plaque within the bilateral carotid bulbs (left greater than right) and proximal aspect of the bilateral internal carotid arteries, not resulting in hemodynamically significant stenosis.  Original Report Authenticated By: Waynard Reeds, M.D.   Dg Chest Port 1 View  06/14/2011  *RADIOLOGY REPORT*  Clinical Data: Shortness of breath, hypertension, history of uterine cancer  PORTABLE CHEST - 1 VIEW  Comparison: 01/06/2011  Findings: Cardiomegaly again noted.  No acute infiltrate or pleural effusion.  No pulmonary edema.  IMPRESSION: Cardiomegaly again noted.  No active disease.  Original Report Authenticated By: Natasha Mead, M.D.   ECHO:Study Conclusions  - Left ventricle: The cavity size was normal. There was mild concentric hypertrophy. Systolic function was normal. The estimated ejection fraction was 60%. Wall motion was normal; there were no regional wall motion abnormalities. - Ventricular septum: The contour showed mild diastolic flattening. - Aortic valve:  Mildly calcified annulus. Trileaflet. - Mitral valve: Mild regurgitation. - Left atrium: The atrium was moderately dilated. - Right ventricle: The cavity size was mildly to moderately dilated. Wall thickness was normal. - Right atrium: The atrium was moderately dilated. - Atrial septum: No defect or patent foramen ovale was identified. - Pericardium, extracardiac: A trivial pericardial effusion was identified posterior to the heart. Impressions:  - Compared to the prior study performed 10/23/2008, estimated RV systolic pressure has decreased to normal or at least near-normal. Transthoracic echocardiography. M-mode, complete 2D, spectral Doppler, and  color Doppler. Height: Height: 170.2cm. Height: 67in. Weight: Weight: 132.9kg. Weight: 292.4lb. Body mass index: BMI: 45.9kg/m^2. Body surface area: BSA: 2.53m^2. Patient status: Inpatient. Location: Bedside.      Microbiology: No results found for this or any previous visit (from the past 240 hour(s)).   Labs: Results for orders placed during the hospital encounter of 06/14/11 (from the past 48 hour(s))  GLUCOSE, CAPILLARY     Status: Abnormal   Collection Time   06/23/11  8:48 PM      Component Value Range Comment   Glucose-Capillary 171 (*) 70 - 99 (mg/dL)    Comment 1 Documented in Chart      Comment 2 Notify RN     CREATININE, URINE, RANDOM     Status: Normal   Collection Time   06/24/11  2:08 AM      Component Value Range Comment   Creatinine, Urine 73.86     SODIUM, URINE, RANDOM     Status: Normal   Collection Time   06/24/11  2:08 AM      Component Value Range Comment   Sodium, Ur 7     PROTIME-INR     Status: Abnormal   Collection Time   06/24/11  6:00 AM      Component Value Range Comment   Prothrombin Time 32.5 (*) 11.6 - 15.2 (seconds)    INR 3.11 (*) 0.00 - 1.49    GLUCOSE, CAPILLARY     Status: Abnormal   Collection Time   06/24/11  7:31 AM      Component Value Range Comment   Glucose-Capillary 127 (*) 70  - 99 (mg/dL)    Comment 1 Notify RN     RENAL FUNCTION PANEL     Status: Abnormal   Collection Time   06/24/11  8:13 AM      Component Value Range Comment   Sodium 126 (*) 135 - 145 (mEq/L)    Potassium 5.3 (*) 3.5 - 5.1 (mEq/L)    Chloride 90 (*) 96 - 112 (mEq/L)    CO2 24  19 - 32 (mEq/L)    Glucose, Bld 143 (*) 70 - 99 (mg/dL)    BUN 88 (*) 6 - 23 (mg/dL)    Creatinine, Ser 4.09 (*) 0.50 - 1.10 (mg/dL)    Calcium 9.1  8.4 - 10.5 (mg/dL)    Phosphorus 5.9 (*) 2.3 - 4.6 (mg/dL)    Albumin 3.6  3.5 - 5.2 (g/dL)    GFR calc non Af Amer 10 (*) >90 (mL/min)    GFR calc Af Amer 12 (*) >90 (mL/min)   GLUCOSE, CAPILLARY     Status: Abnormal   Collection Time   06/24/11 11:16 AM      Component Value Range Comment   Glucose-Capillary 155 (*) 70 - 99 (mg/dL)    Comment 1 Notify RN     GLUCOSE, CAPILLARY     Status: Abnormal   Collection Time   06/24/11  5:03 PM      Component Value Range Comment   Glucose-Capillary 151 (*) 70 - 99 (mg/dL)    Comment 1 Notify RN      Comment 2 Documented in Chart     GLUCOSE, CAPILLARY     Status: Abnormal   Collection Time   06/24/11  9:13 PM      Component Value Range Comment   Glucose-Capillary 148 (*) 70 - 99 (mg/dL)    Comment 1 Documented in Chart      Comment  2 Notify RN     PROTIME-INR     Status: Abnormal   Collection Time   06/25/11  5:22 AM      Component Value Range Comment   Prothrombin Time 32.3 (*) 11.6 - 15.2 (seconds)    INR 3.08 (*) 0.00 - 1.49    RENAL FUNCTION PANEL     Status: Abnormal   Collection Time   06/25/11  5:22 AM      Component Value Range Comment   Sodium 130 (*) 135 - 145 (mEq/L)    Potassium 3.8  3.5 - 5.1 (mEq/L) DELTA CHECK NOTED   Chloride 95 (*) 96 - 112 (mEq/L)    CO2 22  19 - 32 (mEq/L)    Glucose, Bld 141 (*) 70 - 99 (mg/dL)    BUN 81 (*) 6 - 23 (mg/dL)    Creatinine, Ser 1.61 (*) 0.50 - 1.10 (mg/dL)    Calcium 8.9  8.4 - 10.5 (mg/dL)    Phosphorus 5.5 (*) 2.3 - 4.6 (mg/dL)    Albumin 3.4 (*) 3.5 - 5.2  (g/dL)    GFR calc non Af Amer 12 (*) >90 (mL/min)    GFR calc Af Amer 14 (*) >90 (mL/min)   CBC     Status: Abnormal   Collection Time   06/25/11  5:22 AM      Component Value Range Comment   WBC 7.2  4.0 - 10.5 (K/uL)    RBC 3.18 (*) 3.87 - 5.11 (MIL/uL)    Hemoglobin 9.1 (*) 12.0 - 15.0 (g/dL)    HCT 09.6 (*) 04.5 - 46.0 (%)    MCV 88.1  78.0 - 100.0 (fL)    MCH 28.6  26.0 - 34.0 (pg)    MCHC 32.5  30.0 - 36.0 (g/dL)    RDW 40.9  81.1 - 91.4 (%)    Platelets 165  150 - 400 (K/uL)   GLUCOSE, CAPILLARY     Status: Abnormal   Collection Time   06/25/11  7:37 AM      Component Value Range Comment   Glucose-Capillary 131 (*) 70 - 99 (mg/dL)    Comment 1 Notify RN     GLUCOSE, CAPILLARY     Status: Abnormal   Collection Time   06/25/11 11:10 AM      Component Value Range Comment   Glucose-Capillary 159 (*) 70 - 99 (mg/dL)    Comment 1 Notify RN        HPI : The patient is a 66 year old woman with a past medical history significant for diastolic congestive heart failure, morbid obesity, hypertension, and type 2 diabetes mellitus. She presented to the emergency department on 06/14/2011 with a chief complaint of progressive shortness of breath and a weight gain of over 25 pounds in one month. In the emergency department, she was noted to be afebrile and hemodynamically stable, although, her blood pressure was in the lower 100s systolically. Her EKG revealed atrial fibrillation which was new for her. Her chest x-ray revealed no acute infiltrate, pleural effusion, or pulmonary edema but it did reveal cardiomegaly. Her lab data were significant for a sodium of 133, potassium of 5.2, BUN of 70, creatinine of 2.60, normal hepatic function panel, normal cardiac enzymes, and a pro BNP of 1323. She was admitted for further evaluation and management.   HOSPITAL COURSE: The patient was given 80 mg of Lasix in the emergency department. Her ARB was discontinued. Lasix was not continued on admission as  it was  felt that the patient was intravascularly depleted and therefore, IV fluids were started instead. Her BUN and creatinine were substantially more elevated than baseline which was apparently normal approximately 6 months ago. She was continued on her rate limiting medications including verapamil and labetalol. Her heart rate was not tachycardic. Lovenox and Coumadin were started for newly diagnosed atrial fibrillation. Metformin was discontinued due to to her acute renal failure. Her diabetes was treated with sliding scale NovoLog and glipizide which she had been treated with prior to admission. The day following admission, it was felt that the patient was volume overloaded and therefore IV fluids were discontinued and she was started on twice a day dosing of intravenous Lasix.  For further evaluation, and abdominal/renal ultrasound was ordered and a 2-D echocardiogram was ordered. The results of the ultrasound revealed possible cirrhosis and mild ascites. The results of the 2-D echocardiogram revealed diastolic flattening, and ejection fraction of 60%, moderate dilatation of the right ventricle, and biatrial dilatation. The patient had no known history of cirrhosis. Hepatitis serologies were ordered and were negative. It was unclear if the patient had cirrhosis and ascites from progressive right heart dysfunction/diastolic dysfunction or if it was an intrinsic abnormality. Her liver transaminases, albumin, and PT were relatively normal with the possible exception of a slight increase in her PT on admission, therefore, it was unlikely that she had any decompensated liver disease. Her TSH was assessed and it was within normal limits at 2.9.   Cardiologist Dr. Dietrich Pates was consulted. He agreed with medical management and with diuretic therapy. He was concerned about the etiology of right heart failure namely pulmonary hypertension and/or pericardial constriction. He ordered an ABG. It revealed a pH of 7.44,  PCO2 of 37.2, and PO2 of 79 on room air.  Nephrologist, Dr. Kristian Covey was consulted. He changed her diuretic therapy from Lasix to Demadex and metolazone. He noted that the abdominal ultrasound revealed no evidence of cortical renal disease and no evidence of hydronephrosis. After several days of diuresis and symptomatic improvement, the patient's creatinine began to increase. Prior to the increase, it did decrease to a nadir of 2.15 before it gradually increased to a high of 4.54. When her creatinine began to increase, nephrology discontinued Demadex and metolazone and started her on gentle IV fluids. Following the gentle IV fluids, her creatinine decreased to 3.64 at the time of discharge. Dr.Befekau stated that the patient is volume status will be difficult to manage because with over diuresis, her renal function worsens and with under diuresis, she becomes volume overloaded. Therefore, at the time of discharge, her diuretic therapy was changed back to oral Lasix. She was instructed to take 100 mg of Lasix prn if her weight increased at least 2 pounds in a 24-hour period or 5 pounds in a week. She voiced understanding.  The patient's INR became therapeutic. It actually came suprapubic therapeutic in the setting of acute renal failure. The dose of Coumadin was titrated down to 1 mg daily and Lovenox was discontinued. Her INR was 3.08 at the time of discharge. She will followup with the Coumadin clinic as scheduled.  The patient developed swelling and erythema of her left eye. Upon evaluation, it appeared that she had a conjunctival hemorrhage. Ophthalmologist, Dr. Lita Mains was consulted. Per his assessment, it was indeed a left conjunctival hemorrhage. He recommended alternating hot and cold compresses for comfort and starting ketorolac ophthalmic drops 0.4% 4 times a day to the left eye to control itching. He did not believe  the NSAID would have any ill effect on her anticoagulation status. The patient was  advised to come back to the emergency department or call her primary care provider if the bleeding worsened.  The patient became hypotensive and bradycardic on several occasions on her chronic rate limiting and antihypertensive medications. For this reason, labetalol and clonidine were discontinued. Atacand was discontinued earlier because of the renal failure. The dose of verapamil was decreased from 360 mg daily to 120 mg daily. This is what she was discharged to home on. Her heart rate and blood pressure will need to be followed closely in the outpatient setting for a possible restart of some of her other medications if her blood pressure began to increase. For several days, prior to hospital discharge, her systolic blood pressure was ranging in the low 90s to low 100s.   During the hospitalization, the patient's weight waxed and waned according to the nursing staff record. Initially, it appeared that she was negative several liters during the first 48 hours of the hospitalization, however, toward the end of the hospitalization, her ins and outs were positive owing to the IV fluids given. Overall, she actually increased her weight from the time that she was admitted based on what was recorded by the nursing staff. Nevertheless, she appeared to be less symptomatic and certainly less edematous clinically.  Prior to discharge, she was ambulating in the room and in the hallway with assistance. She did have some dyspnea but certainly less than what she had before admission. She will followup with nephrology and the Coumadin clinic next week. She will followup with cardiology as well next week.   Discharge Exam: Blood pressure 105/68, pulse 68, temperature 97.7 F (36.5 C), temperature source Oral, resp. rate 18, height 5\' 7"  (1.702 m), weight 133.3 kg (293 lb 14 oz), SpO2 95.00%.  Left eye with mild periorbital erythema and edema. Conjunctival bleeding, small amount, pooling under the bottom  eyelid. Lungs: Decreased breath sounds in the bases otherwise clear. Heart: Distant irregular irregular. Abdomen: Morbidly obese, positive bowel sounds, nontender, nondistended. Extremities: Trace of pedal edema.   Discharge Orders    Future Appointments: Provider: Department: Dept Phone: Center:   06/28/2011 10:05 AM Louanna Raw, RN [facility] [phone] [id number]   07/02/2011 11:00 AM Joni Reining, NP [facility] [phone] [id number]     Future Orders Please Complete By Expires   Diet - low sodium heart healthy      Increase activity slowly      Discharge instructions      Comments:   FOLLOW INSTRUCTIONS ON THE PRESCRIPTION BOTTLE ABOUT HOW TO TAKE LASIX.  SOME OF YOUR MEDICINES HAVE BEEN DISCONTINUED UNTIL YOU SEE YOUR OTHER DOCTORS. TAKE OTHER MEDICATIONS AS PRESCRIBED.      Follow-up Information    Follow up with [facility], MD on 07/01/2011. (10:15)    Contact information:   [address] [place] [address] [phone]       Follow up with [facility] clinic on 06/29/2011. (tuesday at 9:40)       Follow up with coumadin clinic on 06/28/2011. (10:05)       Follow up with labs at [facility] on 06/30/2011. ( wednesday  am)          Discharge time: 45 minutes.   Signed: Houda Brau 06/25/2011, 6:10 PM

## 2011-06-25 NOTE — Progress Notes (Signed)
   CARE MANAGEMENT NOTE 06/25/2011  Patient:  Krystal [name], Krystal [name]   Account Number:  [id number]  Date Initiated:  06/17/2011  Documentation initiated by:  Andi Devon Assessment:   66 yr old female with  fluid overload. lives with her spouse. [facility]. pt is weak  plans to return home     Action/Plan:   Anticipated DC Date:  06/25/2011   Anticipated DC Plan:  HOME/SELF CARE  In-house referral  Financial Counselor      DC Planning Services  CM consult  Medication Assistance      [facility] Choice  DURABLE MEDICAL EQUIPMENT   Choice offered to / List presented to:  C-1 Patient   DME arranged  3-N-1      DME agency  [facility]        Status of service:   Medicare Important Message given?   (If response is "NO", the following Medicare IM given date fields will be blank) Date Medicare IM given:   Date Additional Medicare IM given:    Discharge Disposition:  HOME/SELF CARE  Per UR Regulation:    Comments:  06/25/2011 Mendel Corning rn, bsn pt for d/c home today. followup appointments made for free clinic,coumadin clinic, nephrologist and labs. medications assistance through [employer]. pt has a walker. no other d/c needs

## 2011-06-25 NOTE — Plan of Care (Signed)
Problem: Discharge Progression Outcomes Goal: Other Discharge Outcomes/Goals Outcome: Completed/Met Date Met:  06/25/11 Discharge instructions read to pt and her family they both verbalized understanding of all instructions Case mgt in to see pt about meds .  Pt discharged to home with appts and rx.

## 2011-06-25 NOTE — Progress Notes (Signed)
Subjective: Interval History: has no complaint of shortness of breath. She does have any nausea vomiting. Overall she says she is feeling better..  Objective: Vital signs in last 24 hours: Temp:  [97.3 F (36.3 C)-98.9 F (37.2 C)] 97.7 F (36.5 C) (01/11 0541) Pulse Rate:  [64-107] 68  (01/11 0541) Resp:  [18-20] 18  (01/11 0541) BP: (105-133)/(66-74) 105/68 mmHg (01/11 0541) SpO2:  [95 %-100 %] 95 % (01/11 0541) Weight:  [133.3 kg (293 lb 14 oz)] 133.3 kg (293 lb 14 oz) (01/11 0541) Weight change: -0.3 kg (-10.6 oz)  Intake/Output from previous day: 01/10 0701 - 01/11 0700 In: 2984 [P.O.:1080; I.V.:1900; IV Piggyback:4] Out: 1750 [Urine:1750] Intake/Output this shift:    General appearance: alert, cooperative and no distress Resp: clear to auscultation bilaterally Cardio: regular rate and rhythm, S1, S2 normal, no murmur, click, rub or gallop GI: soft, non-tender; bowel sounds normal; no masses,  no organomegaly Extremities: edema trace to 1+ edema.  Lab Results:  [facility] 06/25/11 0522  WBC 7.2  HGB 9.1*  HCT 28.0*  PLT 165   BMET:  Basename 06/25/11 0522 06/24/11 0813  NA 130* 126*  K 3.8 5.3*  CL 95* 90*  CO2 22 24  GLUCOSE 141* 143*  BUN 81* 88*  CREATININE 3.64* 4.27*  CALCIUM 8.9 9.1   No results found for this basename: PTH:2 in the last 72 hours Iron Studies: No results found for this basename: IRON,TIBC,TRANSFERRIN,FERRITIN in the last 72 hours  Studies/Results: Dg Chest 2 View  06/23/2011  *RADIOLOGY REPORT*  Clinical Data: Lower extremity swelling.  Evaluate for potential pulmonary edema.  CHEST - 2 VIEW  Comparison: Chest x-ray 06/14/2011.  Findings: Lung volumes are normal.  No consolidative airspace disease. Lateral view demonstrates blunting of both costophrenic sulci, which could suggest presence of trace bilateral pleural effusions.  This is new compared to prior study 01/06/2011. Pulmonary vasculature is normal.  Heart size is borderline  enlarged, however, there is a prominent right atrial contour which could suggest right atrial enlargement.  Upper mediastinal contours are slightly widened (however, they are distorted by patient rotation to the right and are overall similar compared to prior studies).  Atherosclerotic calcifications noted in the arch of the aorta.  IMPRESSION:  1. Trace bilateral pleural effusions.  No other radiographic evidence of acute cardiopulmonary disease. 2.  Atherosclerosis. 3.  Borderline cardiomegaly with prominent right atrial contour.  Original Report Authenticated By: Florencia Reasons, M.D.    I have reviewed the patient's current medications.  Assessment/Plan: Problem #1 acute kidney injury superimposed on chronic her BUN is 81 creatinine 3.64 presently improving. Problem #2 history of hyponatremia sodium 130 that also seems to be improving. Problem #3 history of hyperkalemia potassium 3.8 Problem #4 history of hypertension her blood pressure seems to be controlled very well Problem #5 history of a trial fibrillation her heart rate is controlled Problem #6 history of diastolic dysfunction presently she is off and she doesn't have any significant sign of fluid overload. Problem #7 history of obesity Problem #8 history of diabetes. Problem #9 anemia. Recommendation: We'll DC Demadex because of financial reasons and put her on Lasix 100 mg by mouth once a day on when necessary basis If her patient is going to be discharged will follow her as an outpatient in 3-4 weeks.    LOS: 11 days   Kele Withem S 06/25/2011,8:23 AM

## 2011-06-25 NOTE — Progress Notes (Signed)
Patient had no complaints of pain in the IV site. IV site still has no redness, tenderness, or swelling. Patient refused to get a new one in.

## 2011-06-28 ENCOUNTER — Ambulatory Visit (INDEPENDENT_AMBULATORY_CARE_PROVIDER_SITE_OTHER): Payer: Self-pay | Admitting: *Deleted

## 2011-06-28 DIAGNOSIS — Z7901 Long term (current) use of anticoagulants: Secondary | ICD-10-CM | POA: Insufficient documentation

## 2011-06-28 DIAGNOSIS — I4891 Unspecified atrial fibrillation: Secondary | ICD-10-CM

## 2011-07-02 ENCOUNTER — Encounter: Payer: Self-pay | Admitting: Adult Health

## 2011-07-05 ENCOUNTER — Ambulatory Visit (INDEPENDENT_AMBULATORY_CARE_PROVIDER_SITE_OTHER): Payer: Self-pay | Admitting: *Deleted

## 2011-07-05 ENCOUNTER — Other Ambulatory Visit (HOSPITAL_COMMUNITY): Payer: Self-pay

## 2011-07-05 DIAGNOSIS — I4891 Unspecified atrial fibrillation: Secondary | ICD-10-CM

## 2011-07-05 DIAGNOSIS — Z7901 Long term (current) use of anticoagulants: Secondary | ICD-10-CM

## 2011-07-07 ENCOUNTER — Encounter: Payer: Self-pay | Admitting: Adult Health

## 2011-07-07 ENCOUNTER — Ambulatory Visit (INDEPENDENT_AMBULATORY_CARE_PROVIDER_SITE_OTHER): Payer: Self-pay | Admitting: *Deleted

## 2011-07-07 ENCOUNTER — Ambulatory Visit (INDEPENDENT_AMBULATORY_CARE_PROVIDER_SITE_OTHER): Payer: Self-pay | Admitting: Adult Health

## 2011-07-07 DIAGNOSIS — I4891 Unspecified atrial fibrillation: Secondary | ICD-10-CM

## 2011-07-07 DIAGNOSIS — I509 Heart failure, unspecified: Secondary | ICD-10-CM

## 2011-07-07 DIAGNOSIS — Z7901 Long term (current) use of anticoagulants: Secondary | ICD-10-CM

## 2011-07-07 DIAGNOSIS — I5033 Acute on chronic diastolic (congestive) heart failure: Secondary | ICD-10-CM

## 2011-07-07 MED ORDER — VERAPAMIL HCL ER 240 MG PO TBCR: 240.0000 mg | EXTENDED_RELEASE_TABLET | Freq: Every day | ORAL | Status: DC

## 2011-07-07 MED ORDER — FUROSEMIDE 40 MG PO TABS: 40.0000 mg | ORAL_TABLET | Freq: Every day | ORAL | Status: DC

## 2011-07-07 NOTE — Assessment & Plan Note (Signed)
HR is not well controlled at current medication dose. She only took 120 mg of verapamil today. I will increase the dose to 240 mg daily. Will check a BMET and Pro-BNP secondary to swelling. This may be related to the high intermittent doses of verapamil she is taking. Will recheck her in 2 weeks with these changes.
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
## 2011-07-07 NOTE — Patient Instructions (Signed)
**Note De-Identified Nikki Glanzer Obfuscation** Your physician has recommended you make the following change in your medication: increase Verapamil to 240 mg daily and decrease Furosemide to 40 mg daily  Your physician recommends that you schedule a follow-up appointment in: 2 weeks

## 2011-07-07 NOTE — Assessment & Plan Note (Signed)
Will have her take lasix 40 mg daily instead of prn.  She is taking up wards fo 100mg  each time she finds herself swelling. I have asked her not to do that, but to call us should she gain 2-3 lbs in one day or 5 lbs in one week. She verbalizes understanding.

## 2011-07-12 ENCOUNTER — Ambulatory Visit (INDEPENDENT_AMBULATORY_CARE_PROVIDER_SITE_OTHER): Payer: Self-pay | Admitting: *Deleted

## 2011-07-12 DIAGNOSIS — Z7901 Long term (current) use of anticoagulants: Secondary | ICD-10-CM

## 2011-07-12 DIAGNOSIS — I4891 Unspecified atrial fibrillation: Secondary | ICD-10-CM

## 2011-07-13 ENCOUNTER — Telehealth: Payer: Self-pay | Admitting: Adult Health

## 2011-07-13 NOTE — Telephone Encounter (Signed)
Per Joni Reining, NP pt. Is advised to take extra 40 mg of Furosemide today and to take 40 mg in the am and 20 mg tomorrow afternoon. Pt. also advised to call office if this is not effective so we can schedule her an appt. to be seen, she verbalized understanding./LV

## 2011-07-13 NOTE — Telephone Encounter (Signed)
Patient's husband states that patient is not getting fluid off.  Patient is having trouble walking and problems w/ SOB.  Knute Neu

## 2011-07-15 ENCOUNTER — Ambulatory Visit (INDEPENDENT_AMBULATORY_CARE_PROVIDER_SITE_OTHER): Payer: Self-pay | Admitting: *Deleted

## 2011-07-15 DIAGNOSIS — Z7901 Long term (current) use of anticoagulants: Secondary | ICD-10-CM

## 2011-07-15 DIAGNOSIS — I4891 Unspecified atrial fibrillation: Secondary | ICD-10-CM

## 2011-07-15 LAB — POCT INR: INR: 1.2

## 2011-07-15 MED ORDER — WARFARIN SODIUM 5 MG PO TABS: 5.0000 mg | ORAL_TABLET | ORAL | Status: DC

## 2011-07-19 ENCOUNTER — Ambulatory Visit (INDEPENDENT_AMBULATORY_CARE_PROVIDER_SITE_OTHER): Payer: Self-pay | Admitting: *Deleted

## 2011-07-19 DIAGNOSIS — Z7901 Long term (current) use of anticoagulants: Secondary | ICD-10-CM

## 2011-07-19 DIAGNOSIS — I4891 Unspecified atrial fibrillation: Secondary | ICD-10-CM

## 2011-07-19 LAB — POCT INR: INR: 1.6

## 2011-07-21 ENCOUNTER — Ambulatory Visit: Payer: Self-pay | Admitting: Adult Health

## 2011-07-22 ENCOUNTER — Ambulatory Visit (INDEPENDENT_AMBULATORY_CARE_PROVIDER_SITE_OTHER): Payer: Self-pay | Admitting: *Deleted

## 2011-07-22 ENCOUNTER — Encounter (HOSPITAL_COMMUNITY): Payer: Self-pay | Admitting: *Deleted

## 2011-07-22 ENCOUNTER — Encounter: Payer: Self-pay | Admitting: Adult Health

## 2011-07-22 ENCOUNTER — Ambulatory Visit (INDEPENDENT_AMBULATORY_CARE_PROVIDER_SITE_OTHER): Payer: Self-pay | Admitting: Adult Health

## 2011-07-22 ENCOUNTER — Inpatient Hospital Stay (HOSPITAL_COMMUNITY)
Admission: AD | Admit: 2011-07-22 | Discharge: 2011-07-28 | DRG: 291 | Disposition: A | Discharge status: Home / Self Care | Payer: Medicaid Other | Source: Ambulatory Visit | Attending: Internal Medicine | Admitting: Internal Medicine

## 2011-07-22 VITALS — BP 168/78 | HR 103 | Ht 67.0 in | Wt 292.0 lb

## 2011-07-22 DIAGNOSIS — R0609 Other forms of dyspnea: Secondary | ICD-10-CM | POA: Diagnosis present

## 2011-07-22 DIAGNOSIS — R0989 Other specified symptoms and signs involving the circulatory and respiratory systems: Secondary | ICD-10-CM | POA: Diagnosis present

## 2011-07-22 DIAGNOSIS — H409 Unspecified glaucoma: Secondary | ICD-10-CM | POA: Diagnosis present

## 2011-07-22 DIAGNOSIS — D509 Iron deficiency anemia, unspecified: Secondary | ICD-10-CM

## 2011-07-22 DIAGNOSIS — I5033 Acute on chronic diastolic (congestive) heart failure: Secondary | ICD-10-CM | POA: Diagnosis present

## 2011-07-22 DIAGNOSIS — I130 Hypertensive heart and chronic kidney disease with heart failure and stage 1 through stage 4 chronic kidney disease, or unspecified chronic kidney disease: Principal | ICD-10-CM | POA: Diagnosis present

## 2011-07-22 DIAGNOSIS — I110 Hypertensive heart disease with heart failure: Secondary | ICD-10-CM | POA: Diagnosis present

## 2011-07-22 DIAGNOSIS — E119 Type 2 diabetes mellitus without complications: Secondary | ICD-10-CM | POA: Diagnosis present

## 2011-07-22 DIAGNOSIS — E876 Hypokalemia: Secondary | ICD-10-CM | POA: Diagnosis not present

## 2011-07-22 DIAGNOSIS — Z7901 Long term (current) use of anticoagulants: Secondary | ICD-10-CM

## 2011-07-22 DIAGNOSIS — I509 Heart failure, unspecified: Secondary | ICD-10-CM | POA: Diagnosis present

## 2011-07-22 DIAGNOSIS — Z23 Encounter for immunization: Secondary | ICD-10-CM

## 2011-07-22 DIAGNOSIS — Z8542 Personal history of malignant neoplasm of other parts of uterus: Secondary | ICD-10-CM

## 2011-07-22 DIAGNOSIS — I4891 Unspecified atrial fibrillation: Secondary | ICD-10-CM | POA: Diagnosis present

## 2011-07-22 DIAGNOSIS — N183 Chronic kidney disease, stage 3 unspecified: Secondary | ICD-10-CM | POA: Diagnosis present

## 2011-07-22 DIAGNOSIS — Z79899 Other long term (current) drug therapy: Secondary | ICD-10-CM

## 2011-07-22 DIAGNOSIS — Z6841 Body Mass Index (BMI) 40.0 and over, adult: Secondary | ICD-10-CM

## 2011-07-22 DIAGNOSIS — I10 Essential (primary) hypertension: Secondary | ICD-10-CM | POA: Diagnosis present

## 2011-07-22 DIAGNOSIS — M129 Arthropathy, unspecified: Secondary | ICD-10-CM | POA: Diagnosis present

## 2011-07-22 DIAGNOSIS — E669 Obesity, unspecified: Secondary | ICD-10-CM | POA: Diagnosis present

## 2011-07-22 LAB — POCT INR: INR: 2

## 2011-07-22 LAB — COMPREHENSIVE METABOLIC PANEL
ALT: 9 U/L (ref 0–35)
Alkaline Phosphatase: 124 U/L — ABNORMAL HIGH (ref 39–117)
CO2: 26 mEq/L (ref 19–32)
Calcium: 9.5 mg/dL (ref 8.4–10.5)
Chloride: 102 mEq/L (ref 96–112)
GFR calc Af Amer: 55 mL/min — ABNORMAL LOW (ref >90)
GFR calc non Af Amer: 48 mL/min — ABNORMAL LOW (ref >90)
Glucose, Bld: 128 mg/dL — ABNORMAL HIGH (ref 70–99)
Potassium: 3.6 mEq/L (ref 3.5–5.1)
Sodium: 141 mEq/L (ref 135–145)
Total Bilirubin: 1.5 mg/dL — ABNORMAL HIGH (ref 0.3–1.2)

## 2011-07-22 LAB — CBC
MCH: 27.9 pg (ref 26.0–34.0)
MCHC: 30.8 g/dL (ref 30.0–36.0)
MCV: 90.4 fL (ref 78.0–100.0)
Platelets: 186 10*3/uL (ref 150–400)
RDW: 16.9 % — ABNORMAL HIGH (ref 11.5–15.5)

## 2011-07-22 LAB — GLUCOSE, CAPILLARY

## 2011-07-22 MED ORDER — WARFARIN SODIUM 5 MG PO TABS
5.0000 mg | ORAL_TABLET | Freq: Once | ORAL | Status: AC
  Administered 2011-07-22: 5 mg via ORAL
  Filled 2011-07-22: qty 1

## 2011-07-22 MED ORDER — VERAPAMIL HCL ER 240 MG PO TBCR
240.0000 mg | EXTENDED_RELEASE_TABLET | Freq: Every day | ORAL | Status: DC
  Administered 2011-07-22 – 2011-07-24 (×3): 240 mg via ORAL
  Filled 2011-07-22 (×3): qty 1

## 2011-07-22 MED ORDER — SODIUM CHLORIDE 0.9 % IJ SOLN
3.0000 mL | Freq: Two times a day (BID) | INTRAMUSCULAR | Status: DC
  Administered 2011-07-22 – 2011-07-26 (×7): 3 mL via INTRAVENOUS
  Filled 2011-07-22 (×7): qty 3
  Filled 2011-07-22: qty 6

## 2011-07-22 MED ORDER — HYDROCODONE-HOMATROPINE 5-1.5 MG/5ML PO SYRP
5.0000 mL | ORAL_SOLUTION | Freq: Four times a day (QID) | ORAL | Status: DC | PRN
  Administered 2011-07-22 – 2011-07-27 (×6): 5 mL via ORAL
  Filled 2011-07-22 (×6): qty 5

## 2011-07-22 MED ORDER — WARFARIN SODIUM 5 MG PO TABS: 5.0000 mg | ORAL_TABLET | ORAL | Status: DC

## 2011-07-22 MED ORDER — ALLOPURINOL 100 MG PO TABS
100.0000 mg | ORAL_TABLET | Freq: Every day | ORAL | Status: DC
  Administered 2011-07-22 – 2011-07-28 (×7): 100 mg via ORAL
  Filled 2011-07-22 (×7): qty 1

## 2011-07-22 MED ORDER — ACETAMINOPHEN 500 MG PO TABS: 500.0000 mg | ORAL_TABLET | ORAL | Status: DC | PRN

## 2011-07-22 MED ORDER — INSULIN ASPART 100 UNIT/ML ~~LOC~~ SOLN: 0.0000 [IU] | Freq: Every day | SUBCUTANEOUS | Status: DC

## 2011-07-22 MED ORDER — SIMVASTATIN 20 MG PO TABS
40.0000 mg | ORAL_TABLET | Freq: Every day | ORAL | Status: DC
  Administered 2011-07-22: 40 mg via ORAL
  Filled 2011-07-22: qty 2

## 2011-07-22 MED ORDER — PNEUMOCOCCAL VAC POLYVALENT 25 MCG/0.5ML IJ INJ
0.5000 mL | INJECTION | INTRAMUSCULAR | Status: AC
  Administered 2011-07-23: 0.5 mL via INTRAMUSCULAR
  Filled 2011-07-22: qty 0.5

## 2011-07-22 MED ORDER — FUROSEMIDE 10 MG/ML IJ SOLN
80.0000 mg | Freq: Two times a day (BID) | INTRAMUSCULAR | Status: DC
  Administered 2011-07-22 – 2011-07-23 (×2): 80 mg via INTRAVENOUS
  Filled 2011-07-22 (×2): qty 8

## 2011-07-22 MED ORDER — INSULIN ASPART 100 UNIT/ML ~~LOC~~ SOLN
0.0000 [IU] | Freq: Three times a day (TID) | SUBCUTANEOUS | Status: DC
  Administered 2011-07-22: 3 [IU] via SUBCUTANEOUS
  Administered 2011-07-24: 7 [IU] via SUBCUTANEOUS
  Administered 2011-07-24 (×2): 3 [IU] via SUBCUTANEOUS
  Administered 2011-07-25: 4 [IU] via SUBCUTANEOUS
  Filled 2011-07-22: qty 3

## 2011-07-22 MED ORDER — POLYSACCHARIDE IRON 150 MG PO CAPS
150.0000 mg | ORAL_CAPSULE | Freq: Every day | ORAL | Status: DC
  Administered 2011-07-22 – 2011-07-28 (×7): 150 mg via ORAL
  Filled 2011-07-22 (×7): qty 1

## 2011-07-22 MED ORDER — GLIPIZIDE ER 5 MG PO TB24
5.0000 mg | ORAL_TABLET | ORAL | Status: DC
  Administered 2011-07-23 – 2011-07-24 (×2): 5 mg via ORAL
  Filled 2011-07-22: qty 1

## 2011-07-22 NOTE — Assessment & Plan Note (Signed)
She above consult note. Admit
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
## 2011-07-22 NOTE — Progress Notes (Signed)
Patient seen and examined with Ms. Lawrence today. She is followed by Dr. Dietrich Pates. Her history includes chronic diastolic heart failure, recently reporting problems with progressive weight gain, peripheral edema, and increasing abdominal girth. I reviewed the history including concerns about corrected usage at home.  On examination she is hypertensive at 168/78, heart rate in the 100-120 range in atrial fibrillation, weight is up 5 pounds from last visit. Lungs exhibit diminished breath sounds at the bases, cardiac exam with rapid irregularly irregular rhythm, no S3, evidence of ascites with distal pitting edema and protuberant abdomen.  Situation discussed with the patient and her husband. We have recommended admission to the hospital on telemetry, plan for intravenous diuresis, and further adjustment of blood pressure and heart rate control medications. Further education also needed regarding regular and adequate use of her medications at home.  Jonelle Sidle, M.D., F.A.C.C.

## 2011-07-22 NOTE — Assessment & Plan Note (Signed)
She above consult note. Admit 

## 2011-07-22 NOTE — Patient Instructions (Signed)
Report called to [place], Charity fundraiser.  Patient taken to Admitting for direct admit to room 312
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
## 2011-07-23 ENCOUNTER — Inpatient Hospital Stay (HOSPITAL_COMMUNITY): Payer: Medicaid Other

## 2011-07-23 DIAGNOSIS — I5033 Acute on chronic diastolic (congestive) heart failure: Secondary | ICD-10-CM

## 2011-07-23 DIAGNOSIS — I4891 Unspecified atrial fibrillation: Secondary | ICD-10-CM

## 2011-07-23 DIAGNOSIS — D509 Iron deficiency anemia, unspecified: Secondary | ICD-10-CM

## 2011-07-23 DIAGNOSIS — I509 Heart failure, unspecified: Secondary | ICD-10-CM

## 2011-07-23 LAB — GLUCOSE, CAPILLARY
Glucose-Capillary: 105 mg/dL — ABNORMAL HIGH (ref 70–99)
Glucose-Capillary: 150 mg/dL — ABNORMAL HIGH (ref 70–99)
Glucose-Capillary: 157 mg/dL — ABNORMAL HIGH (ref 70–99)

## 2011-07-23 LAB — PROTIME-INR: Prothrombin Time: 22.1 seconds — ABNORMAL HIGH (ref 11.6–15.2)

## 2011-07-23 LAB — PRO B NATRIURETIC PEPTIDE: Pro B Natriuretic peptide (BNP): 1392 pg/mL — ABNORMAL HIGH (ref 0–125)

## 2011-07-23 LAB — TSH: TSH: 1.553 u[IU]/mL (ref 0.350–4.500)

## 2011-07-23 LAB — BASIC METABOLIC PANEL
CO2: 25 mEq/L (ref 19–32)
Calcium: 9.2 mg/dL (ref 8.4–10.5)
Chloride: 102 mEq/L (ref 96–112)
Creatinine, Ser: 1.2 mg/dL — ABNORMAL HIGH (ref 0.50–1.10)
Glucose, Bld: 157 mg/dL — ABNORMAL HIGH (ref 70–99)

## 2011-07-23 LAB — RETICULOCYTES
RBC.: 3.24 MIL/uL — ABNORMAL LOW (ref 3.87–5.11)
Retic Count, Absolute: 119.9 10*3/uL (ref 19.0–186.0)

## 2011-07-23 LAB — VITAMIN B12: Vitamin B-12: 688 pg/mL (ref 211–911)

## 2011-07-23 LAB — IRON AND TIBC: Iron: 35 ug/dL — ABNORMAL LOW (ref 42–135)

## 2011-07-23 MED ORDER — METOPROLOL TARTRATE 25 MG PO TABS
25.0000 mg | ORAL_TABLET | Freq: Two times a day (BID) | ORAL | Status: DC
  Administered 2011-07-23 – 2011-07-28 (×10): 25 mg via ORAL
  Filled 2011-07-23 (×10): qty 1

## 2011-07-23 MED ORDER — ATORVASTATIN CALCIUM 10 MG PO TABS
20.0000 mg | ORAL_TABLET | Freq: Every day | ORAL | Status: DC
  Administered 2011-07-23 – 2011-07-27 (×5): 20 mg via ORAL
  Filled 2011-07-23 (×7): qty 2

## 2011-07-23 MED ORDER — SODIUM CHLORIDE 0.9 % IJ SOLN
INTRAMUSCULAR | Status: AC
  Administered 2011-07-23: 15:00:00
  Filled 2011-07-23: qty 3

## 2011-07-23 MED ORDER — BISACODYL 5 MG PO TBEC
10.0000 mg | DELAYED_RELEASE_TABLET | Freq: Two times a day (BID) | ORAL | Status: DC | PRN
  Administered 2011-07-23 – 2011-07-27 (×2): 10 mg via ORAL
  Filled 2011-07-23 (×2): qty 2

## 2011-07-23 MED ORDER — WARFARIN SODIUM 7.5 MG PO TABS
7.5000 mg | ORAL_TABLET | Freq: Once | ORAL | Status: AC
  Administered 2011-07-23: 7.5 mg via ORAL
  Filled 2011-07-23: qty 1

## 2011-07-23 MED ORDER — FUROSEMIDE 10 MG/ML IJ SOLN
80.0000 mg | Freq: Four times a day (QID) | INTRAMUSCULAR | Status: DC
  Administered 2011-07-23 – 2011-07-24 (×5): 80 mg via INTRAVENOUS
  Filled 2011-07-23 (×5): qty 8

## 2011-07-23 NOTE — Progress Notes (Addendum)
SUBJECTIVE:Feeling better, less short of breath.  Active Problems:  HYPERTENSIVE CARDIOVASCULAR DISEASE, WITH CHF  Atrial fibrillation  HTN (hypertension)  Diastolic CHF, acute on chronic AODM Anemia  LABS: Basic Metabolic Panel:  Basename 07/23/11 0514 07/22/11 1702  NA 142 141  K 3.7 3.6  CL 102 102  CO2 25 26  GLUCOSE 157* 128*  BUN 18 17  CREATININE 1.20* 1.18*  CALCIUM 9.2 9.5  MG -- 2.1  PHOS -- --   Liver Function Tests:  Basename 07/22/11 1702  AST 24  ALT 9  ALKPHOS 124*  BILITOT 1.5*  PROT 7.3  ALBUMIN 3.8   No results found for this basename: LIPASE:2,AMYLASE:2 in the last 72 hours CBC:  Basename 07/22/11 1702  WBC 6.9  NEUTROABS --  HGB 9.0*  HCT 29.2*  MCV 90.4  PLT 186   Hemoglobin A1C:  Basename 07/22/11 1702  HGBA1C 6.6*   Thyroid Function Tests:  Basename 07/22/11 1702  TSH 1.553  T4TOTAL --  T3FREE --  THYROIDAB --   EXAM BP 153/65  Pulse 97  Temp(Src) 98.1 F (36.7 C) (Oral)  Resp 20  Ht 5\' 7"  (1.702 m)  Wt 290 lb 12.6 oz (131.9 kg)  BMI 45.54 kg/m2  SpO2 96% General: Well developed, well nourished, in no acute distress Head: Eyes PERRLA, No xanthomas.   Normal cephalic and atramatic  Lungs: Clear bilaterally to auscultation and percussion. Heart: HRRR S1 S2, Grade 1/6 holosystolic murmur at apex .  Pulses are 2+ & equal.  No carotid bruit. moderate JVD.  No abdominal bruits. No femoral bruits. Abdomen: Bowel sounds are positive, abdomen soft and non-tender without masses or                  Hernia's noted. Msk:  Back normal, normal gait. Normal strength and tone for age. Extremities: No clubbing, cyanosis   2+edema.  DP +1 Neuro: Alert and oriented X 3. Psych:  Good affect, responds appropriately  TELEMETRY: Reviewed telemetry pt in Atrial Fib heart rate in the 90's.  ASSESSMENT AND PLAN:  1. Acute on chronic diastolic CHF: She is beginning to diuresis with IV Lasix. She has not lost any weight however, her daily  weights. She states she is breathing better, however, the lower extent of the edema is not significantly better. She still has pitting edema up above her knees. We will continue to diuresis. There is still an element of medical noncompliance, as she has been manipulating her meds at home. Kidney function remains stable with a creatinine of 1.18 and 1.20.She has moderate anemia with a hemoglobin of 9.0, which could be contributing and has been gradually progressive over the past few years. She is on a fluid restriction of 1200 cc a day.  2. Anemia. Review of hemoglobin and hematocrit reveals hemoglobin of 13.4 in November 2011 that has been averaging between 9.7 and 10.1 since that time.Anemia profile completed on 06/18/2011 suggested iron deficiency, but oral therapy has not been effective.  Suggest course of IV iron while she is here.   3. Atrial fibrillation. On admission. She was in AF with RVR on admission but rate is now better controlled.  We will continue to watch her blood pressure and HR control on this medication. She will continue on Coumadin as directed. Hemoccult stools.  Bettey Mare. Lyman Bishop NP [facility] 07/23/2011, 10:05 AM   Cardiology Attending Patient interviewed and examined. Discussed with Joni Reining, NP.  Above note annotated and modified based  upon my findings.  Diuresis of 2 L without any decrease in weight.  Pt feels better with improved pedal edema.  Appears that she requires at least 5-10 additional liters of diuresis.  Liberalize fluid restriction-increase diuretic as needed.  Consider course of IV iron.  Add metoprolol for better rate control.  INR has been low theraputic since admit.  Weinert Bing, MD 07/23/2011, 7:48 PM
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
## 2011-07-23 NOTE — Progress Notes (Signed)
Subjective: Although this lady has not lost any weight whatsoever, she does feel better. She has had a reasonably good diuresis of -1400. She feels less short of breath.           Physical Exam: Blood pressure 153/65, pulse 97, temperature 98.1 F (36.7 C), temperature source Oral, resp. rate 20, height 5\' 7"  (1.702 m), weight 131.9 kg (290 lb 12.6 oz), SpO2 96.00%. She does appear to look somewhat better. Heart sounds are present and tachycardic as before in an irregular fashion. Lung fields are entirely clear. She continues to have pitting edema of her legs.   Investigations:     Basic Metabolic Panel:  Basename 07/23/11 0514 07/22/11 1702  NA 142 141  K 3.7 3.6  CL 102 102  CO2 25 26  GLUCOSE 157* 128*  BUN 18 17  CREATININE 1.20* 1.18*  CALCIUM 9.2 9.5  MG -- 2.1  PHOS -- --   Liver Function Tests:  Basename 07/22/11 1702  AST 24  ALT 9  ALKPHOS 124*  BILITOT 1.5*  PROT 7.3  ALBUMIN 3.8     CBC:  Basename 07/22/11 1702  WBC 6.9  NEUTROABS --  HGB 9.0*  HCT 29.2*  MCV 90.4  PLT 186        Medications: I have reviewed the patient's current medications.  Impression: 1. Diastolic congestive heart failure. 2. Chronic atrial fibrillation. 3. Hypertension. 4. Chronic kidney disease.     Plan: 1. Increase IV Lasix to 80 mg every 6 hours. I think we need to lose at least 10 pounds on this admission. 2. Monitor renal function closely. I suspect that she will actually need to have a higher creatinine to achieve better control of her heart failure.     LOS: 1 day   Wilson Singer Pager [phone]  07/23/2011, 8:15 AM

## 2011-07-23 NOTE — Progress Notes (Signed)
Patient in room , feet elevated in recliner continues on IV Lasix

## 2011-07-24 LAB — BASIC METABOLIC PANEL
CO2: 26 mEq/L (ref 19–32)
Chloride: 102 mEq/L (ref 96–112)
Creatinine, Ser: 1.24 mg/dL — ABNORMAL HIGH (ref 0.50–1.10)
GFR calc Af Amer: 52 mL/min — ABNORMAL LOW (ref >90)
Potassium: 3.1 mEq/L — ABNORMAL LOW (ref 3.5–5.1)
Sodium: 139 mEq/L (ref 135–145)

## 2011-07-24 LAB — GLUCOSE, CAPILLARY
Glucose-Capillary: 133 mg/dL — ABNORMAL HIGH (ref 70–99)
Glucose-Capillary: 211 mg/dL — ABNORMAL HIGH (ref 70–99)

## 2011-07-24 LAB — PROTIME-INR
INR: 1.95 — ABNORMAL HIGH (ref 0.00–1.49)
Prothrombin Time: 22.6 seconds — ABNORMAL HIGH (ref 11.6–15.2)

## 2011-07-24 MED ORDER — FUROSEMIDE 80 MG PO TABS
80.0000 mg | ORAL_TABLET | Freq: Three times a day (TID) | ORAL | Status: DC
  Administered 2011-07-24 – 2011-07-25 (×2): 80 mg via ORAL
  Filled 2011-07-24 (×2): qty 1

## 2011-07-24 MED ORDER — ONDANSETRON HCL 4 MG/2ML IJ SOLN
4.0000 mg | Freq: Four times a day (QID) | INTRAMUSCULAR | Status: DC | PRN
  Administered 2011-07-25: 4 mg via INTRAVENOUS
  Filled 2011-07-24 (×3): qty 2

## 2011-07-24 MED ORDER — VERAPAMIL HCL ER 180 MG PO TBCR
180.0000 mg | EXTENDED_RELEASE_TABLET | Freq: Every day | ORAL | Status: DC
  Administered 2011-07-25 – 2011-07-28 (×4): 180 mg via ORAL
  Filled 2011-07-24 (×4): qty 1

## 2011-07-24 MED ORDER — WARFARIN SODIUM 7.5 MG PO TABS
7.5000 mg | ORAL_TABLET | Freq: Once | ORAL | Status: AC
  Administered 2011-07-24: 7.5 mg via ORAL
  Filled 2011-07-24: qty 1

## 2011-07-24 MED ORDER — POTASSIUM CHLORIDE CRYS ER 20 MEQ PO TBCR
40.0000 meq | EXTENDED_RELEASE_TABLET | Freq: Every day | ORAL | Status: DC
  Administered 2011-07-24: 40 meq via ORAL
  Filled 2011-07-24: qty 2

## 2011-07-24 MED ORDER — FUROSEMIDE 10 MG/ML IJ SOLN: 80.0000 mg | Freq: Three times a day (TID) | INTRAMUSCULAR | Status: DC
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
## 2011-07-24 NOTE — Progress Notes (Signed)
Subjective: This lady continues to feel better even compared to yesterday. She has not really lost any significant weight, only 1 pound. She is anemic and her iron levels and saturations are low indicative of iron deficiency. There is no evidence/history of GI bleeding. She says she had a colonoscopy more than 10 years ago.           Physical Exam: Blood pressure 125/72, pulse 80, temperature 97.8 F (36.6 C), temperature source Axillary, resp. rate 18, height 5\' 7"  (1.702 m), weight 131.2 kg (289 lb 3.9 oz), SpO2 97.00%. She does appear to look somewhat better. Heart sounds are present and tachycardic as before in an irregular fashion. Lung fields are entirely clear. She continues to have pitting edema of her legs, possibly less.   Investigations:     Basic Metabolic Panel:  Basename 07/24/11 0500 07/23/11 0514 07/22/11 1702  NA 139 142 --  K 3.1* 3.7 --  CL 102 102 --  CO2 26 25 --  GLUCOSE 166* 157* --  BUN 19 18 --  CREATININE 1.24* 1.20* --  CALCIUM 9.4 9.2 --  MG -- -- 2.1  PHOS -- -- --   Liver Function Tests:  Basename 07/22/11 1702  AST 24  ALT 9  ALKPHOS 124*  BILITOT 1.5*  PROT 7.3  ALBUMIN 3.8     CBC:  Basename 07/22/11 1702  WBC 6.9  NEUTROABS --  HGB 9.0*  HCT 29.2*  MCV 90.4  PLT 186        Medications: I have reviewed the patient's current medications.  Impression: 1. Diastolic congestive heart failure, slow improvement. 2. Chronic atrial fibrillation. 3. Hypertension. 4. Chronic kidney disease. 5. Hypokalemia.     Plan: 1. Continue with current dose of intravenous Lasix. 2. Replete potassium. 3. Watch her weights closely and we may need to further increase her Lasix tomorrow.    LOS: 2 days   Wilson Singer Pager [phone]  07/24/2011, 8:18 AM

## 2011-07-24 NOTE — Plan of Care (Signed)
Problem: Phase I Progression Outcomes Goal: EF % per last Echo/documented,Core Reminder form on chart Outcome: Completed/Met Date Met:  07/24/11 EF 60% per last echo

## 2011-07-25 ENCOUNTER — Inpatient Hospital Stay (HOSPITAL_COMMUNITY): Payer: Medicaid Other

## 2011-07-25 ENCOUNTER — Encounter (HOSPITAL_COMMUNITY): Payer: Self-pay

## 2011-07-25 LAB — GLUCOSE, CAPILLARY: Glucose-Capillary: 116 mg/dL — ABNORMAL HIGH (ref 70–99)

## 2011-07-25 LAB — BASIC METABOLIC PANEL
BUN: 25 mg/dL — ABNORMAL HIGH (ref 6–23)
CO2: 26 mEq/L (ref 19–32)
Calcium: 9.3 mg/dL (ref 8.4–10.5)
GFR calc non Af Amer: 32 mL/min — ABNORMAL LOW (ref >90)
Glucose, Bld: 117 mg/dL — ABNORMAL HIGH (ref 70–99)
Sodium: 138 mEq/L (ref 135–145)

## 2011-07-25 MED ORDER — WARFARIN SODIUM 7.5 MG PO TABS
7.5000 mg | ORAL_TABLET | Freq: Once | ORAL | Status: AC
  Administered 2011-07-25: 7.5 mg via ORAL
  Filled 2011-07-25: qty 1

## 2011-07-25 MED ORDER — POTASSIUM CHLORIDE CRYS ER 20 MEQ PO TBCR
40.0000 meq | EXTENDED_RELEASE_TABLET | Freq: Two times a day (BID) | ORAL | Status: DC
  Administered 2011-07-25 – 2011-07-28 (×6): 40 meq via ORAL
  Filled 2011-07-25 (×2): qty 2
  Filled 2011-07-25: qty 1
  Filled 2011-07-25 (×6): qty 2

## 2011-07-25 MED ORDER — SODIUM CHLORIDE 0.9 % IJ SOLN
10.0000 mL | Freq: Two times a day (BID) | INTRAMUSCULAR | Status: DC
  Administered 2011-07-25: 13 mL
  Administered 2011-07-25 – 2011-07-28 (×6): 10 mL
  Filled 2011-07-25 (×4): qty 3

## 2011-07-25 MED ORDER — GLIPIZIDE ER 5 MG PO TB24
5.0000 mg | ORAL_TABLET | ORAL | Status: DC
  Administered 2011-07-25 – 2011-07-28 (×4): 5 mg via ORAL
  Filled 2011-07-25 (×4): qty 1

## 2011-07-25 MED ORDER — WARFARIN VIDEO
Freq: Once | Status: AC
  Administered 2011-07-25: 12:00:00

## 2011-07-25 MED ORDER — FUROSEMIDE 10 MG/ML IJ SOLN
80.0000 mg | Freq: Three times a day (TID) | INTRAMUSCULAR | Status: DC
  Administered 2011-07-25 (×3): 80 mg via INTRAVENOUS
  Filled 2011-07-25 (×3): qty 8

## 2011-07-25 MED ORDER — SODIUM CHLORIDE 0.9 % IJ SOLN
10.0000 mL | INTRAMUSCULAR | Status: DC | PRN
  Filled 2011-07-25: qty 6
  Filled 2011-07-25: qty 3

## 2011-07-25 MED ORDER — METOLAZONE 5 MG PO TABS
2.5000 mg | ORAL_TABLET | Freq: Once | ORAL | Status: AC
  Administered 2011-07-25: 2.5 mg via ORAL
  Filled 2011-07-25: qty 1
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
## 2011-07-25 NOTE — Consult Note (Signed)
Education  Consult received. Spoke with pt by telephone. RD will provide Renal Diet Education Monday morning.    Dietitian-#[phone]

## 2011-07-25 NOTE — Progress Notes (Signed)
Subjective: This lady has no IV access now. Hopefully, we can get a PICC line in her today. In the meantime, she is taking oral diuretics. She has lost approximately only 2 pounds since admission despite a reasonably good diuresis.           Physical Exam: Blood pressure 143/83, pulse 82, temperature 98.3 F (36.8 C), temperature source Oral, resp. rate 18, height 5\' 7"  (1.702 m), weight 130.7 kg (288 lb 2.3 oz), SpO2 90.00%. She does appear to look somewhat better. Heart sounds are present and tachycardic as before in an irregular fashion. Lung fields are entirely clear. She continues to have pitting edema of her legs, definitely less.   Investigations:     Basic Metabolic Panel:  Basename 07/25/11 0635 07/24/11 0500 07/22/11 1702  NA 138 139 --  K 3.1* 3.1* --  CL 100 102 --  CO2 26 26 --  GLUCOSE 117* 166* --  BUN 25* 19 --  CREATININE 1.65* 1.24* --  CALCIUM 9.3 9.4 --  MG -- -- 2.1  PHOS -- -- --   Liver Function Tests:  Basename 07/22/11 1702  AST 24  ALT 9  ALKPHOS 124*  BILITOT 1.5*  PROT 7.3  ALBUMIN 3.8     CBC:  Basename 07/22/11 1702  WBC 6.9  NEUTROABS --  HGB 9.0*  HCT 29.2*  MCV 90.4  PLT 186        Medications: I have reviewed the patient's current medications.  Impression: 1. Diastolic congestive heart failure, slow improvement. 2. Chronic atrial fibrillation. 3. Hypertension. 4. Chronic kidney disease. 5. Hypokalemia.     Plan: 1. Continue with current therapy 2. Replete potassium. 3. Watch her weights closely and we may need to further add medication such as metolazone today.    LOS: 3 days   Wilson Singer Pager [phone]  07/25/2011, 7:45 AM

## 2011-07-26 ENCOUNTER — Encounter: Payer: Self-pay | Admitting: *Deleted

## 2011-07-26 LAB — COMPREHENSIVE METABOLIC PANEL
AST: 23 U/L (ref 0–37)
Albumin: 3.6 g/dL (ref 3.5–5.2)
Alkaline Phosphatase: 106 U/L (ref 39–117)
Chloride: 100 mEq/L (ref 96–112)
Creatinine, Ser: 1.9 mg/dL — ABNORMAL HIGH (ref 0.50–1.10)
Potassium: 3.3 mEq/L — ABNORMAL LOW (ref 3.5–5.1)
Sodium: 142 mEq/L (ref 135–145)
Total Bilirubin: 0.9 mg/dL (ref 0.3–1.2)

## 2011-07-26 LAB — PROTIME-INR
INR: 2.28 — ABNORMAL HIGH (ref 0.00–1.49)
Prothrombin Time: 25.5 seconds — ABNORMAL HIGH (ref 11.6–15.2)

## 2011-07-26 LAB — GLUCOSE, CAPILLARY
Glucose-Capillary: 110 mg/dL — ABNORMAL HIGH (ref 70–99)
Glucose-Capillary: 112 mg/dL — ABNORMAL HIGH (ref 70–99)
Glucose-Capillary: 142 mg/dL — ABNORMAL HIGH (ref 70–99)

## 2011-07-26 MED ORDER — FUROSEMIDE 80 MG PO TABS
80.0000 mg | ORAL_TABLET | Freq: Every day | ORAL | Status: DC
  Administered 2011-07-26: 80 mg via ORAL
  Filled 2011-07-26: qty 1

## 2011-07-26 MED ORDER — FUROSEMIDE 40 MG PO TABS
40.0000 mg | ORAL_TABLET | Freq: Two times a day (BID) | ORAL | Status: DC
  Administered 2011-07-26 – 2011-07-27 (×2): 40 mg via ORAL
  Filled 2011-07-26 (×2): qty 1

## 2011-07-26 MED ORDER — WARFARIN SODIUM 7.5 MG PO TABS
7.5000 mg | ORAL_TABLET | Freq: Once | ORAL | Status: AC
  Administered 2011-07-26: 7.5 mg via ORAL
  Filled 2011-07-26: qty 1

## 2011-07-26 MED ORDER — TORSEMIDE 20 MG PO TABS: 20.0000 mg | ORAL_TABLET | Freq: Two times a day (BID) | ORAL | Status: DC

## 2011-07-26 MED ORDER — METOLAZONE 5 MG PO TABS
2.5000 mg | ORAL_TABLET | Freq: Every day | ORAL | Status: DC
  Administered 2011-07-27 – 2011-07-28 (×2): 2.5 mg via ORAL
  Filled 2011-07-26 (×2): qty 1

## 2011-07-26 MED ORDER — POTASSIUM CHLORIDE CRYS ER 20 MEQ PO TBCR
40.0000 meq | EXTENDED_RELEASE_TABLET | Freq: Once | ORAL | Status: AC
  Administered 2011-07-26: 40 meq via ORAL

## 2011-07-26 MED ORDER — FUROSEMIDE 10 MG/ML IJ SOLN: 80.0000 mg | Freq: Two times a day (BID) | INTRAMUSCULAR | Status: DC

## 2011-07-26 MED ORDER — METOLAZONE 5 MG PO TABS
5.0000 mg | ORAL_TABLET | Freq: Every day | ORAL | Status: DC
  Administered 2011-07-26: 5 mg via ORAL
  Filled 2011-07-26: qty 1

## 2011-07-26 NOTE — Progress Notes (Signed)
Pt ambulated with assistance from husband in hallway approximately 100 ft. Tolerated well.
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
## 2011-07-26 NOTE — Progress Notes (Signed)
Pt ambulated in hallway with assistance from husband this afternoon. Pt ambulated approximately 200 ft. Tolerated well.

## 2011-07-26 NOTE — Progress Notes (Signed)
CARE MANAGEMENT NOTE 07/26/2011  Patient:  Krystal [name], Krystal [name]   Account Number:  [id number]  Date Initiated:  07/26/2011  Documentation initiated by:  Rosemary Holms  Subjective/Objective Assessment:   Pt admitted with heart faliar and Atrial Fib. PTA lived at home with spouse     Action/Plan:   No HH needs anticipated   Anticipated DC Date:  07/28/2011   Anticipated DC Plan:        DC Planning Services  CM consult      Choice offered to / List presented to:             Status of service:  In process, will continue to follow Medicare Important Message given?   (If response is "NO", the following Medicare IM given date fields will be blank) Date Medicare IM given:   Date Additional Medicare IM given:    Discharge Disposition:    Per UR Regulation:    Comments:  07/26/11 1430 Krystal Hoppel Leanord Hawking RN BSN CM
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
## 2011-07-26 NOTE — Plan of Care (Signed)
Problem: Food- and Nutrition-Related Knowledge Deficit (NB-1.1) Goal: Nutrition education Formal process to instruct or train a patient/client in a skill or to impart knowledge to help patients/clients voluntarily manage or modify food choices and eating behavior to maintain or improve health.  Outcome: Completed/Met Date Met:  07/26/11 Knowledge deficit resolved with diet education  Comments:  Discussed and provided handout for kidney diet. Discussed fluid restriction and sodium, phosphorous and potassium recommendations. Patient currently has low Potassium, recommended choose foods from high potassium list while potassium is low. Patient asked good questions, has no further diet related questions at this time. Patient verbalized understanding.   Expect good compliance. RD available for any further diet related questions.  Iven Finn, MS, RD, LDN Pager [phone]

## 2011-07-26 NOTE — Progress Notes (Signed)
Addended by: Reather Laurence A on: 07/26/2011 02:01 PM   Modules accepted: Orders

## 2011-07-26 NOTE — Progress Notes (Signed)
Subjective: This lady was able to get a PICC line yesterday. She was restarted on her intravenous antibiotics after this and I did give her one dose of metolazone 2.5 mg. This produced a significant diuresis and now she is finally losing weight. Altogether, she has lost approximately 8 pounds since admission. She still feels there is a little ways to go before she feels better but she is clearly improved.          Physical Exam: Blood pressure 125/71, pulse 74, temperature 98.1 F (36.7 C), temperature source Oral, resp. rate 20, height 5\' 7"  (1.702 m), weight 128.3 kg (282 lb 13.6 oz), SpO2 94.00%. She does appear  better. Heart sounds are present and tachycardic as before in an irregular fashion. Lung fields are entirely clear. She continues to have pitting edema of her legs, definitely less.   Investigations:     Basic Metabolic Panel:  Basename 07/26/11 0505 07/25/11 0635  NA 142 138  K 3.3* 3.1*  CL 100 100  CO2 31 26  GLUCOSE 116* 117*  BUN 30* 25*  CREATININE 1.90* 1.65*  CALCIUM 9.8 9.3  MG -- --  PHOS -- --   Liver Function Tests:  Basename 07/26/11 0505  AST 23  ALT 8  ALKPHOS 106  BILITOT 0.9  PROT 7.1  ALBUMIN 3.6            Medications: I have reviewed the patient's current medications.  Impression: 1. Diastolic congestive heart failure, clearly improving within a pound weight loss now. 2. Chronic atrial fibrillation. 3. Hypertension. 4. Chronic kidney disease, worsening creatinine. 5. Hypokalemia.      Plan: 1. Reduce intravenous Lasix to 80 mg twice a day. 2. Replete potassium. 3. Anticipate this lady can probably be discharged in the next 1-2 days if she continues to diuresis well and lose weight.    LOS: 4 days   Wilson Singer Pager [phone]  07/26/2011, 8:49 AM

## 2011-07-26 NOTE — Progress Notes (Addendum)
SUBJECTIVE: Feeling and breathing a lot better, happy that edema is better as well.  Active Problems:  Diastolic CHF, acute on chronic  Atrial fibrillation  HYPERTENSIVE CARDIOVASCULAR DISEASE, WITH CHF  HTN (hypertension)   LABS: Basic Metabolic Panel:  Basename 07/26/11 0505 07/25/11 0635  NA 142 138  K 3.3* 3.1*  CL 100 100  CO2 31 26  GLUCOSE 116* 117*  BUN 30* 25*  CREATININE 1.90* 1.65*  CALCIUM 9.8 9.3  MG -- --  PHOS -- --   Liver Function Tests:  Basename 07/26/11 0505  AST 23  ALT 8  ALKPHOS 106  BILITOT 0.9  PROT 7.1  ALBUMIN 3.6    RADIOLOGY: Chest Portable 1 View Post Insertion To Confirm Placement As Interpreted By Radiologist  07/25/2011  *RADIOLOGY REPORT*  Clinical Data: PICC line placement  PORTABLE CHEST - 1 VIEW  Comparison: 07/23/2011  Findings: Cardiomegaly again noted.  Probable trace right pleural effusion with right basilar atelectasis.  There is a right arm PICC line with tip in SVC right atrium junction.  No diagnostic pneumothorax.  IMPRESSION: Right arm PICC line with tip in SVC right atrium junction.  No diagnostic pneumothorax.  Cardiomegaly again noted.  Original Report Authenticated By: Natasha Mead, M.D.     PHYSICAL EXAM BP 125/71  Pulse 74  Temp(Src) 98.1 F (36.7 C) (Oral)  Resp 20  Ht 5\' 7"  (1.702 m)  Wt 282 lb 13.6 oz (128.3 kg)  BMI 44.30 kg/m2  SpO2 94% General: Well developed, well nourished, in no acute distress, obese Head: Eyes PERRLA, No xanthomas.   Normal cephalic and atramatic  Lungs: Clear bilaterally to auscultation and percussion. Heart: HRRR S1 S2, No MRG .  Pulses are 2+ & equal.            No carotid bruit. No JVD.  No abdominal bruits. No femoral bruits. Abdomen: Bowel sounds are positive, abdomen soft and non-tender without masses or                  Hernia's noted. Msk:  Back normal, normal gait. Normal strength and tone for age. Extremities: No clubbing, cyanosis or edema.  DP +1. PICC line noted to  the right upper arm. Neuro: Alert and oriented X 3. Psych:  Good affect, responds appropriately  TELEMETRY: Reviewed telemetry pt in Atrial fib.  ASSESSMENT AND PLAN:  1 Acute on Chronic diastolic CHF: She is doing and feeling much better. She had a significant diuresis over last 24 hrs with use of metolazone 5 mg given to her by Dr.Gosrani. She has lost approximately 8 pounds and there is no evidence of edema noted on exam this a.m.  She remains deconditioned and has been advised to get up and walk around increased activity and stamina. She remains on IV Lasix 80 mg twice a day.  Creatinine currently 1.90. We will change to 20 mg  Of demedex by mouth twice a day. She remains on 1200 cc fluid restriction, which should be continued at home.  2. Atrial fibrillation: Heart rate is currently controlled on verapamil 180 mg by mouth daily, metoprolol 25 mg twice a day. Coumadin is being monitored by pharmacy with an INR this morning 2.28. Medical compliance issues with medications will be an ongoing challenge. She will need close followup on a monthly or even weekly basis post hospitalization for reinforcement of diet control and medication compliance.  3. Hypokalemia: Potassium 3.3. This a.m. this will be replaced.  4. Anemia: Hemoglobin has been  running between 10.1 and 9.0 during this hospitalization. It is at its lowest at 9.0 on 07/22/2011. We will repeat CBC this a.m. to evaluate status.  Bettey Mare. Lyman Bishop NP [facility] 07/26/2011, 10:05 AM   Cardiology Attending Patient interviewed and examined. Discussed with Joni Reining, NP.  Above note annotated and modified based upon my findings.  CHF improved with adequate diuresis at the expense of some deterioration in renal function.  No further attention to anemia.  Adequate anticoagulation.  Shallotte Bing, MD 07/26/2011, 8:28 PM

## 2011-07-27 LAB — PROTIME-INR: INR: 2.22 — ABNORMAL HIGH (ref 0.00–1.49)

## 2011-07-27 LAB — COMPREHENSIVE METABOLIC PANEL
ALT: 8 U/L (ref 0–35)
AST: 22 U/L (ref 0–37)
CO2: 34 mEq/L — ABNORMAL HIGH (ref 19–32)
Chloride: 95 mEq/L — ABNORMAL LOW (ref 96–112)
Creatinine, Ser: 1.71 mg/dL — ABNORMAL HIGH (ref 0.50–1.10)
GFR calc non Af Amer: 30 mL/min — ABNORMAL LOW (ref >90)
Sodium: 140 mEq/L (ref 135–145)
Total Bilirubin: 0.9 mg/dL (ref 0.3–1.2)

## 2011-07-27 LAB — GLUCOSE, CAPILLARY
Glucose-Capillary: 94 mg/dL (ref 70–99)
Glucose-Capillary: 123 mg/dL — ABNORMAL HIGH (ref 70–99)
Glucose-Capillary: 174 mg/dL — ABNORMAL HIGH (ref 70–99)

## 2011-07-27 LAB — CBC
MCH: 27.4 pg (ref 26.0–34.0)
MCHC: 30.7 g/dL (ref 30.0–36.0)
Platelets: 223 10*3/uL (ref 150–400)
RDW: 17 % — ABNORMAL HIGH (ref 11.5–15.5)

## 2011-07-27 LAB — PHOSPHORUS: Phosphorus: 4.6 mg/dL (ref 2.3–4.6)

## 2011-07-27 MED ORDER — WARFARIN SODIUM 7.5 MG PO TABS
7.5000 mg | ORAL_TABLET | Freq: Every day | ORAL | Status: AC
  Administered 2011-07-27: 7.5 mg via ORAL
  Filled 2011-07-27: qty 1

## 2011-07-27 MED ORDER — POTASSIUM CHLORIDE CRYS ER 20 MEQ PO TBCR
40.0000 meq | EXTENDED_RELEASE_TABLET | ORAL | Status: AC
  Administered 2011-07-27 (×2): 40 meq via ORAL
  Filled 2011-07-27 (×2): qty 2

## 2011-07-27 MED ORDER — TORSEMIDE 20 MG PO TABS
20.0000 mg | ORAL_TABLET | Freq: Two times a day (BID) | ORAL | Status: DC
  Administered 2011-07-27: 20 mg via ORAL
  Filled 2011-07-27: qty 1

## 2011-07-27 MED ORDER — TORSEMIDE 20 MG PO TABS
40.0000 mg | ORAL_TABLET | Freq: Every day | ORAL | Status: DC
  Administered 2011-07-28: 40 mg via ORAL
  Filled 2011-07-27: qty 2

## 2011-07-27 NOTE — Progress Notes (Signed)
CARE MANAGEMENT NOTE 07/27/2011  Patient:  Krystal [name], Krystal [name]   Account Number:  [id number]  Date Initiated:  07/26/2011  Documentation initiated by:  Rosemary Holms  Subjective/Objective Assessment:   Pt admitted with heart faliar and Atrial Fib. PTA lived at home with spouse     Action/Plan:   Pt to be dc'd home with [facility] RN to assist with Medical compliance, daily wts, P checks and reinforcement of low salt diet.   Anticipated DC Date:  07/28/2011   Anticipated DC Plan:  HOME W HOME HEALTH SERVICES      DC Planning Services  CM consult      Choice offered to / List presented to:          [facility] arranged  HH-1 RN  HH-10 DISEASE MANAGEMENT      HH agency  [facility]   Status of service:  In process, will continue to follow Medicare Important Message given?   (If response is "NO", the following Medicare IM given date fields will be blank) Date Medicare IM given:   Date Additional Medicare IM given:    Discharge Disposition:    Per UR Regulation:    Comments:  07/27/11 1200 Elexis Pollak RN BSN CM  07/26/11 1430 Hewitt Garner Leanord Hawking RN BSN CM

## 2011-07-27 NOTE — Progress Notes (Signed)
Subjective: Feels better, breathing improved, still has some swelling in legs  Objective: Vital signs in last 24 hours: Temp:  [97.7 F (36.5 C)-98 F (36.7 C)] 98 F (36.7 C) (02/12 1100) Pulse Rate:  [72-81] 81  (02/12 1100) Resp:  [17-20] 17  (02/12 1100) BP: (87-157)/(60-80) 133/78 mmHg (02/12 1100) SpO2:  [90 %-97 %] 90 % (02/12 1100) Weight:  [123.7 kg (272 lb 11.3 oz)] 123.7 kg (272 lb 11.3 oz) (02/12 0607) Weight change: -4.6 kg (-10 lb 2.3 oz) Last BM Date: 07/25/11  Intake/Output from previous day: 02/11 0701 - 02/12 0700 In: 730 [P.O.:720; I.V.:10] Out: 6400 [Urine:6400] Total I/O In: -  Out: 700 [Urine:700]   Physical Exam: General: Alert, awake, oriented x3, in no acute distress. HEENT: No bruits, no goiter. Heart: Irregular rate and rhythm, without murmurs, rubs, gallops. Lungs: Clear to auscultation bilaterally. Abdomen: Soft, nontender, nondistended, positive bowel sounds. Extremities: No clubbing cyanosis with positive pedal pulses, 2+ edema in LE b/l Neuro: Grossly intact, nonfocal.    Lab Results: Basic Metabolic Panel:  Basename 07/27/11 0445 07/26/11 0505  NA 140 142  K 2.9* 3.3*  CL 95* 100  CO2 34* 31  GLUCOSE 115* 116*  BUN 32* 30*  CREATININE 1.71* 1.90*  CALCIUM 9.9 9.8  MG -- --  PHOS 4.6 --   Liver Function Tests:  Basename 07/27/11 0445 07/26/11 0505  AST 22 23  ALT 8 8  ALKPHOS 105 106  BILITOT 0.9 0.9  PROT 7.2 7.1  ALBUMIN 3.7 3.6   No results found for this basename: LIPASE:2,AMYLASE:2 in the last 72 hours No results found for this basename: AMMONIA:2 in the last 72 hours CBC:  Basename 07/27/11 0445  WBC 8.3  NEUTROABS --  HGB 9.2*  HCT 30.0*  MCV 89.3  PLT 223   Cardiac Enzymes: No results found for this basename: CKTOTAL:3,CKMB:3,CKMBINDEX:3,TROPONINI:3 in the last 72 hours BNP: No results found for this basename: PROBNP:3 in the last 72 hours D-Dimer: No results found for this basename: DDIMER:2 in the  last 72 hours CBG:  Basename 07/27/11 1105 07/27/11 0804 07/26/11 2107 07/26/11 1648 07/26/11 1149 07/26/11 0724  GLUCAP 112* 94 142* 112* 110* 93   Hemoglobin A1C: No results found for this basename: HGBA1C in the last 72 hours Fasting Lipid Panel: No results found for this basename: CHOL,HDL,LDLCALC,TRIG,CHOLHDL,LDLDIRECT in the last 72 hours Thyroid Function Tests: No results found for this basename: TSH,T4TOTAL,FREET4,T3FREE,THYROIDAB in the last 72 hours Anemia Panel: No results found for this basename: VITAMINB12,FOLATE,FERRITIN,TIBC,IRON,RETICCTPCT in the last 72 hours Coagulation:  Basename 07/27/11 0445 07/26/11 0505  LABPROT 25.0* 25.5*  INR 2.22* 2.28*   Urine Drug Screen: Drugs of Abuse  No results found for this basename: labopia, cocainscrnur, labbenz, amphetmu, thcu, labbarb    Alcohol Level: No results found for this basename: ETH:2 in the last 72 hours Urinalysis: No results found for this basename: COLORURINE:2,APPERANCEUR:2,LABSPEC:2,PHURINE:2,GLUCOSEU:2,HGBUR:2,BILIRUBINUR:2,KETONESUR:2,PROTEINUR:2,UROBILINOGEN:2,NITRITE:2,LEUKOCYTESUR:2 in the last 72 hours  No results found for this or any previous visit (from the past 240 hour(s)).  Studies/Results: No results found.  Medications: Scheduled Meds:   . allopurinol  100 mg Oral Daily  . atorvastatin  20 mg Oral q1800  . glipiZIDE  5 mg Oral BH-q7a  . insulin aspart  0-20 Units Subcutaneous TID WC  . insulin aspart  0-5 Units Subcutaneous QHS  . metolazone  2.5 mg Oral Daily  . metoprolol tartrate  25 mg Oral BID  . polysaccharide iron  150 mg Oral Daily  . potassium  chloride  40 mEq Oral BID  . sodium chloride  10-40 mL Intracatheter Q12H  . sodium chloride  3 mL Intravenous Q12H  . torsemide  20 mg Oral BID  . verapamil  180 mg Oral Daily  . warfarin  7.5 mg Oral ONCE-1800  . warfarin  7.5 mg Oral q1800  . DISCONTD: furosemide  40 mg Oral BID   Continuous Infusions:  PRN Meds:.acetaminophen,  bisacodyl, HYDROcodone-homatropine, ondansetron (ZOFRAN) IV, sodium chloride  Assessment/Plan:  Active Problems:  HYPERTENSIVE CARDIOVASCULAR DISEASE, WITH CHF  Atrial fibrillation  HTN (hypertension)  Diastolic CHF, acute on chronic CKD stage 3 Hypokalemia  Plan: Continue with diuresis today, noted change to demadex and metolazone. Replace potassium If she shows continued improvement, then possibly home tomorrow.  Cardiology/Renal assistance appreciated. A fib is rate controlled and she is anticoagulated.   LOS: 5 days   Burrel Legrand [facility] Pager: [phone] 07/27/2011, 12:46 PM

## 2011-07-27 NOTE — Progress Notes (Addendum)
SUBJECTIVE: Feeling better but worried that she has begun to swell again in the lower extremities.   Active Problems:  Diastolic CHF, acute on chronic  Atrial fibrillation  HYPERTENSIVE CARDIOVASCULAR DISEASE, WITH CHF  HTN (hypertension)   LABS: Basic Metabolic Panel:  Basename 07/27/11 0445 07/26/11 0505  NA 140 142  K 2.9* 3.3*  CL 95* 100  CO2 34* 31  GLUCOSE 115* 116*  BUN 32* 30*  CREATININE 1.71* 1.90*  CALCIUM 9.9 9.8  MG -- --  PHOS 4.6 --   Liver Function Tests:  Basename 07/27/11 0445 07/26/11 0505  AST 22 23  ALT 8 8  ALKPHOS 105 106  BILITOT 0.9 0.9  PROT 7.2 7.1  ALBUMIN 3.7 3.6   No results found for this basename: LIPASE:2,AMYLASE:2 in the last 72 hours CBC:  Basename 07/27/11 0445  WBC 8.3  NEUTROABS --  HGB 9.2*  HCT 30.0*  MCV 89.3  PLT 223    RADIOLOGY: Chest Portable 1 View Post Insertion To Confirm Placement As Interpreted By Radiologist  07/25/2011  *RADIOLOGY REPORT*  Clinical Data: PICC line placement  PORTABLE CHEST - 1 VIEW  Comparison: 07/23/2011  Findings: Cardiomegaly again noted.  Probable trace right pleural effusion with right basilar atelectasis.  There is a right arm PICC line with tip in SVC right atrium junction.  No diagnostic pneumothorax.  IMPRESSION: Right arm PICC line with tip in SVC right atrium junction.  No diagnostic pneumothorax.  Cardiomegaly again noted.  Original Report Authenticated By: Natasha Mead, M.D.    PHYSICAL EXAM BP 127/75  Pulse 76  Temp(Src) 97.9 F (36.6 C) (Oral)  Resp 20  Ht 5\' 7"  (1.702 m)  Wt 272 lb 11.3 oz (123.7 kg)  BMI 42.71 kg/m2  SpO2 95%[ WT Loss 18 lbs since admission.] General: Well developed, well nourished, in no acute distress Head: Eyes PERRLA, No xanthomas.   Normal cephalic and atramatic  Lungs: decreased breath sounds; mild exp wheeze. Heart: HRIR ,Pulses are 2+ & equal. No carotid bruit. Mild to mod. JVD.  No abdominal bruits. No femoral bruits. Abdomen: Bowel sounds are  positive, abdomen soft and non-tender without masses or  Hernia's noted. Msk:  Back normal, normal gait. Normal strength and tone for age. Extremities: No clubbing, cyanosis 1+ pitting edema.  DP +1 Neuro: Alert and oriented X 3. Psych:  Good affect, responds appropriately  TELEMETRY: Reviewed telemetry.  Atrial fib with controlled rate  ASSESSMENT AND PLAN:  1. Acute on Chronic Diastolic CHF: She has lost approximately 18 pounds since admission. The patient was placed on oral Lasix twice a day by Dr. Dietrich Pates on evening rounds. She has begun to swell again in the lower extremities.  I will change back to Demadex.  Creatinine is improved but not optimal. I have made an appointment on followup with me next week for continued management of this. I recommended she has home health to reinforce low sodium diet and medical compliance. When she is discharged. I think she needs at least 1 more day of hospitalization and diuresis to continue to get her to dry weight. I think another few pounds or more would be appropriate.  2.Atrial Fib: Rate continues well controlled on current medications. She will continue her verapamil 180 mg by mouth daily. Coumadin will be continued and she will followup in our Coumadin clinic for INR evaluation and dosage adjustment.  3. Hypertension: Blood pressure well-controlled. She will remain on metoprolol along with verapamil as directed. Home health nurse will  be assigned to check on her concerning home. Blood pressures daily weights and medical compliance.  Bettey Mare. Lyman Bishop NP [facility] 07/27/2011, 8:54 AM   Cardiology Attending Patient interviewed and examined. Discussed with Joni Reining, NP.  Above note annotated and modified based upon my findings.  Diuresis of 10 L over the past 2 days with 15 lb weight loss.  Appears to be approaching dry weight.  Agree with plans for discharge.  Decrease O2 sat reported by RN.  Will check ABG on RA-may need home  O2.  CXR 2 days ago->no pulmonary edema or other significant abnormalities.  Hallwood Bing, MD 07/27/2011, 6:01 PM
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
## 2011-07-27 NOTE — Progress Notes (Signed)
Referred to this CSW today for "Medical compliance, daily wts, BP checks and reinforcement of low salt diet". I have spoken with RNCM whom is also aware of this referral/order and will be assisting with this. CSW to sign off- please contact us if SW needs arise. Reece Levy, MSW, Theresia Majors [phone]

## 2011-07-27 NOTE — Progress Notes (Signed)
Subjective: Interval History: has no complaint of difficulty in breathing. She doesn't have any orthopnea or paroxysmal nocturnal dyspnea. Overall she says that she's feeling better..  Objective: Vital signs in last 24 hours: Temp:  [97.7 F (36.5 C)-98 F (36.7 C)] 97.9 F (36.6 C) (02/12 0607) Pulse Rate:  [72-78] 76  (02/12 0607) Resp:  [20] 20  (02/12 0607) BP: (87-157)/(60-80) 127/75 mmHg (02/12 0607) SpO2:  [91 %-97 %] 95 % (02/12 0607) Weight:  [123.7 kg (272 lb 11.3 oz)] 123.7 kg (272 lb 11.3 oz) (02/12 0607) Weight change: -4.6 kg (-10 lb 2.3 oz)  Intake/Output from previous day: 02/11 0701 - 02/12 0700 In: 730 [P.O.:720; I.V.:10] Out: 6400 [Urine:6400] Intake/Output this shift:    General appearance: alert, cooperative and no distress Resp: clear to auscultation bilaterally Cardio: regular rate and rhythm, S1, S2 normal, no murmur, click, rub or gallop GI: soft, non-tender; bowel sounds normal; no masses,  no organomegaly Extremities: edema 1+ edema bilaterally.  Lab Results:  [facility] 07/27/11 0445  WBC 8.3  HGB 9.2*  HCT 30.0*  PLT 223   BMET:  Basename 07/26/11 0505 07/25/11 0635  NA 142 138  K 3.3* 3.1*  CL 100 100  CO2 31 26  GLUCOSE 116* 117*  BUN 30* 25*  CREATININE 1.90* 1.65*  CALCIUM 9.8 9.3   No results found for this basename: PTH:2 in the last 72 hours Iron Studies: No results found for this basename: IRON,TIBC,TRANSFERRIN,FERRITIN in the last 72 hours  Studies/Results: Chest Portable 1 View Post Insertion To Confirm Placement As Interpreted By Radiologist  07/25/2011  *RADIOLOGY REPORT*  Clinical Data: PICC line placement  PORTABLE CHEST - 1 VIEW  Comparison: 07/23/2011  Findings: Cardiomegaly again noted.  Probable trace right pleural effusion with right basilar atelectasis.  There is a right arm PICC line with tip in SVC right atrium junction.  No diagnostic pneumothorax.  IMPRESSION: Right arm PICC line with tip in SVC right atrium  junction.  No diagnostic pneumothorax.  Cardiomegaly again noted.  Original Report Authenticated By: Natasha Mead, M.D.    I have reviewed the patient's current medications.  Assessment/Plan: Problem #1 renal failure chronic stage III. Patient doesn't have any uremic sinus symptoms. Problem #2 anasarca patient presently on diuretics seems to be improving. Patient about 5 L negative was in the last 24 hours. Problem #3 history of diabetes Problem #4 history of a trial fibrillation her heart rate is controlled Problem #5 history of obesity Problem #6 history of hypokalemia she is on potassium supplement Problem #7 history of anemia possibly anemia of chronic disease and also iron deficiency presently she is on iron supplement. Her hemoglobin is 9.2 hematocrit of 30. Plan: As this moment will continue with Lasix and metolazone orally.          Patient repeatedly advised about cutting her salt and fluid intake.          If patient is going to be discharged out for her as an outpatient.    LOS: 5 days   Ivet Guerrieri S 07/27/2011,7:17 AM

## 2011-07-28 LAB — GLUCOSE, CAPILLARY: Glucose-Capillary: 116 mg/dL — ABNORMAL HIGH (ref 70–99)

## 2011-07-28 LAB — MAGNESIUM: Magnesium: 2 mg/dL (ref 1.5–2.5)

## 2011-07-28 LAB — BLOOD GAS, ARTERIAL
Acid-Base Excess: 10.5 mmol/L — ABNORMAL HIGH (ref 0.0–2.0)
O2 Saturation: 97 %
TCO2: 31.8 mmol/L (ref 0–100)
pCO2 arterial: 46.4 mmHg — ABNORMAL HIGH (ref 35.0–45.0)
pO2, Arterial: 86 mmHg (ref 80.0–100.0)

## 2011-07-28 LAB — BASIC METABOLIC PANEL
CO2: 36 mEq/L — ABNORMAL HIGH (ref 19–32)
Calcium: 10.1 mg/dL (ref 8.4–10.5)
Creatinine, Ser: 1.62 mg/dL — ABNORMAL HIGH (ref 0.50–1.10)
GFR calc non Af Amer: 33 mL/min — ABNORMAL LOW (ref >90)
Glucose, Bld: 118 mg/dL — ABNORMAL HIGH (ref 70–99)
Sodium: 140 mEq/L (ref 135–145)

## 2011-07-28 LAB — PROTIME-INR: INR: 2.26 — ABNORMAL HIGH (ref 0.00–1.49)

## 2011-07-28 MED ORDER — POTASSIUM CHLORIDE CRYS ER 20 MEQ PO TBCR
40.0000 meq | EXTENDED_RELEASE_TABLET | Freq: Once | ORAL | Status: AC
  Administered 2011-07-28: 40 meq via ORAL
  Filled 2011-07-28: qty 2

## 2011-07-28 MED ORDER — POTASSIUM CHLORIDE CRYS ER 20 MEQ PO TBCR: 40.0000 meq | EXTENDED_RELEASE_TABLET | Freq: Two times a day (BID) | ORAL | Status: DC

## 2011-07-28 MED ORDER — METOPROLOL TARTRATE 25 MG PO TABS: 25.0000 mg | ORAL_TABLET | Freq: Two times a day (BID) | ORAL | Status: DC

## 2011-07-28 MED ORDER — TORSEMIDE 20 MG PO TABS: 40.0000 mg | ORAL_TABLET | Freq: Every day | ORAL | Status: DC
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
## 2011-07-28 NOTE — Progress Notes (Signed)
07/28/11 1500 Voyd Groft Leanord Hawking RN [facility] Voucher given for DC prescriptions for [facility].

## 2011-07-28 NOTE — Discharge Instructions (Signed)
Heart Failure Heart failure (HF) is a condition in which the heart has trouble pumping blood. This means your heart does not pump blood efficiently for your body to work well. In some cases of HF, fluid may back up into your lungs or you may have swelling (edema) in your lower legs. HF is a long-term (chronic) condition. It is important for you to take good care of yourself and follow your caregiver's treatment plan. CAUSES   Health conditions:   High blood pressure (hypertension) causes the heart muscle to work harder than normal. When pressure in the blood vessels is high, the heart needs to pump (contract) with more force in order to circulate blood throughout the body. High blood pressure eventually causes the heart to become stiff and weak.   Coronary artery disease (CAD) is the buildup of cholesterol and fat (plaques) in the arteries of the heart. The blockage in the arteries deprives the heart muscle of oxygen and blood. This can cause chest pain and may lead to a heart attack. High blood pressure can also contribute to CAD.   Heart attack (myocardial infarction) occurs when 1 or more arteries in the heart become blocked. The loss of oxygen damages the muscle tissue of the heart. When this happens, part of the heart muscle dies. The injured tissue does not contract as well and weakens the heart's ability to pump blood.   Abnormal heart valves can cause HF when the heart valves do not open and close properly. This makes the heart muscle pump harder to keep the blood flowing.   Heart muscle disease (cardiomyopathy or myocarditis) is damage to the heart muscle from a variety of causes. These can include drug or alcohol abuse, infections, or unknown reasons. These can increase the risk of HF.   Lung disease makes the heart work harder because the lungs do not work properly. This can cause a strain on the heart leading it to fail.   Diabetes increases the risk of HF. High blood sugar contributes  to high fat (lipid) levels in the blood. Diabetes can also cause slow damage to tiny blood vessels that carry important nutrients to the heart muscle. When the heart does not get enough oxygen and food, it can cause the heart to become weak and stiff. This leads to a heart that does not contract efficiently.   Other diseases can contribute to HF. These include abnormal heart rhythms, thyroid problems, and low blood counts (anemia).   Unhealthy lifestyle habits:   Obesity.   Smoking.   Eating foods high in fat and cholesterol.   Eating or drinking beverages high in salt.   Drug or alcohol abuse.   Lack of exercise.  SYMPTOMS  HF symptoms may vary and can be hard to detect. Symptoms may include:  Shortness of breath with activity, such as climbing stairs.   Persistent cough.   Swelling of the feet, ankles, legs, or abdomen.   Unexplained weight gain.   Difficulty breathing when lying flat.   Waking from sleep because of the need to sit up and get more air.   Rapid heartbeat.   Fatigue and loss of energy.   Feeling lightheaded or close to fainting.  DIAGNOSIS  A diagnosis of HF is based on your history, symptoms, physical examination, and diagnostic tests. Diagnostic tests for HF may include:  EKG.   Chest X-ray.   Blood tests.   Exercise stress test.   Blood oxygen test (arterial blood gas).   Evaluation   by a heart doctor (cardiologist).   Ultrasound evaluation of the heart (echocardiogram).   Heart artery test to look for blockages (angiogram).   Radioactive imaging to look at the heart (radionuclide test).  TREATMENT  Treatment is aimed at managing the symptoms of HF. Medicines, lifestyle changes, or surgical intervention may be necessary to treat HF.  Medicines to help treat HF may include:   Angiotensin-converting enzyme (ACE) inhibitors. These block the effects of a blood protein called angiotensin-converting enzyme. ACE inhibitors relax (dilate) the  blood vessels and help lower blood pressure. This decreases the workload of the heart, slows the progression of HF, and improves symptoms.   Angiotensin receptor blockers (ARBs). These medications work similar to ACE inhibitors. ARBs may be an alternative for people who cannot tolerate an ACE inhibitor.   Aldosterone antagonists. This medication helps get rid of extra fluid from your body. This lowers the volume of blood the heart has to pump.   Water pills (diuretics). Diuretics cause the kidneys to remove salt and water from the blood. The extra fluid is removed by urination. By removing extra fluid from the body, diuretics help lower the workload of the heart and help prevent fluid buildup in the lungs so breathing is easier.   Beta blockers. These prevent the heart from beating too fast and improve heart muscle strength. Beta blockers help maintain a normal heart rate, control blood pressure, and improve HF symptoms.   Digitalis. This increases the force of the heartbeat and may be helpful to people with HF or heart rhythm problems.   Healthy lifestyle changes include:   Stopping smoking.   Eating a healthy diet. Avoid foods high in fat. Avoid foods fried in oil or made with fat. A dietician can help with healthy food choices.   Limiting how much salt you eat.   Limiting alcohol intake to no more than 1 drink per day for women and 2 drinks per day for men. Drinking more than that is harmful to your heart. If your heart has already been damaged by alcohol or you have severe HF, drinking alcohol should be stopped completely.   Exercising as directed by your caregiver.   Surgical treatment for HF may include:   Procedures to open blocked arteries, repair damaged heart valves, or remove damaged heart muscle tissue.   A pacemaker to help heart muscle function and to control certain abnormal heart rhythms.   A defibrillator to possibly prevent sudden cardiac death.  HOME CARE  INSTRUCTIONS   Activity level. Your caregiver can help you determine what type of exercise program may be helpful. It is important to maintain your strength. Pace your physical activity to avoid shortness of breath or chest pain. Rest for 1 hour before and after meals. A cardiac rehabilitation program may be helpful to some people with HF.   Diet. Eat a heart healthy diet. Food choices should be low in saturated fat and cholesterol. Talk to a dietician to learn about heart healthy foods.   Salt intake. When you have HF, you need to limit the amount of salt you eat. Eat less than 1500 milligrams (mg) of salt per day or as recommended by your caregiver.   Weight monitoring. Weigh yourself every day. You should weigh yourself in the morning after you urinate and before you eat breakfast. Wear the same amount of clothing each time you weigh yourself. Record your weight daily. Bring your recorded weights to your clinic visits. Tell your caregiver right away if   you have gained 3 lb/1.4 kg in 1 day, or 5 lb/2.3 kg in a week or whatever amount you were told to report.   Blood pressure monitoring. This should be done as directed by your caregiver. A home blood pressure cuff can be purchased at a drugstore. Record your blood pressure numbers and bring them to your clinic visits. Tell your caregiver if you become dizzy or lightheaded upon standing up.   Smoking. If you are currently a smoker, it is time to quit. Nicotine makes your heart work harder by causing your blood vessels to constrict. Do not use nicotine gum or patches before talking to your caregiver.   Follow up. Be sure to schedule a follow-up visit with your caregiver. Keep all your appointments.  SEEK MEDICAL CARE IF:   Your weight increases by 3 lb/1.4 kg in 1 day or 5 lb/2.3 kg in a week.   You notice increasing shortness of breath that is unusual for you. This may happen during rest, sleep, or with activity.   You cough more than normal,  especially with physical activity.   You notice more swelling in your hands, feet, ankles, or belly (abdomen).   You are unable to sleep because it is hard to breathe.   You cough up bloody mucus (sputum).   You begin to feel "jumping" or "fluttering" sensations (palpitations) in your chest.  SEEK IMMEDIATE MEDICAL CARE IF:   You have severe chest pain or pressure which may include symptoms such as:   Pain or pressure in the arms, neck, jaw, or back.   Feeling sweaty.   Feeling sick to your stomach (nauseous).   Feeling short of breath while at rest.   Having a fast or irregular heartbeat.   You experience stroke symptoms. These symptoms include:   Facial weakness or numbness.   Weakness or numbness in an arm, leg, or on one side of your body.   Blurred vision.   Difficulty talking or thinking.   Dizziness or fainting.   Severe headache.  THESE ARE MEDICAL EMERGENCIES. Do not wait to see if the symptoms go away. Call your local emergency services (911 in [place] DO NOT drive yourself to the hospital. IMPORTANT  Make a list of every medicine, vitamin, or herbal supplement you are taking. Keep the list with you at all times. Show it to your caregiver at every visit. Keep the list up-to-date.   Ask your caregiver or pharmacist to write an explanation of each medicine you are taking. This should include:   Why you are taking it.   The possible side effects.   The best time of day to take it.   Foods to take with it or what foods to avoid.   When to stop taking it.  MAKE SURE YOU:   Understand these instructions.   Will watch your condition.   Will get help right away if you are not doing well or get worse.  Document Released: 05/31/2005 Document Revised: 02/10/2011 Document Reviewed: 09/12/2009 [employer] Patient Information 2012 [employer]. 

## 2011-07-28 NOTE — Discharge Summary (Signed)
Physician Discharge Summary  Patient ID: Krystal [name] MRN: [id number] DOB/AGE: 1946/03/28 66 y.o.  Admit date: 07/22/2011 Discharge date: 07/28/2011  Primary Care Physician:  Krystal Found, MD   Discharge Diagnoses:    Active Problems:  HYPERTENSIVE CARDIOVASCULAR DISEASE, WITH CHF  Atrial fibrillation  HTN (hypertension)  Diastolic CHF, acute on chronic Hypokalemia Diabetes Mellitus  Chronic kidney disease stage 3  Medication List  As of 07/28/2011  1:56 PM   STOP taking these medications         acetaminophen 500 MG tablet      furosemide 40 MG tablet      HYDROMET 5-1.5 MG/5ML syrup         TAKE these medications         allopurinol 100 MG tablet   Commonly known as: ZYLOPRIM   Take 100 mg by mouth daily.      atorvastatin 20 MG tablet   Commonly known as: LIPITOR   Take 20 mg by mouth at bedtime.      glipiZIDE 5 MG 24 hr tablet   Commonly known as: GLUCOTROL XL   Take 5 mg by mouth every morning.      metoprolol tartrate 25 MG tablet   Commonly known as: LOPRESSOR   Take 1 tablet (25 mg total) by mouth 2 (two) times daily.      polysaccharide iron 150 MG Caps capsule   Commonly known as: NIFEREX   Take 1 capsule (150 mg total) by mouth daily.      potassium chloride SA 20 MEQ tablet   Commonly known as: K-DUR,KLOR-CON   Take 2 tablets (40 mEq total) by mouth 2 (two) times daily.      SALINE NASAL MIST NA   Place 1 spray into the nose as needed. For congestion      torsemide 20 MG tablet   Commonly known as: DEMADEX   Take 2 tablets (40 mg total) by mouth daily.      verapamil 240 MG CR tablet   Commonly known as: CALAN-SR   Take 1 tablet (240 mg total) by mouth daily.      warfarin 5 MG tablet   Commonly known as: COUMADIN   Take 10 mg by mouth daily.           Discharge Exam: Blood pressure 106/68, pulse 76, temperature 98.6 F (37 C), temperature source Oral, resp. rate 18, height 5\' 7"  (1.702 m), weight 119.8 kg (264 lb 1.8 oz),  SpO2 91.00%. NAD CTA B S1, S2, RRR Soft, Nt, BS+ 1+ edema b/l  Disposition and Follow-up:  Follow up with [place] cardiology in 2/20 Follow up with Dr. Kristian [name] in 3 weeks Follow up with primary doctor in 2 weeks  Consults:  [facility] cardiology Nephrology, Dr. Kristian [name]   Significant Diagnostic Studies:  Dg Chest 2 View  07/23/2011  *RADIOLOGY REPORT*  Clinical Data: Congestive heart failure  CHEST - 2 VIEW  Comparison: Chest x-ray of 06/23/2011  Findings:  There is cardiomegaly present with minimal pulmonary vascular congestion.  The small right effusion noted on recent chest x-ray has increased minimally.  Mild left basilar atelectasis is present.  No bony abnormality is seen.  IMPRESSION:  Cardiomegaly and mild pulmonary vascular congestion with very slight increase in small right pleural effusion.  Original Report Authenticated By: Juline Patch, M.D.    Brief H and P: For complete details please refer to admission H and P, but in brief This 66 year old lady presents again with  the above symptoms. She was recently hospitalized in the latter part of December with congestive heart failure. She was discharged on end of December 2012. Since that time she feels that she is gotten worse with increasing swelling and increasing dyspnea. There has been confusion about her medications and it seems that she has not been taking her Lasix as prescribed. She was seen in the cardiology clinic and there was concern about worsening congestive heart failure. Therefore she was directly admitted to the floor. She has not had any acute chest pain. She is a lady who is known to have chronic diastolic congestive heart failure, chronic atrial fibrillation, hypertension and diabetes.     Hospital Course:  Patient was aggressively diuresed with IV lasix.  She was followed by the cardiology service while in the hospital.  She diuresed over 10L and has had a greater than 15lb weight loss.  She was initially started  on IV lasix and metolazone was added to her regimen.  Her lasix was subsequently changed over to demadex, and she has tolerated this well.  She is feeling significantly improved. She ambulated in the halls on room air without any difficulty, and maintained an oxygen saturation of 92%.  She continues to have good diuresis with demadex, and we will discharge her with the same. She has received extensive education regarding CHF management, and will be set up with home health services to re inforce this.  She has close follow up next week set up with the cardiology service.  She did have an elevation in her creatinine after admission.  This was felt to be due to diuresis and has remained stable.  She was followed by nephrology in the hospital and has scheduled outpatient follow up with Dr. Kristian [name]  Her atrial fib has remained rate controlled and she is on coumadin  HTN is also well controlled  Plan to discharge home later today, she does not qualify for home oxygen.  Time spent on Discharge:  Signed: Dashayla [name] [facility] Pager: [phone] 07/28/2011, 1:56 PM

## 2011-07-28 NOTE — Progress Notes (Signed)
Patient ambulated in hallway without difficulty. RA sat 92%. No C/O pain or discomfort noted.

## 2011-07-28 NOTE — Progress Notes (Signed)
Subjective:  Feeling better and hoping to go home. Objective:  Vital Signs in the last 24 hours: Temp:  [97.7 F (36.5 C)-98.2 F (36.8 C)] 97.7 F (36.5 C) (02/13 0529) Pulse Rate:  [69-80] 71  (02/13 1141) Resp:  [20] 20  (02/13 0529) BP: (101-149)/(63-84) 127/77 mmHg (02/13 1141) SpO2:  [87 %-99 %] 96 % (02/13 0529) Weight:  [264 lb 1.8 oz (119.8 kg)] 264 lb 1.8 oz (119.8 kg) (02/13 0529)  Intake/Output from previous day: 02/12 0701 - 02/13 0700 In: 1200 [P.O.:1200] Out: 3950 [Urine:3950] I Physical Exam: NECK: Without JVD, HJR, or bruit LUNGS: Clear anterior, posterior, lateral HEART: Regular rate and rhythm, no murmur, gallop, rub, bruit, thrill, or heave EXTREMITIES: +1-2 edema up to her knees   Basename 07/27/11 0445  WBC 8.3  HGB 9.2*  PLT 223    Basename 07/28/11 0503 07/27/11 0445  NA 140 140  K 3.0* 2.9*  CL 94* 95*  CO2 36* 34*  GLUCOSE 118* 115*  BUN 33* 32*  CREATININE 1.62* 1.71*   No results found for this basename: TROPONINI:2,CK,MB:2 in the last 72 hours Hepatic Function Panel  Basename 07/27/11 0445  PROT 7.2  ALBUMIN 3.7  AST 22  ALT 8  ALKPHOS 105  BILITOT 0.9  BILIDIR --  IBILI --   Assessment/Plan:  1. Acute on Chronic Diastolic CHF: Weight down another 8 pounds since yesterday I suspect this is an inaccurate weight.pO2 was 86 on ABG but it looks like she was still on O2. Patient will need close outpatient followup.Will recheck ABG on RA.  2.Atrial Fib: Rate continues well controlled on current medications. She will continue her verapamil 180 mg by mouth daily. Coumadin will be continued and she will followup in our Coumadin clinic for INR evaluation and dosage adjustment.  3. Hypertension: Blood pressure well-controlled. She will remain on metoprolol along with verapamil as directed. Home health nurse will be assigned to check on her concerning home. Blood pressures daily weights and medical compliance.  Jacolyn Reedy 07/28/2011,  12:49 PM   Cardiology Attending Patient interviewed and examined. Discussed with Jacolyn Reedy, PA-C.  Above note annotated and modified based upon my findings.  Pt appears to be at dry weight.  Renal function continues to improve with better control of CHF.  She will require careful management from the office after hospital discharge.  ABG obtained on 2L O2 contrary to order.  Results will not be useful to justify home O2.  We can try discharge without it and monitoring of sats at home and in office.  [place] Bing, MD 07/28/2011, 7:20 PM

## 2011-07-28 NOTE — Progress Notes (Signed)
CARE MANAGEMENT NOTE 07/28/2011  Patient:  Krystal [name], Krystal [name]   Account Number:  [id number]  Date Initiated:  07/26/2011  Documentation initiated by:  Rosemary Holms  Subjective/Objective Assessment:   Pt admitted with heart faliar and Atrial Fib. PTA lived at home with spouse     Action/Plan:   Pt to be dc'd home with [facility] RN to assist with Medical compliance, daily wts, P checks and reinforcement of low salt diet.   Anticipated DC Date:  07/28/2011   Anticipated DC Plan:  HOME W HOME HEALTH SERVICES      DC Planning Services  CM consult      Choice offered to / List presented to:          [facility] arranged  HH-1 RN  HH-10 DISEASE MANAGEMENT      HH agency  [facility]   Status of service:  Completed, signed off Medicare Important Message given?   (If response is "NO", the following Medicare IM given date fields will be blank) Date Medicare IM given:   Date Additional Medicare IM given:    Discharge Disposition:    Per UR Regulation:    Comments:  07/28/11/ 1230 Sedric Guia RN BSN CM Dr. Lamar Blinks PA Dwyane Luo agreed to take pt for [facility] updates and orders. DC summary faxed to Golding's office with pt's permission.  07/27/11 1200 Temperence Zenor RN BSN CM  07/26/11 1430 Marqueze Ramcharan Leanord Hawking RN BSN CM

## 2011-07-28 NOTE — Plan of Care (Signed)
Problem: Discharge Progression Outcomes Goal: Other Discharge Outcomes/Goals Outcome: Completed/Met Date Met:  07/28/11 Home Health to follow up with patient.

## 2011-07-28 NOTE — Progress Notes (Addendum)
Patient discharged with instructions given on medications,and follow up visits,patient ,and family verbalized understanding. Prescriptions sent home with patient.Heart failure packet given and explained to patient. PICC line d/c'ed by Eliberto Ivory RN,no C/O pain or discomfort noted.Home Health to follow up with patient.Accompanied by staff to an awaiting vehicle.

## 2011-07-30 ENCOUNTER — Telehealth: Payer: Self-pay | Admitting: *Deleted

## 2011-07-30 NOTE — Telephone Encounter (Signed)
Pt was d/c from [facility] 07/28/11 Dx Atrial fib and CHF.  She was d/c home on coumadin 10mg  daily.  INR 07/28/11 was 2.26.  [facility] is seeing pt at home.  Order given for them to recheck INR on 08/02/11 and continue coumadin 10mg  daily.

## 2011-08-02 ENCOUNTER — Ambulatory Visit: Payer: Self-pay | Admitting: *Deleted

## 2011-08-02 DIAGNOSIS — Z7901 Long term (current) use of anticoagulants: Secondary | ICD-10-CM

## 2011-08-02 DIAGNOSIS — I4891 Unspecified atrial fibrillation: Secondary | ICD-10-CM

## 2011-08-04 ENCOUNTER — Ambulatory Visit (INDEPENDENT_AMBULATORY_CARE_PROVIDER_SITE_OTHER): Payer: Self-pay | Admitting: *Deleted

## 2011-08-04 ENCOUNTER — Encounter: Payer: Self-pay | Admitting: Adult Health

## 2011-08-04 ENCOUNTER — Ambulatory Visit (INDEPENDENT_AMBULATORY_CARE_PROVIDER_SITE_OTHER): Payer: Self-pay | Admitting: Adult Health

## 2011-08-04 DIAGNOSIS — I4891 Unspecified atrial fibrillation: Secondary | ICD-10-CM

## 2011-08-04 DIAGNOSIS — I509 Heart failure, unspecified: Secondary | ICD-10-CM

## 2011-08-04 DIAGNOSIS — R0602 Shortness of breath: Secondary | ICD-10-CM

## 2011-08-04 DIAGNOSIS — Z7901 Long term (current) use of anticoagulants: Secondary | ICD-10-CM

## 2011-08-04 DIAGNOSIS — I10 Essential (primary) hypertension: Secondary | ICD-10-CM

## 2011-08-04 DIAGNOSIS — I5033 Acute on chronic diastolic (congestive) heart failure: Secondary | ICD-10-CM

## 2011-08-04 LAB — POCT INR: INR: 3.2

## 2011-08-04 MED ORDER — POTASSIUM CHLORIDE CRYS ER 20 MEQ PO TBCR: 40.0000 meq | EXTENDED_RELEASE_TABLET | Freq: Two times a day (BID) | ORAL | Status: DC

## 2011-08-04 MED ORDER — POLYSACCHARIDE IRON 150 MG PO CAPS: 150.0000 mg | ORAL_CAPSULE | Freq: Every day | ORAL | Status: DC
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
## 2011-08-04 NOTE — Assessment & Plan Note (Addendum)
She has excellent control of her weight and fluid status. She is medically compliant and without complaint. Wt loss of 8 more pounds since discharge. She weighs everyday and avoids salt. I have encouraged her in this new lifestyle change. I will check a BMET, CBC and BNP for follow-up. She is having some muscle pain and therefore potassium status will be watched closely.She will have close follow-up with me for a while. I will see her in 2 weeks unless symptomatic.She is given refills on potassium and iron.

## 2011-08-04 NOTE — Assessment & Plan Note (Signed)
Heart rate and blood pressure are well controlled. No changes in medications at this time.

## 2011-08-04 NOTE — Patient Instructions (Signed)
Your physician recommends that you schedule a follow-up appointment in: 2 WEEKS  Your physician recommends that you return for lab work in: TODAY

## 2011-08-05 ENCOUNTER — Encounter (HOSPITAL_COMMUNITY): Payer: Self-pay

## 2011-08-05 ENCOUNTER — Telehealth: Payer: Self-pay | Admitting: *Deleted

## 2011-08-05 ENCOUNTER — Other Ambulatory Visit: Payer: Self-pay

## 2011-08-05 ENCOUNTER — Emergency Department (HOSPITAL_COMMUNITY)
Admission: EM | Admit: 2011-08-05 | Discharge: 2011-08-05 | Disposition: A | Discharge status: Home / Self Care | Payer: Medicare Other | Attending: Emergency Medicine | Admitting: Emergency Medicine

## 2011-08-05 ENCOUNTER — Other Ambulatory Visit: Payer: Self-pay | Admitting: *Deleted

## 2011-08-05 DIAGNOSIS — E119 Type 2 diabetes mellitus without complications: Secondary | ICD-10-CM | POA: Insufficient documentation

## 2011-08-05 DIAGNOSIS — E669 Obesity, unspecified: Secondary | ICD-10-CM | POA: Insufficient documentation

## 2011-08-05 DIAGNOSIS — I10 Essential (primary) hypertension: Secondary | ICD-10-CM | POA: Insufficient documentation

## 2011-08-05 DIAGNOSIS — N289 Disorder of kidney and ureter, unspecified: Secondary | ICD-10-CM | POA: Insufficient documentation

## 2011-08-05 DIAGNOSIS — Z8739 Personal history of other diseases of the musculoskeletal system and connective tissue: Secondary | ICD-10-CM | POA: Insufficient documentation

## 2011-08-05 DIAGNOSIS — Z79899 Other long term (current) drug therapy: Secondary | ICD-10-CM | POA: Insufficient documentation

## 2011-08-05 DIAGNOSIS — I509 Heart failure, unspecified: Secondary | ICD-10-CM | POA: Insufficient documentation

## 2011-08-05 LAB — POCT I-STAT, CHEM 8
BUN: 53 mg/dL — ABNORMAL HIGH (ref 6–23)
Calcium, Ion: 1.16 mmol/L (ref 1.12–1.32)
Chloride: 104 mEq/L (ref 96–112)
Creatinine, Ser: 2.1 mg/dL — ABNORMAL HIGH (ref 0.50–1.10)
Glucose, Bld: 144 mg/dL — ABNORMAL HIGH (ref 70–99)
Potassium: 4.2 mEq/L (ref 3.5–5.1)

## 2011-08-05 LAB — BASIC METABOLIC PANEL
BUN: 57 mg/dL — ABNORMAL HIGH (ref 6–23)
CO2: 25 mEq/L (ref 19–32)
Chloride: 98 mEq/L (ref 96–112)
Glucose, Bld: 251 mg/dL — ABNORMAL HIGH (ref 70–99)
Glucose, Bld: 148 mg/dL — ABNORMAL HIGH (ref 70–99)
Potassium: 6.4 mEq/L (ref 3.5–5.3)
Potassium: 4.1 mEq/L (ref 3.5–5.1)
Sodium: 136 mEq/L (ref 135–145)

## 2011-08-05 LAB — CBC
HCT: 37.6 % (ref 36.0–46.0)
Hemoglobin: 11.5 g/dL — ABNORMAL LOW (ref 12.0–15.0)
MCV: 89.1 fL (ref 78.0–100.0)
RBC: 4.22 MIL/uL (ref 3.87–5.11)
RDW: 17.1 % — ABNORMAL HIGH (ref 11.5–15.5)
WBC: 13 10*3/uL — ABNORMAL HIGH (ref 4.0–10.5)

## 2011-08-05 MED ORDER — POTASSIUM CHLORIDE CRYS ER 20 MEQ PO TBCR: EXTENDED_RELEASE_TABLET | ORAL | Status: DC
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
## 2011-08-05 NOTE — Telephone Encounter (Signed)
Reviewed. I sent you a note about this. She is to stop her potassium for a couple of days.

## 2011-08-05 NOTE — Telephone Encounter (Signed)
Called patient with critical K+ of 6.4.  Advised her to go to the Emergency Department for treatment.  States she will go asap.  Has been feeling nauseated.  No other c/o voiced.

## 2011-08-05 NOTE — ED Notes (Signed)
States she was told to come here by her pcp for elevated potassium

## 2011-08-06 ENCOUNTER — Ambulatory Visit: Payer: Self-pay | Admitting: *Deleted

## 2011-08-06 DIAGNOSIS — I4891 Unspecified atrial fibrillation: Secondary | ICD-10-CM

## 2011-08-06 DIAGNOSIS — Z7901 Long term (current) use of anticoagulants: Secondary | ICD-10-CM

## 2011-08-10 ENCOUNTER — Telehealth: Payer: Self-pay

## 2011-08-10 ENCOUNTER — Ambulatory Visit: Payer: Self-pay | Admitting: *Deleted

## 2011-08-10 ENCOUNTER — Ambulatory Visit (INDEPENDENT_AMBULATORY_CARE_PROVIDER_SITE_OTHER): Payer: Self-pay | Admitting: Adult Health

## 2011-08-10 ENCOUNTER — Encounter: Payer: Self-pay | Admitting: Adult Health

## 2011-08-10 VITALS — BP 127/62 | HR 45 | Ht 67.0 in | Wt 257.0 lb

## 2011-08-10 DIAGNOSIS — Z7901 Long term (current) use of anticoagulants: Secondary | ICD-10-CM

## 2011-08-10 DIAGNOSIS — I4891 Unspecified atrial fibrillation: Secondary | ICD-10-CM

## 2011-08-10 DIAGNOSIS — I5033 Acute on chronic diastolic (congestive) heart failure: Secondary | ICD-10-CM

## 2011-08-10 DIAGNOSIS — D509 Iron deficiency anemia, unspecified: Secondary | ICD-10-CM

## 2011-08-10 DIAGNOSIS — I509 Heart failure, unspecified: Secondary | ICD-10-CM

## 2011-08-10 DIAGNOSIS — I10 Essential (primary) hypertension: Secondary | ICD-10-CM

## 2011-08-10 MED ORDER — METOLAZONE 2.5 MG PO TABS: 2.5000 mg | ORAL_TABLET | Freq: Every day | ORAL | Status: DC | PRN

## 2011-08-10 MED ORDER — TORSEMIDE 20 MG PO TABS: 40.0000 mg | ORAL_TABLET | Freq: Every day | ORAL | Status: DC
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
## 2011-08-10 NOTE — Assessment & Plan Note (Signed)
She has indeed gained 5 lbs since last visit. Demedex had been decreased to 30 mg daily from 40 mg daily. I will increase her Demedex back to 40 mg daily. Will add metolazone 2.5 mg prn excessive weight gain. I have referred her to Dr. Teressa Lower for HF clinic evaluation and further recommendations. This has been explained to the patient who is willing to be seen in [place] for this.   Repeat BMET in one week. Continue daily wts and low salt diet.

## 2011-08-10 NOTE — Patient Instructions (Addendum)
**Note De-Identified Krystal [name] Obfuscation** Your physician recommends that you return for lab work in: August 17, 2011  Your physician has recommended you make the following change in your medication: increase Torsemide (Demadex) to 40 mg daily and start taking Metolazone 2.5 mg as needed for weight gain that is not helped by usual dose of Torsemide  Your provider is referring you to Dr. Gala Romney at our CHF clinic in [facility]  Your physician recommends that you schedule a follow-up appointment in: 3 months

## 2011-08-10 NOTE — Assessment & Plan Note (Signed)
Most recent Hbg 11.9 with Hct 35.0. She will continue iron replacement and follow-up with PCP. No active bleeding.

## 2011-08-11 ENCOUNTER — Encounter: Payer: Self-pay | Admitting: Cardiology

## 2011-08-12 ENCOUNTER — Telehealth: Payer: Self-pay

## 2011-08-17 ENCOUNTER — Ambulatory Visit: Payer: Self-pay | Admitting: Adult Health

## 2011-08-18 ENCOUNTER — Encounter: Payer: Self-pay | Admitting: *Deleted

## 2011-08-18 ENCOUNTER — Ambulatory Visit: Payer: Self-pay | Admitting: Adult Health

## 2011-08-18 ENCOUNTER — Ambulatory Visit (INDEPENDENT_AMBULATORY_CARE_PROVIDER_SITE_OTHER): Payer: Self-pay | Admitting: *Deleted

## 2011-08-18 ENCOUNTER — Other Ambulatory Visit: Payer: Self-pay

## 2011-08-18 DIAGNOSIS — Z7901 Long term (current) use of anticoagulants: Secondary | ICD-10-CM

## 2011-08-18 DIAGNOSIS — I4891 Unspecified atrial fibrillation: Secondary | ICD-10-CM

## 2011-08-18 LAB — BASIC METABOLIC PANEL
BUN: 30 mg/dL — ABNORMAL HIGH (ref 6–23)
Chloride: 102 mEq/L (ref 96–112)
Glucose, Bld: 149 mg/dL — ABNORMAL HIGH (ref 70–99)
Potassium: 3.9 mEq/L (ref 3.5–5.3)
Sodium: 140 mEq/L (ref 135–145)

## 2011-08-18 MED ORDER — POTASSIUM CHLORIDE CRYS ER 20 MEQ PO TBCR: EXTENDED_RELEASE_TABLET | ORAL | Status: DC

## 2011-08-18 MED ORDER — WARFARIN SODIUM 5 MG PO TABS: 5.0000 mg | ORAL_TABLET | Freq: Every day | ORAL | Status: DC

## 2011-09-02 ENCOUNTER — Ambulatory Visit (INDEPENDENT_AMBULATORY_CARE_PROVIDER_SITE_OTHER): Payer: Self-pay | Admitting: *Deleted

## 2011-09-02 DIAGNOSIS — I4891 Unspecified atrial fibrillation: Secondary | ICD-10-CM

## 2011-09-02 DIAGNOSIS — Z7901 Long term (current) use of anticoagulants: Secondary | ICD-10-CM

## 2011-09-06 ENCOUNTER — Encounter (HOSPITAL_COMMUNITY): Payer: Self-pay

## 2011-09-23 ENCOUNTER — Ambulatory Visit (INDEPENDENT_AMBULATORY_CARE_PROVIDER_SITE_OTHER): Payer: Self-pay | Admitting: *Deleted

## 2011-09-23 DIAGNOSIS — Z7901 Long term (current) use of anticoagulants: Secondary | ICD-10-CM

## 2011-09-23 DIAGNOSIS — I4891 Unspecified atrial fibrillation: Secondary | ICD-10-CM

## 2011-10-21 ENCOUNTER — Ambulatory Visit (INDEPENDENT_AMBULATORY_CARE_PROVIDER_SITE_OTHER): Payer: BC Managed Care – PPO | Admitting: *Deleted

## 2011-10-21 DIAGNOSIS — Z7901 Long term (current) use of anticoagulants: Secondary | ICD-10-CM

## 2011-10-21 DIAGNOSIS — I4891 Unspecified atrial fibrillation: Secondary | ICD-10-CM

## 2011-10-21 LAB — POCT INR: INR: 2.5

## 2011-10-21 MED ORDER — WARFARIN SODIUM 5 MG PO TABS: 5.0000 mg | ORAL_TABLET | Freq: Every day | ORAL | Status: DC

## 2011-10-25 ENCOUNTER — Encounter (HOSPITAL_COMMUNITY): Payer: Self-pay

## 2011-10-25 ENCOUNTER — Ambulatory Visit (HOSPITAL_COMMUNITY)
Admission: RE | Admit: 2011-10-25 | Discharge: 2011-10-25 | Disposition: A | Discharge status: Home / Self Care | Payer: Medicare Other | Source: Ambulatory Visit | Attending: Internal Medicine | Admitting: Internal Medicine

## 2011-10-25 VITALS — BP 130/78 | HR 80 | Ht 67.0 in | Wt 253.0 lb

## 2011-10-25 DIAGNOSIS — I4891 Unspecified atrial fibrillation: Secondary | ICD-10-CM

## 2011-10-25 DIAGNOSIS — I5032 Chronic diastolic (congestive) heart failure: Secondary | ICD-10-CM

## 2011-10-25 DIAGNOSIS — T50904A Poisoning by unspecified drugs, medicaments and biological substances, undetermined, initial encounter: Secondary | ICD-10-CM

## 2011-10-25 DIAGNOSIS — I509 Heart failure, unspecified: Secondary | ICD-10-CM | POA: Insufficient documentation

## 2011-10-25 LAB — BASIC METABOLIC PANEL
CO2: 32 mEq/L (ref 19–32)
Calcium: 9.7 mg/dL (ref 8.4–10.5)
Chloride: 99 mEq/L (ref 96–112)
Glucose, Bld: 230 mg/dL — ABNORMAL HIGH (ref 70–99)
Sodium: 140 mEq/L (ref 135–145)

## 2011-10-25 NOTE — Assessment & Plan Note (Addendum)
A fib rate 81. Continue current regimen.   Attending: Stable. Rate controlled. Continue anticoagulation.
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
## 2011-10-25 NOTE — Assessment & Plan Note (Addendum)
Explained purpose of heart failure clinic. Overall, her volume status seems to be well managed with Torsemide and Metolazone as needed. Lengthy discussion regarding daily weights, low salt diet /food choices, and medication compliance.  Follow up in 1 month.   Patient seen and examined with Tonye Becket, NP. We discussed all aspects of the encounter. I agree with the assessment and plan as stated above. Echo reviewed personally. She has components of R and L heart failure. PA pressures improved from previous. Currently volume status looks quite good. Reinforced need for daily weights and reviewed use of sliding scale diuretics at length. Also provided dietary education.

## 2011-10-25 NOTE — Patient Instructions (Signed)
Follow up in 1 month   Do the following things EVERYDAY: 1) Weigh yourself in the morning before breakfast. Write it down and keep it in a log. 2) Take your medicines as prescribed 3) Eat low salt foods-Limit salt (sodium) to 2000 mg per day.  4) Stay as active as you can everyday 5) Limit all fluids for the day to less than 2 liters 

## 2011-11-03 ENCOUNTER — Ambulatory Visit (INDEPENDENT_AMBULATORY_CARE_PROVIDER_SITE_OTHER): Payer: BC Managed Care – PPO | Admitting: Physician Assistant

## 2011-11-03 ENCOUNTER — Ambulatory Visit (INDEPENDENT_AMBULATORY_CARE_PROVIDER_SITE_OTHER): Payer: BC Managed Care – PPO | Admitting: *Deleted

## 2011-11-03 ENCOUNTER — Encounter: Payer: Self-pay | Admitting: Physician Assistant

## 2011-11-03 VITALS — BP 156/89 | HR 82 | Ht 67.0 in | Wt 246.0 lb

## 2011-11-03 DIAGNOSIS — I509 Heart failure, unspecified: Secondary | ICD-10-CM

## 2011-11-03 DIAGNOSIS — I10 Essential (primary) hypertension: Secondary | ICD-10-CM

## 2011-11-03 DIAGNOSIS — E119 Type 2 diabetes mellitus without complications: Secondary | ICD-10-CM

## 2011-11-03 DIAGNOSIS — Z7901 Long term (current) use of anticoagulants: Secondary | ICD-10-CM

## 2011-11-03 DIAGNOSIS — I5032 Chronic diastolic (congestive) heart failure: Secondary | ICD-10-CM

## 2011-11-03 DIAGNOSIS — I4891 Unspecified atrial fibrillation: Secondary | ICD-10-CM

## 2011-11-03 LAB — POCT INR: INR: 2.9

## 2011-11-03 NOTE — Assessment & Plan Note (Signed)
Patient's heart failure has been stable since the addition of p.r.n. Zaroxolyn was added. She is taking this one to 2 days a week and has been maintaining her weight. She is also being followed in the heart failure clinic in [place].

## 2011-11-03 NOTE — Assessment & Plan Note (Signed)
Blood pressure is up a little bit today. I will not make any additional changes at this time.

## 2011-11-03 NOTE — Patient Instructions (Signed)
Your physician recommends that you schedule a follow-up appointment in: 4 month follow up

## 2011-11-03 NOTE — Assessment & Plan Note (Signed)
Patient sugars have been running on the high side recently and she is scheduled to see an endocrinologist next month.
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
## 2011-11-10 ENCOUNTER — Ambulatory Visit: Payer: Self-pay | Admitting: Adult Health

## 2011-11-25 ENCOUNTER — Ambulatory Visit (HOSPITAL_COMMUNITY)
Admission: RE | Admit: 2011-11-25 | Discharge: 2011-11-25 | Disposition: A | Discharge status: Home / Self Care | Payer: Medicare Other | Source: Ambulatory Visit | Attending: Internal Medicine | Admitting: Internal Medicine

## 2011-11-25 ENCOUNTER — Encounter (HOSPITAL_COMMUNITY): Payer: Self-pay

## 2011-11-25 VITALS — BP 120/70 | HR 99 | Ht 67.0 in | Wt 249.4 lb

## 2011-11-25 DIAGNOSIS — I5032 Chronic diastolic (congestive) heart failure: Secondary | ICD-10-CM | POA: Insufficient documentation

## 2011-11-25 DIAGNOSIS — I4891 Unspecified atrial fibrillation: Secondary | ICD-10-CM | POA: Insufficient documentation

## 2011-11-25 DIAGNOSIS — R0989 Other specified symptoms and signs involving the circulatory and respiratory systems: Secondary | ICD-10-CM | POA: Insufficient documentation

## 2011-11-25 DIAGNOSIS — E119 Type 2 diabetes mellitus without complications: Secondary | ICD-10-CM | POA: Insufficient documentation

## 2011-11-25 DIAGNOSIS — D509 Iron deficiency anemia, unspecified: Secondary | ICD-10-CM | POA: Insufficient documentation

## 2011-11-25 DIAGNOSIS — E669 Obesity, unspecified: Secondary | ICD-10-CM | POA: Insufficient documentation

## 2011-11-25 DIAGNOSIS — R0609 Other forms of dyspnea: Secondary | ICD-10-CM | POA: Insufficient documentation

## 2011-11-25 DIAGNOSIS — I509 Heart failure, unspecified: Secondary | ICD-10-CM | POA: Insufficient documentation

## 2011-11-25 DIAGNOSIS — Z7901 Long term (current) use of anticoagulants: Secondary | ICD-10-CM | POA: Insufficient documentation

## 2011-11-25 DIAGNOSIS — M1990 Unspecified osteoarthritis, unspecified site: Secondary | ICD-10-CM | POA: Insufficient documentation

## 2011-11-25 DIAGNOSIS — H409 Unspecified glaucoma: Secondary | ICD-10-CM | POA: Insufficient documentation

## 2011-11-25 DIAGNOSIS — Z833 Family history of diabetes mellitus: Secondary | ICD-10-CM | POA: Insufficient documentation

## 2011-11-25 DIAGNOSIS — N189 Chronic kidney disease, unspecified: Secondary | ICD-10-CM | POA: Insufficient documentation

## 2011-11-25 DIAGNOSIS — Z809 Family history of malignant neoplasm, unspecified: Secondary | ICD-10-CM | POA: Insufficient documentation

## 2011-11-25 DIAGNOSIS — Z8249 Family history of ischemic heart disease and other diseases of the circulatory system: Secondary | ICD-10-CM | POA: Insufficient documentation

## 2011-11-25 DIAGNOSIS — Z8542 Personal history of malignant neoplasm of other parts of uterus: Secondary | ICD-10-CM | POA: Insufficient documentation

## 2011-11-25 DIAGNOSIS — I129 Hypertensive chronic kidney disease with stage 1 through stage 4 chronic kidney disease, or unspecified chronic kidney disease: Secondary | ICD-10-CM | POA: Insufficient documentation

## 2011-11-25 DIAGNOSIS — Z888 Allergy status to other drugs, medicaments and biological substances status: Secondary | ICD-10-CM | POA: Insufficient documentation

## 2011-11-25 NOTE — Assessment & Plan Note (Signed)
Volume status stable. She uses a sliding scale diuretics for weight 250 pounds or greater. Continue current diuretics. Reinforced daily weights and low alt food choices. Follow up in 3 months.
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
## 2011-11-25 NOTE — Patient Instructions (Addendum)
Follow up in 3 months   Do the following things EVERYDAY: 1) Weigh yourself in the morning before breakfast. Write it down and keep it in a log. 2) Take your medicines as prescribed 3) Eat low salt foods-Limit salt (sodium) to 2000 mg per day.  4) Stay as active as you can everyday 5) Limit all fluids for the day to less than 2 liters  

## 2011-11-25 NOTE — Assessment & Plan Note (Signed)
Continue current regimen. PT/INR followed [facility] in [place].

## 2011-11-30 NOTE — H&P (Signed)
  NTS SOAP Note  Vital Signs:  Vitals as of: 11/30/2011: Systolic 158: Diastolic 78: Heart Rate 90: Temp 98.71F: Height 86ft 7in: Weight 254Lbs 0 Ounces: BMI 40  BMI : 39.78 kg/m2  Subjective: This 66 Years 1 Months old Female presents for screening TCS.  Denies any GI complaints.  No family h/o colon cancer.   Review of Symptoms:  Constitutional:  fatigue,weakness Head:unremarkable    Eyes:  blurred vision bilateral Nose/Mouth/Throat:unremarkable Cardiovascular:  unremarkable   Respiratory:  dyspnea Gastrointestinal:  unremarkable   Genitourinary:    urinary hesitancy Musculoskeletal:unremarkable   Skin:unremarkable Hematolgic/Lymphatic:unremarkable     Allergic/Immunologic:unremarkable     Past Medical History:    Reviewed   Past Medical History  Surgical History: unremarkable Medical Problems:  Diabetes, Heart problems, High Blood pressure, atrial fib, CHF Allergies: nkda Medications: coumadin, calan, torsemide, klorcon, metolazone, metoprolol, lantus insulin, novolog   Social History:Reviewed  Social History  Preferred Language: English ([place]) Race:  White Ethnicity: Not Hispanic / Latino Age: 66 Years 1 Months Marital Status:  M Alcohol:  No Recreational drug(s):  No   Smoking Status: Never smoker reviewed on 11/30/2011  Family History:  Reviewed   Family History              Father:  Diabetes Type II             Child:  Cancer, but no colon cancer             Maternal Grandmother:  Diabetes Type II    Objective Information: General:  Well appearing, well nourished in no distress. Head:Atraumatic; no masses; no abnormalities Throat:  no erythema, exudates or lesions. Neck:  Supple without lymphadenopathy.  Heart:  RRR, no murmur Lungs:    CTA bilaterally, no wheezes, rhonchi, rales.  Breathing unlabored. Abdomen:Soft, NT/ND, no HSM, no masses.   deferred to  procedure  Assessment:Need for screening TCS  Diagnosis &amp; Procedure: DiagnosisCode: V76.51, ProcedureCode: 29562,    Plan:Scheduled for TCS on 01/04/12.   Patient Education:Alternative treatments to surgery were discussed with patient (and family).  Risks and benefits  of procedure were fully explained to the patient (and family) who gave informed consent. Patient/family questions were addressed.  Follow-up:Pending Surgery

## 2011-12-15 ENCOUNTER — Ambulatory Visit (INDEPENDENT_AMBULATORY_CARE_PROVIDER_SITE_OTHER): Payer: Medicare Other | Admitting: *Deleted

## 2011-12-15 DIAGNOSIS — I4891 Unspecified atrial fibrillation: Secondary | ICD-10-CM

## 2011-12-15 DIAGNOSIS — Z7901 Long term (current) use of anticoagulants: Secondary | ICD-10-CM

## 2011-12-15 LAB — POCT INR: INR: 3.4

## 2011-12-21 ENCOUNTER — Encounter (HOSPITAL_COMMUNITY): Payer: Self-pay | Admitting: Pharmacy Technician

## 2012-01-03 MED ORDER — SODIUM CHLORIDE 0.45 % IV SOLN
Freq: Once | INTRAVENOUS | Status: AC
  Administered 2012-01-04: 08:00:00 via INTRAVENOUS

## 2012-01-04 ENCOUNTER — Ambulatory Visit (HOSPITAL_COMMUNITY)
Admission: RE | Admit: 2012-01-04 | Discharge: 2012-01-04 | Disposition: A | Discharge status: Home / Self Care | Payer: Medicare Other | Source: Ambulatory Visit | Attending: General Surgery | Admitting: General Surgery

## 2012-01-04 ENCOUNTER — Encounter (HOSPITAL_COMMUNITY)
Admission: RE | Disposition: A | Discharge status: Home / Self Care | Payer: Self-pay | Source: Ambulatory Visit | Attending: General Surgery

## 2012-01-04 ENCOUNTER — Encounter (HOSPITAL_COMMUNITY): Payer: Self-pay | Admitting: *Deleted

## 2012-01-04 DIAGNOSIS — Z794 Long term (current) use of insulin: Secondary | ICD-10-CM | POA: Insufficient documentation

## 2012-01-04 DIAGNOSIS — Z01812 Encounter for preprocedural laboratory examination: Secondary | ICD-10-CM | POA: Insufficient documentation

## 2012-01-04 DIAGNOSIS — I4891 Unspecified atrial fibrillation: Secondary | ICD-10-CM | POA: Insufficient documentation

## 2012-01-04 DIAGNOSIS — Z79899 Other long term (current) drug therapy: Secondary | ICD-10-CM | POA: Insufficient documentation

## 2012-01-04 DIAGNOSIS — K5730 Diverticulosis of large intestine without perforation or abscess without bleeding: Secondary | ICD-10-CM | POA: Insufficient documentation

## 2012-01-04 DIAGNOSIS — Z7901 Long term (current) use of anticoagulants: Secondary | ICD-10-CM | POA: Insufficient documentation

## 2012-01-04 DIAGNOSIS — Z1211 Encounter for screening for malignant neoplasm of colon: Secondary | ICD-10-CM | POA: Insufficient documentation

## 2012-01-04 DIAGNOSIS — I10 Essential (primary) hypertension: Secondary | ICD-10-CM | POA: Insufficient documentation

## 2012-01-04 DIAGNOSIS — E119 Type 2 diabetes mellitus without complications: Secondary | ICD-10-CM | POA: Insufficient documentation

## 2012-01-04 HISTORY — DX: Major depressive disorder, single episode, unspecified: F32.9

## 2012-01-04 HISTORY — DX: Depression, unspecified: F32.A

## 2012-01-04 HISTORY — PX: COLONOSCOPY: SHX5424

## 2012-01-04 LAB — GLUCOSE, CAPILLARY
Glucose-Capillary: 176 mg/dL — ABNORMAL HIGH (ref 70–99)
Glucose-Capillary: 167 mg/dL — ABNORMAL HIGH (ref 70–99)

## 2012-01-04 SURGERY — COLONOSCOPY
Anesthesia: Moderate Sedation

## 2012-01-04 MED ORDER — MIDAZOLAM HCL 5 MG/5ML IJ SOLN
INTRAMUSCULAR | Status: DC | PRN
  Administered 2012-01-04: 1 mg via INTRAVENOUS
  Administered 2012-01-04: 3 mg via INTRAVENOUS
  Administered 2012-01-04: 1 mg via INTRAVENOUS

## 2012-01-04 MED ORDER — STERILE WATER FOR IRRIGATION IR SOLN
Status: DC | PRN
  Administered 2012-01-04: 09:00:00

## 2012-01-04 MED ORDER — MEPERIDINE HCL 25 MG/ML IJ SOLN
INTRAMUSCULAR | Status: DC | PRN
  Administered 2012-01-04: 50 mg via INTRAVENOUS
  Administered 2012-01-04: 25 mg via INTRAVENOUS

## 2012-01-04 MED ORDER — MEPERIDINE HCL 50 MG/ML IJ SOLN
INTRAMUSCULAR | Status: AC
  Filled 2012-01-04: qty 1

## 2012-01-04 MED ORDER — MIDAZOLAM HCL 5 MG/5ML IJ SOLN
INTRAMUSCULAR | Status: AC
  Filled 2012-01-04: qty 5

## 2012-01-04 NOTE — Op Note (Signed)
[facility] [address] [place], [place]  [address]  COLONOSCOPY PROCEDURE REPORT  PATIENT:  Krystal [name], Krystal [name]  MR#:  [id number] BIRTHDATE:  May 30, 1946, 66 yrs. old  GENDER:  female ENDOSCOPIST:  Franky Macho, MD REF. BY:  Assunta Found, M.D. PROCEDURE DATE:  01/04/2012 PROCEDURE:  Average-risk screening colonoscopy G0121 ASA CLASS:  Class II INDICATIONS:  Screening MEDICATIONS:   Versed 5 mg IV, demerol 75 mg IV  DESCRIPTION OF PROCEDURE:   After the risks benefits and alternatives of the procedure were thoroughly explained, informed consent was obtained.  Digital rectal exam was performed and revealed no abnormalities.   The EC-[id number] ([id number]) endoscope was introduced through the anus and advanced to the cecum, which was identified by both the appendix and ileocecal valve, without limitations.  The quality of the prep was adequate..  The instrument was then slowly withdrawn as the colon was fully examined.  FINDINGS:  Moderate diverticulosis was found in the descending adn sigmoid colon.   Retroflexed views in the rectum revealed no abnormalities.  The scope was then withdrawn from the cecum and the procedure completed. COMPLICATIONS:  None ENDOSCOPIC IMPRESSION: 1) Moderate diverticulosis in the descending colon 2) Normal colonoscopy otherwise RECOMMENDATIONS:  REPEAT EXAM:  In 10 year(s) for Colonoscopy.  ______________________________ Franky Macho, MD  CC:  Assunta Found, MD  n. Rosalie DoctorFranky Macho at 01/04/2012 08:57 AM  Wilson Singer, [phone]

## 2012-01-04 NOTE — Interval H&P Note (Signed)
History and Physical Interval Note:  01/04/2012 8:30 AM  Krystal [name]  has presented today for surgery, with the diagnosis of Special screening for malignant neoplasms, colon   The various methods of treatment have been discussed with the patient and family. After consideration of risks, benefits and other options for treatment, the patient has consented to  Procedure(s) (LRB): COLONOSCOPY (N/A) as a surgical intervention .  The patient's history has been reviewed, patient examined, no change in status, stable for surgery.  I have reviewed the patient's chart and labs.  Questions were answered to the patient's satisfaction.     Franky Macho A

## 2012-01-07 ENCOUNTER — Encounter (HOSPITAL_COMMUNITY): Payer: Self-pay | Admitting: General Surgery

## 2012-01-17 ENCOUNTER — Ambulatory Visit (INDEPENDENT_AMBULATORY_CARE_PROVIDER_SITE_OTHER): Payer: Medicare Other | Admitting: *Deleted

## 2012-01-17 DIAGNOSIS — I4891 Unspecified atrial fibrillation: Secondary | ICD-10-CM

## 2012-01-17 DIAGNOSIS — Z7901 Long term (current) use of anticoagulants: Secondary | ICD-10-CM

## 2012-01-24 ENCOUNTER — Other Ambulatory Visit (HOSPITAL_COMMUNITY): Payer: Self-pay | Admitting: Family Medicine

## 2012-01-24 DIAGNOSIS — F411 Generalized anxiety disorder: Secondary | ICD-10-CM

## 2012-01-27 ENCOUNTER — Ambulatory Visit (HOSPITAL_COMMUNITY)
Admission: RE | Admit: 2012-01-27 | Discharge: 2012-01-27 | Disposition: A | Discharge status: Home / Self Care | Payer: Medicare Other | Source: Ambulatory Visit | Attending: Family Medicine | Admitting: Family Medicine

## 2012-01-27 DIAGNOSIS — M899 Disorder of bone, unspecified: Secondary | ICD-10-CM | POA: Insufficient documentation

## 2012-01-27 DIAGNOSIS — F411 Generalized anxiety disorder: Secondary | ICD-10-CM

## 2012-02-04 ENCOUNTER — Other Ambulatory Visit: Payer: Self-pay | Admitting: *Deleted

## 2012-02-04 MED ORDER — WARFARIN SODIUM 5 MG PO TABS: 5.0000 mg | ORAL_TABLET | Freq: Every day | ORAL | Status: DC

## 2012-02-08 ENCOUNTER — Other Ambulatory Visit: Payer: Self-pay | Admitting: Cardiology

## 2012-02-08 MED ORDER — POTASSIUM CHLORIDE CRYS ER 20 MEQ PO TBCR: 40.0000 meq | EXTENDED_RELEASE_TABLET | Freq: Two times a day (BID) | ORAL | Status: DC

## 2012-02-16 ENCOUNTER — Ambulatory Visit (INDEPENDENT_AMBULATORY_CARE_PROVIDER_SITE_OTHER): Payer: Medicare Other | Admitting: *Deleted

## 2012-02-16 DIAGNOSIS — I4891 Unspecified atrial fibrillation: Secondary | ICD-10-CM

## 2012-02-16 DIAGNOSIS — Z7901 Long term (current) use of anticoagulants: Secondary | ICD-10-CM

## 2012-03-07 ENCOUNTER — Ambulatory Visit: Payer: BC Managed Care – PPO | Admitting: Cardiology

## 2012-03-15 ENCOUNTER — Encounter: Payer: Self-pay | Admitting: Physician Assistant

## 2012-03-15 ENCOUNTER — Other Ambulatory Visit: Payer: Self-pay | Admitting: *Deleted

## 2012-03-15 ENCOUNTER — Ambulatory Visit (INDEPENDENT_AMBULATORY_CARE_PROVIDER_SITE_OTHER): Payer: Medicare Other | Admitting: *Deleted

## 2012-03-15 ENCOUNTER — Ambulatory Visit (INDEPENDENT_AMBULATORY_CARE_PROVIDER_SITE_OTHER): Payer: Medicare Other | Admitting: Physician Assistant

## 2012-03-15 VITALS — BP 122/70 | HR 75 | Ht 67.0 in | Wt 259.5 lb

## 2012-03-15 DIAGNOSIS — I4891 Unspecified atrial fibrillation: Secondary | ICD-10-CM

## 2012-03-15 DIAGNOSIS — I10 Essential (primary) hypertension: Secondary | ICD-10-CM

## 2012-03-15 DIAGNOSIS — Z7901 Long term (current) use of anticoagulants: Secondary | ICD-10-CM

## 2012-03-15 DIAGNOSIS — I5032 Chronic diastolic (congestive) heart failure: Secondary | ICD-10-CM

## 2012-03-15 LAB — POCT INR: INR: 3.9

## 2012-03-15 MED ORDER — METOPROLOL TARTRATE 25 MG PO TABS: 25.0000 mg | ORAL_TABLET | Freq: Two times a day (BID) | ORAL | Status: DC

## 2012-03-15 MED ORDER — POTASSIUM CHLORIDE CRYS ER 20 MEQ PO TBCR: 40.0000 meq | EXTENDED_RELEASE_TABLET | Freq: Two times a day (BID) | ORAL | Status: DC

## 2012-03-15 MED ORDER — TORSEMIDE 20 MG PO TABS: 40.0000 mg | ORAL_TABLET | Freq: Every day | ORAL | Status: DC

## 2012-03-15 MED ORDER — VERAPAMIL HCL ER 180 MG PO TBCR: 180.0000 mg | EXTENDED_RELEASE_TABLET | Freq: Every day | ORAL | Status: DC

## 2012-03-15 MED ORDER — ATORVASTATIN CALCIUM 20 MG PO TABS: 20.0000 mg | ORAL_TABLET | Freq: Every day | ORAL | Status: DC

## 2012-03-15 NOTE — Assessment & Plan Note (Signed)
Controlled.  

## 2012-03-15 NOTE — Assessment & Plan Note (Addendum)
Patient's heart failure is controlled. She is up a couple pounds and will take an extra Zaroxolyn in the morning. There is no evidence of heart failure on exam today.She is followed by the heart failure clinic. She can see Dr. Molly Maduro back in 6 months.
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
## 2012-03-15 NOTE — Patient Instructions (Signed)
Your physician wants you to follow-up in: 6 months with Dr. Rothbart. You will receive a reminder letter in the mail two months in advance. If you don't receive a letter, please call our office to schedule the follow-up appointment.  

## 2012-03-22 ENCOUNTER — Telehealth: Payer: Self-pay | Admitting: Physician Assistant

## 2012-03-22 NOTE — Telephone Encounter (Signed)
[employer] mail representative advised that this patient has been on these two medications for quite some time and that the practitioners are aware, however, I would make note of it and send it to you for review.

## 2012-03-22 NOTE — Telephone Encounter (Signed)
METOPROLOL AND VERAPIMIL ARE INTERACTION AND THEY NEED TO KNOW IF WE WOULD LIKE TO CONTINUE WITH BOTH MEDICATIONS FOR THIS PATIENT.

## 2012-03-23 NOTE — Telephone Encounter (Signed)
Yes we are aware. 

## 2012-04-05 ENCOUNTER — Ambulatory Visit (INDEPENDENT_AMBULATORY_CARE_PROVIDER_SITE_OTHER): Payer: Medicare Other | Admitting: *Deleted

## 2012-04-05 DIAGNOSIS — Z7901 Long term (current) use of anticoagulants: Secondary | ICD-10-CM

## 2012-04-05 DIAGNOSIS — I4891 Unspecified atrial fibrillation: Secondary | ICD-10-CM

## 2012-04-05 LAB — POCT INR: INR: 2.2

## 2012-04-20 ENCOUNTER — Other Ambulatory Visit: Payer: Self-pay | Admitting: Cardiology

## 2012-04-20 MED ORDER — WARFARIN SODIUM 5 MG PO TABS: 5.0000 mg | ORAL_TABLET | Freq: Every day | ORAL | Status: DC

## 2012-05-03 ENCOUNTER — Ambulatory Visit (INDEPENDENT_AMBULATORY_CARE_PROVIDER_SITE_OTHER): Payer: Medicare Other | Admitting: *Deleted

## 2012-05-03 DIAGNOSIS — I4891 Unspecified atrial fibrillation: Secondary | ICD-10-CM

## 2012-05-03 DIAGNOSIS — Z7901 Long term (current) use of anticoagulants: Secondary | ICD-10-CM

## 2012-05-03 LAB — POCT INR: INR: 2.5

## 2012-05-31 ENCOUNTER — Ambulatory Visit (INDEPENDENT_AMBULATORY_CARE_PROVIDER_SITE_OTHER): Payer: Medicare Other | Admitting: *Deleted

## 2012-05-31 DIAGNOSIS — I4891 Unspecified atrial fibrillation: Secondary | ICD-10-CM

## 2012-05-31 DIAGNOSIS — Z7901 Long term (current) use of anticoagulants: Secondary | ICD-10-CM

## 2012-05-31 LAB — POCT INR: INR: 3.1

## 2012-07-10 ENCOUNTER — Ambulatory Visit (INDEPENDENT_AMBULATORY_CARE_PROVIDER_SITE_OTHER): Payer: Medicare Other | Admitting: *Deleted

## 2012-07-10 DIAGNOSIS — Z7901 Long term (current) use of anticoagulants: Secondary | ICD-10-CM

## 2012-07-10 DIAGNOSIS — I4891 Unspecified atrial fibrillation: Secondary | ICD-10-CM

## 2012-07-10 LAB — POCT INR: INR: 4.1

## 2012-07-24 ENCOUNTER — Ambulatory Visit (INDEPENDENT_AMBULATORY_CARE_PROVIDER_SITE_OTHER): Payer: Medicare Other | Admitting: *Deleted

## 2012-07-24 DIAGNOSIS — I4891 Unspecified atrial fibrillation: Secondary | ICD-10-CM

## 2012-07-24 DIAGNOSIS — Z7901 Long term (current) use of anticoagulants: Secondary | ICD-10-CM
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
## 2012-08-24 ENCOUNTER — Ambulatory Visit (INDEPENDENT_AMBULATORY_CARE_PROVIDER_SITE_OTHER): Payer: Medicare Other | Admitting: *Deleted

## 2012-08-24 ENCOUNTER — Encounter: Payer: Self-pay | Admitting: Adult Health

## 2012-08-24 ENCOUNTER — Ambulatory Visit (INDEPENDENT_AMBULATORY_CARE_PROVIDER_SITE_OTHER): Payer: Medicare Other | Admitting: Adult Health

## 2012-08-24 VITALS — BP 124/77 | HR 72 | Ht 67.0 in | Wt 285.0 lb

## 2012-08-24 DIAGNOSIS — I5033 Acute on chronic diastolic (congestive) heart failure: Secondary | ICD-10-CM

## 2012-08-24 DIAGNOSIS — I5032 Chronic diastolic (congestive) heart failure: Secondary | ICD-10-CM

## 2012-08-24 DIAGNOSIS — I509 Heart failure, unspecified: Secondary | ICD-10-CM

## 2012-08-24 DIAGNOSIS — Z7901 Long term (current) use of anticoagulants: Secondary | ICD-10-CM

## 2012-08-24 DIAGNOSIS — I4891 Unspecified atrial fibrillation: Secondary | ICD-10-CM

## 2012-08-24 DIAGNOSIS — I10 Essential (primary) hypertension: Secondary | ICD-10-CM

## 2012-08-24 MED ORDER — WARFARIN SODIUM 5 MG PO TABS: 5.0000 mg | ORAL_TABLET | Freq: Every day | ORAL | Status: DC

## 2012-08-24 MED ORDER — ATORVASTATIN CALCIUM 20 MG PO TABS: 20.0000 mg | ORAL_TABLET | Freq: Every day | ORAL | Status: DC

## 2012-08-24 MED ORDER — TORSEMIDE 20 MG PO TABS: 40.0000 mg | ORAL_TABLET | Freq: Every day | ORAL | Status: DC

## 2012-08-24 MED ORDER — METOLAZONE 2.5 MG PO TABS: 2.5000 mg | ORAL_TABLET | Freq: Every day | ORAL | Status: DC | PRN

## 2012-08-24 MED ORDER — VERAPAMIL HCL ER 180 MG PO TBCR: 180.0000 mg | EXTENDED_RELEASE_TABLET | Freq: Every day | ORAL | Status: DC

## 2012-08-24 MED ORDER — METOPROLOL TARTRATE 25 MG PO TABS: 25.0000 mg | ORAL_TABLET | Freq: Two times a day (BID) | ORAL | Status: DC

## 2012-08-24 NOTE — Assessment & Plan Note (Signed)
Review of recent weights and trends demonstrate that she has gained 25 pounds since October of 2013, with an overall 40 pound weight gain since last year. She admits to ) noncompliance, has not been weighing herself daily in order to evaluate the need to take a dose of metolazone for 5 pound weight gain. She has been having some complaints of PND and dyspnea on exertion she is also morbidly obese. I am seeing some evidence of fluid retention in the lower extremities with 2+ to 1+ pretibial edema more so in her feet. She does have some mild distention of her abdomen. I decided to give her 2 doses of metolazone over the next 2 days with continued torsemide dose as she has been taking. She will return to her regular regimen thereafter. She is to begin weighing herself daily and avoiding salt again. She admits she has been very sedentary over the winter and has not been as active as she normally is. Now that the weather is better I have encouraged her to become more active again. We will see her again in 2 weeks she will have a BMET in 5 days.

## 2012-08-24 NOTE — Patient Instructions (Addendum)
Take Metolazone 2.5 mg 30 min before am dose of torsemide Friday and Saturday ONLY  Lab work [facility] on Monday 08/28/12   Weigh Daily  Call office Monday 3/17 to report how you are doing   Your physician recommends that you schedule a follow-up appointment in: 2 weeks same day as appointment for INR

## 2012-08-24 NOTE — Assessment & Plan Note (Signed)
Excellent control of blood pressure. We will continue to monitor closely after she had some diuresis using metolazone. To avoid dehydration or near syncope.

## 2012-08-24 NOTE — Assessment & Plan Note (Signed)
Review of EKG demonstrates atrial flutter with controlled rate of 72 beats per minute. Will not make any changes in her medications at this time. She will continue taking Xarelto as directed.

## 2012-08-29 ENCOUNTER — Telehealth: Payer: Self-pay | Admitting: Adult Health

## 2012-08-29 NOTE — Telephone Encounter (Signed)
Noted. Thanks.

## 2012-08-29 NOTE — Telephone Encounter (Signed)
Please note advise 

## 2012-08-29 NOTE — Telephone Encounter (Signed)
Patient states that she has lost 15lbs, is breathing better and legs are not cramping after taking 2 "fluid pills". / tgs

## 2012-09-02 ENCOUNTER — Inpatient Hospital Stay (HOSPITAL_COMMUNITY)
Admission: EM | Admit: 2012-09-02 | Discharge: 2012-09-08 | DRG: 871 | Disposition: A | Discharge status: Home Health | Payer: Medicare Other | Attending: Internal Medicine | Admitting: Internal Medicine

## 2012-09-02 ENCOUNTER — Encounter (HOSPITAL_COMMUNITY): Payer: Self-pay

## 2012-09-02 ENCOUNTER — Emergency Department (HOSPITAL_COMMUNITY): Payer: Medicare Other

## 2012-09-02 DIAGNOSIS — F329 Major depressive disorder, single episode, unspecified: Secondary | ICD-10-CM | POA: Diagnosis present

## 2012-09-02 DIAGNOSIS — Z8249 Family history of ischemic heart disease and other diseases of the circulatory system: Secondary | ICD-10-CM

## 2012-09-02 DIAGNOSIS — Z8544 Personal history of malignant neoplasm of other female genital organs: Secondary | ICD-10-CM

## 2012-09-02 DIAGNOSIS — Z809 Family history of malignant neoplasm, unspecified: Secondary | ICD-10-CM

## 2012-09-02 DIAGNOSIS — I10 Essential (primary) hypertension: Secondary | ICD-10-CM

## 2012-09-02 DIAGNOSIS — Z6841 Body Mass Index (BMI) 40.0 and over, adult: Secondary | ICD-10-CM

## 2012-09-02 DIAGNOSIS — R0602 Shortness of breath: Secondary | ICD-10-CM

## 2012-09-02 DIAGNOSIS — R188 Other ascites: Secondary | ICD-10-CM | POA: Diagnosis not present

## 2012-09-02 DIAGNOSIS — I5033 Acute on chronic diastolic (congestive) heart failure: Secondary | ICD-10-CM

## 2012-09-02 DIAGNOSIS — K760 Fatty (change of) liver, not elsewhere classified: Secondary | ICD-10-CM

## 2012-09-02 DIAGNOSIS — R197 Diarrhea, unspecified: Secondary | ICD-10-CM | POA: Diagnosis present

## 2012-09-02 DIAGNOSIS — I110 Hypertensive heart disease with heart failure: Secondary | ICD-10-CM

## 2012-09-02 DIAGNOSIS — E1121 Type 2 diabetes mellitus with diabetic nephropathy: Secondary | ICD-10-CM | POA: Diagnosis present

## 2012-09-02 DIAGNOSIS — I129 Hypertensive chronic kidney disease with stage 1 through stage 4 chronic kidney disease, or unspecified chronic kidney disease: Secondary | ICD-10-CM | POA: Diagnosis present

## 2012-09-02 DIAGNOSIS — I4891 Unspecified atrial fibrillation: Secondary | ICD-10-CM

## 2012-09-02 DIAGNOSIS — E119 Type 2 diabetes mellitus without complications: Secondary | ICD-10-CM

## 2012-09-02 DIAGNOSIS — N184 Chronic kidney disease, stage 4 (severe): Secondary | ICD-10-CM

## 2012-09-02 DIAGNOSIS — R55 Syncope and collapse: Secondary | ICD-10-CM

## 2012-09-02 DIAGNOSIS — F3289 Other specified depressive episodes: Secondary | ICD-10-CM | POA: Diagnosis present

## 2012-09-02 DIAGNOSIS — I5032 Chronic diastolic (congestive) heart failure: Secondary | ICD-10-CM

## 2012-09-02 DIAGNOSIS — I509 Heart failure, unspecified: Secondary | ICD-10-CM | POA: Diagnosis present

## 2012-09-02 DIAGNOSIS — R5383 Other fatigue: Secondary | ICD-10-CM

## 2012-09-02 DIAGNOSIS — H1132 Conjunctival hemorrhage, left eye: Secondary | ICD-10-CM

## 2012-09-02 DIAGNOSIS — E860 Dehydration: Secondary | ICD-10-CM

## 2012-09-02 DIAGNOSIS — K7689 Other specified diseases of liver: Secondary | ICD-10-CM | POA: Diagnosis present

## 2012-09-02 DIAGNOSIS — Z90710 Acquired absence of both cervix and uterus: Secondary | ICD-10-CM

## 2012-09-02 DIAGNOSIS — D509 Iron deficiency anemia, unspecified: Secondary | ICD-10-CM

## 2012-09-02 DIAGNOSIS — E669 Obesity, unspecified: Secondary | ICD-10-CM

## 2012-09-02 DIAGNOSIS — M129 Arthropathy, unspecified: Secondary | ICD-10-CM | POA: Diagnosis present

## 2012-09-02 DIAGNOSIS — I50810 Right heart failure, unspecified: Secondary | ICD-10-CM

## 2012-09-02 DIAGNOSIS — A419 Sepsis, unspecified organism: Principal | ICD-10-CM

## 2012-09-02 DIAGNOSIS — N390 Urinary tract infection, site not specified: Secondary | ICD-10-CM

## 2012-09-02 DIAGNOSIS — R531 Weakness: Secondary | ICD-10-CM

## 2012-09-02 DIAGNOSIS — E871 Hypo-osmolality and hyponatremia: Secondary | ICD-10-CM

## 2012-09-02 DIAGNOSIS — N170 Acute kidney failure with tubular necrosis: Secondary | ICD-10-CM | POA: Diagnosis present

## 2012-09-02 DIAGNOSIS — Z7901 Long term (current) use of anticoagulants: Secondary | ICD-10-CM

## 2012-09-02 DIAGNOSIS — E876 Hypokalemia: Secondary | ICD-10-CM

## 2012-09-02 DIAGNOSIS — D696 Thrombocytopenia, unspecified: Secondary | ICD-10-CM

## 2012-09-02 DIAGNOSIS — N179 Acute kidney failure, unspecified: Secondary | ICD-10-CM

## 2012-09-02 LAB — COMPREHENSIVE METABOLIC PANEL
ALT: 6 U/L (ref 0–35)
AST: 31 U/L (ref 0–37)
Albumin: 3.6 g/dL (ref 3.5–5.2)
Alkaline Phosphatase: 67 U/L (ref 39–117)
CO2: 27 mEq/L (ref 19–32)
Chloride: 89 mEq/L — ABNORMAL LOW (ref 96–112)
Creatinine, Ser: 1.84 mg/dL — ABNORMAL HIGH (ref 0.50–1.10)
GFR calc non Af Amer: 28 mL/min — ABNORMAL LOW (ref >90)
Potassium: 2.9 mEq/L — ABNORMAL LOW (ref 3.5–5.1)
Sodium: 133 mEq/L — ABNORMAL LOW (ref 135–145)
Total Bilirubin: 2.3 mg/dL — ABNORMAL HIGH (ref 0.3–1.2)

## 2012-09-02 LAB — URINALYSIS, ROUTINE W REFLEX MICROSCOPIC
Bilirubin Urine: NEGATIVE
Glucose, UA: NEGATIVE mg/dL
Protein, ur: 100 mg/dL — AB
Urobilinogen, UA: 0.2 mg/dL (ref 0.0–1.0)

## 2012-09-02 LAB — HEMOGLOBIN A1C
Hgb A1c MFr Bld: 6.3 % — ABNORMAL HIGH (ref <5.7)
Mean Plasma Glucose: 134 mg/dL — ABNORMAL HIGH (ref <117)

## 2012-09-02 LAB — URINE MICROSCOPIC-ADD ON

## 2012-09-02 LAB — TROPONIN I: Troponin I: <0.3 ng/mL (ref <0.30)

## 2012-09-02 LAB — PROTIME-INR: Prothrombin Time: 20.4 seconds — ABNORMAL HIGH (ref 11.6–15.2)

## 2012-09-02 LAB — CBC WITH DIFFERENTIAL/PLATELET
Basophils Absolute: 0 10*3/uL (ref 0.0–0.1)
Basophils Relative: 0 % (ref 0–1)
HCT: 34.8 % — ABNORMAL LOW (ref 36.0–46.0)
Lymphocytes Relative: 4 % — ABNORMAL LOW (ref 12–46)
MCHC: 31.6 g/dL (ref 30.0–36.0)
Monocytes Absolute: 1.7 10*3/uL — ABNORMAL HIGH (ref 0.1–1.0)
Neutro Abs: 15.8 10*3/uL — ABNORMAL HIGH (ref 1.7–7.7)
Neutrophils Relative %: 86 % — ABNORMAL HIGH (ref 43–77)
Platelets: 133 10*3/uL — ABNORMAL LOW (ref 150–400)
RDW: 15.9 % — ABNORMAL HIGH (ref 11.5–15.5)
WBC: 18.3 10*3/uL — ABNORMAL HIGH (ref 4.0–10.5)

## 2012-09-02 LAB — GLUCOSE, CAPILLARY: Glucose-Capillary: 207 mg/dL — ABNORMAL HIGH (ref 70–99)

## 2012-09-02 MED ORDER — ONDANSETRON HCL 4 MG/2ML IJ SOLN: 4.0000 mg | Freq: Four times a day (QID) | INTRAMUSCULAR | Status: DC | PRN

## 2012-09-02 MED ORDER — WARFARIN - PHYSICIAN DOSING INPATIENT
Freq: Every day | Status: DC
  Administered 2012-09-03: 18:00:00

## 2012-09-02 MED ORDER — CITALOPRAM HYDROBROMIDE 20 MG PO TABS
20.0000 mg | ORAL_TABLET | Freq: Every day | ORAL | Status: DC
  Administered 2012-09-02 – 2012-09-07 (×6): 20 mg via ORAL
  Filled 2012-09-02 (×6): qty 1

## 2012-09-02 MED ORDER — POTASSIUM CHLORIDE 10 MEQ/100ML IV SOLN
10.0000 meq | Freq: Once | INTRAVENOUS | Status: AC
  Administered 2012-09-02: 10 meq via INTRAVENOUS
  Filled 2012-09-02: qty 100

## 2012-09-02 MED ORDER — INSULIN GLARGINE 100 UNIT/ML ~~LOC~~ SOLN
10.0000 [IU] | Freq: Every day | SUBCUTANEOUS | Status: DC
  Administered 2012-09-02: 10 [IU] via SUBCUTANEOUS
  Filled 2012-09-02 (×2): qty 0.1

## 2012-09-02 MED ORDER — METOPROLOL TARTRATE 25 MG PO TABS
25.0000 mg | ORAL_TABLET | Freq: Two times a day (BID) | ORAL | Status: DC
  Administered 2012-09-02 – 2012-09-03 (×2): 25 mg via ORAL
  Filled 2012-09-02 (×2): qty 1

## 2012-09-02 MED ORDER — INSULIN ASPART 100 UNIT/ML ~~LOC~~ SOLN: 0.0000 [IU] | Freq: Every day | SUBCUTANEOUS | Status: DC

## 2012-09-02 MED ORDER — DEXTROSE 5 % IV SOLN
1.0000 g | Freq: Once | INTRAVENOUS | Status: AC
  Administered 2012-09-02: 1 g via INTRAVENOUS
  Filled 2012-09-02: qty 10

## 2012-09-02 MED ORDER — SODIUM CHLORIDE 0.9 % IV BOLUS (SEPSIS)
250.0000 mL | Freq: Once | INTRAVENOUS | Status: AC
  Administered 2012-09-02: 250 mL via INTRAVENOUS

## 2012-09-02 MED ORDER — DEXTROSE 5 % IV SOLN
1.0000 g | INTRAVENOUS | Status: DC
  Filled 2012-09-02 (×2): qty 10

## 2012-09-02 MED ORDER — ONDANSETRON HCL 4 MG PO TABS: 4.0000 mg | ORAL_TABLET | Freq: Four times a day (QID) | ORAL | Status: DC | PRN

## 2012-09-02 MED ORDER — WARFARIN SODIUM 5 MG PO TABS: 5.0000 mg | ORAL_TABLET | Freq: Every day | ORAL | Status: DC

## 2012-09-02 MED ORDER — VERAPAMIL HCL ER 180 MG PO TBCR
180.0000 mg | EXTENDED_RELEASE_TABLET | Freq: Every day | ORAL | Status: DC
  Administered 2012-09-02: 180 mg via ORAL
  Filled 2012-09-02: qty 1

## 2012-09-02 MED ORDER — ACETAMINOPHEN 500 MG PO TABS
500.0000 mg | ORAL_TABLET | Freq: Once | ORAL | Status: AC
  Administered 2012-09-02: 500 mg via ORAL
  Filled 2012-09-02: qty 1

## 2012-09-02 MED ORDER — POTASSIUM CHLORIDE IN NACL 40-0.9 MEQ/L-% IV SOLN
INTRAVENOUS | Status: DC
  Administered 2012-09-02: 19:00:00 via INTRAVENOUS

## 2012-09-02 MED ORDER — WARFARIN SODIUM 5 MG PO TABS: 5.0000 mg | ORAL_TABLET | ORAL | Status: DC

## 2012-09-02 MED ORDER — INSULIN ASPART 100 UNIT/ML ~~LOC~~ SOLN
5.0000 [IU] | Freq: Three times a day (TID) | SUBCUTANEOUS | Status: DC
  Administered 2012-09-03: 5 [IU] via SUBCUTANEOUS

## 2012-09-02 MED ORDER — ALPRAZOLAM 0.25 MG PO TABS
0.2500 mg | ORAL_TABLET | Freq: Every day | ORAL | Status: DC
  Administered 2012-09-02 – 2012-09-07 (×6): 0.25 mg via ORAL
  Filled 2012-09-02 (×6): qty 1

## 2012-09-02 MED ORDER — POTASSIUM CHLORIDE 10 MEQ/100ML IV SOLN
10.0000 meq | INTRAVENOUS | Status: AC
  Administered 2012-09-02 (×3): 10 meq via INTRAVENOUS
  Filled 2012-09-02: qty 100
  Filled 2012-09-02: qty 200

## 2012-09-02 MED ORDER — SODIUM CHLORIDE 0.9 % IJ SOLN
3.0000 mL | Freq: Two times a day (BID) | INTRAMUSCULAR | Status: DC
  Administered 2012-09-07: 3 mL via INTRAVENOUS
  Filled 2012-09-02: qty 3

## 2012-09-02 MED ORDER — WARFARIN SODIUM 10 MG PO TABS
10.0000 mg | ORAL_TABLET | ORAL | Status: DC
  Administered 2012-09-02: 10 mg via ORAL
  Filled 2012-09-02: qty 1

## 2012-09-02 MED ORDER — INSULIN ASPART 100 UNIT/ML ~~LOC~~ SOLN
0.0000 [IU] | Freq: Three times a day (TID) | SUBCUTANEOUS | Status: DC
  Administered 2012-09-02 – 2012-09-03 (×3): 5 [IU] via SUBCUTANEOUS
  Administered 2012-09-04 – 2012-09-08 (×6): 2 [IU] via SUBCUTANEOUS

## 2012-09-02 MED ORDER — HYDROMORPHONE HCL PF 1 MG/ML IJ SOLN: 0.5000 mg | INTRAMUSCULAR | Status: DC | PRN

## 2012-09-02 MED ORDER — DEXTROSE 5 % IV SOLN: 1.0000 g | Freq: Once | INTRAVENOUS | Status: DC

## 2012-09-02 MED ORDER — INSULIN GLARGINE 100 UNIT/ML ~~LOC~~ SOLN
SUBCUTANEOUS | Status: AC
  Filled 2012-09-02: qty 10

## 2012-09-02 MED ORDER — ONDANSETRON HCL 4 MG/2ML IJ SOLN
4.0000 mg | Freq: Once | INTRAMUSCULAR | Status: AC
  Administered 2012-09-02: 4 mg via INTRAVENOUS
  Filled 2012-09-02: qty 2

## 2012-09-02 NOTE — ED Provider Notes (Signed)
Medical screening examination/treatment/procedure(s) were conducted as a shared visit with non-physician practitioner(s) and myself.  I personally evaluated the patient during the encounter  Patient seen by me. Patient clinically dry. Patient with hypokalemia sounds as if new medication may have dried her out too much blood she had the nausea and vomiting and diarrhea since Wednesday has now resolved except for the nausea. Patient would benefit from continued IV fluid hydration which will have to be gentle due to her renal insufficiency and some correction in the potassium. Also appears that she has a urinary tract infection that may be contributing to her symptoms. Has initiated antibiotics here. We'll call for admission with the hospitalist.    Shelda Jakes, MD 09/02/12 [phone]
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
## 2012-09-02 NOTE — ED Notes (Signed)
Pt arrived from home by ems, she reports nausea, vomiting and diarrhea since Wednesday.  No diarrhea or vomiting since Thursday. Cont. To feel weak. +nausea.

## 2012-09-02 NOTE — ED Notes (Signed)
Has been resting in bed, voices no complaints, no further nausea, vomiting or diarrhea.

## 2012-09-02 NOTE — ED Notes (Signed)
Py had episode where she became very weak in bed, repositioned pt in bed, and staff checked her blood sugar. Was 211, pt given graham cracker and diet coke. Tolerated both well.

## 2012-09-02 NOTE — ED Notes (Signed)
Pt reports having nausea, vomiting and diarrhea that started on Wednesday, she last vomited and diarrhea on Thursday. Cont. To have nausea and weakness. Arrived from home by ems today.  She is alert, and asking for something to drink. Advised unable to have anything to drink at this time. Nodded in understanding.
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
## 2012-09-02 NOTE — ED Notes (Signed)
CBG taken with a measurement of 207.

## 2012-09-02 NOTE — ED Notes (Signed)
Pts husband talked about leaving the building for a little bit. If the pt gets admitted and he is not here, he can be reached at [phone]. His name is Trey Paula.

## 2012-09-03 DIAGNOSIS — N179 Acute kidney failure, unspecified: Secondary | ICD-10-CM

## 2012-09-03 DIAGNOSIS — A419 Sepsis, unspecified organism: Secondary | ICD-10-CM | POA: Diagnosis present

## 2012-09-03 DIAGNOSIS — I10 Essential (primary) hypertension: Secondary | ICD-10-CM

## 2012-09-03 LAB — PROTIME-INR: Prothrombin Time: 25.5 seconds — ABNORMAL HIGH (ref 11.6–15.2)

## 2012-09-03 LAB — COMPREHENSIVE METABOLIC PANEL
Albumin: 3.1 g/dL — ABNORMAL LOW (ref 3.5–5.2)
Alkaline Phosphatase: 63 U/L (ref 39–117)
BUN: 47 mg/dL — ABNORMAL HIGH (ref 6–23)
Chloride: 88 mEq/L — ABNORMAL LOW (ref 96–112)
Glucose, Bld: 261 mg/dL — ABNORMAL HIGH (ref 70–99)
Potassium: 3.3 mEq/L — ABNORMAL LOW (ref 3.5–5.1)
Total Bilirubin: 2.3 mg/dL — ABNORMAL HIGH (ref 0.3–1.2)

## 2012-09-03 LAB — CBC WITH DIFFERENTIAL/PLATELET
Basophils Relative: 0 % (ref 0–1)
Hemoglobin: 11 g/dL — ABNORMAL LOW (ref 12.0–15.0)
Lymphs Abs: 0.8 10*3/uL (ref 0.7–4.0)
Monocytes Relative: 9 % (ref 3–12)
Neutro Abs: 18.8 10*3/uL — ABNORMAL HIGH (ref 1.7–7.7)
Neutrophils Relative %: 87 % — ABNORMAL HIGH (ref 43–77)
RBC: 3.96 MIL/uL (ref 3.87–5.11)

## 2012-09-03 LAB — GLUCOSE, CAPILLARY
Glucose-Capillary: 217 mg/dL — ABNORMAL HIGH (ref 70–99)
Glucose-Capillary: 215 mg/dL — ABNORMAL HIGH (ref 70–99)
Glucose-Capillary: 112 mg/dL — ABNORMAL HIGH (ref 70–99)
Glucose-Capillary: 113 mg/dL — ABNORMAL HIGH (ref 70–99)

## 2012-09-03 MED ORDER — SODIUM CHLORIDE 0.9 % IV BOLUS (SEPSIS)
1000.0000 mL | Freq: Once | INTRAVENOUS | Status: AC
  Administered 2012-09-03: 1000 mL via INTRAVENOUS

## 2012-09-03 MED ORDER — WARFARIN SODIUM 2 MG PO TABS
3.0000 mg | ORAL_TABLET | Freq: Every day | ORAL | Status: DC
  Administered 2012-09-03: 3 mg via ORAL
  Filled 2012-09-03: qty 1

## 2012-09-03 MED ORDER — SODIUM CHLORIDE 0.9 % IV SOLN
INTRAVENOUS | Status: DC
  Administered 2012-09-03 – 2012-09-06 (×6): via INTRAVENOUS

## 2012-09-03 MED ORDER — INSULIN GLARGINE 100 UNIT/ML ~~LOC~~ SOLN
15.0000 [IU] | Freq: Every day | SUBCUTANEOUS | Status: DC
  Administered 2012-09-03 – 2012-09-07 (×3): 15 [IU] via SUBCUTANEOUS
  Filled 2012-09-03 (×8): qty 0.15

## 2012-09-03 MED ORDER — SODIUM CHLORIDE 0.9 % IV BOLUS (SEPSIS)
500.0000 mL | Freq: Once | INTRAVENOUS | Status: AC
  Administered 2012-09-03: 500 mL via INTRAVENOUS

## 2012-09-03 MED ORDER — PIPERACILLIN-TAZOBACTAM 3.375 G IVPB
3.3750 g | Freq: Three times a day (TID) | INTRAVENOUS | Status: DC
  Administered 2012-09-03 – 2012-09-05 (×7): 3.375 g via INTRAVENOUS
  Filled 2012-09-03 (×10): qty 50

## 2012-09-03 MED ORDER — INSULIN ASPART 100 UNIT/ML ~~LOC~~ SOLN
8.0000 [IU] | Freq: Three times a day (TID) | SUBCUTANEOUS | Status: DC
  Administered 2012-09-03 – 2012-09-08 (×12): 8 [IU] via SUBCUTANEOUS

## 2012-09-03 MED ORDER — ACETAMINOPHEN 500 MG PO TABS
500.0000 mg | ORAL_TABLET | Freq: Four times a day (QID) | ORAL | Status: DC | PRN
  Administered 2012-09-03 – 2012-09-05 (×3): 500 mg via ORAL
  Filled 2012-09-03 (×2): qty 1

## 2012-09-03 NOTE — ED Provider Notes (Signed)
Medical screening examination/treatment/procedure(s) were conducted as a shared visit with non-physician practitioner(s) and myself.  I personally evaluated the patient during the encounter   Shelda Jakes, MD 09/03/12 [phone]

## 2012-09-03 NOTE — Progress Notes (Signed)
D- Pt has a temperature of 102.9 at 1950.  Pt's reports feeling weak and hot due to fever.  Her skin is hot to touch and her heart rate is tachy.  A- Dr. Orvan Falconer notified via text page and further discussion was done when he came on the unit to see another patient.  Pt has tylenol listed as an allergy due to some liver problems and Dr. Orvan Falconer ordered a cooling blanket.  Once cooling blanket was applied with rectal temperature probe to closely monitor her temperature it was 104.5 at 2137.  Dr. Orvan Falconer notified via text page and he called and gave a one time order for Tylenol 500mg  if the patient was agreeable to take it because the low dosage would not cause further liver problems.  Pt agreed to take the tylenol and it was given per order at 2245.  Her temperature came down to 101.8 and heart rate is 90 at 0000.  Her temperature was 99.4 at 0100 and has remained below 101 with cooling blanket in use since 0100.  R-Pt now in bed and reports feeling tired due to lack of sleep but states she feels better.  Temperature is now 99.4 rectal with cooling blanket monitoring.  Nursing staff to continue to monitor.

## 2012-09-03 NOTE — Progress Notes (Signed)
Pt has only voided 100cc during shift. Assisted pt the restroom to attempt to void and pt unable to void and denies the urge. Bladder scan done and 200cc of urine noted on scanner. MD paged to make aware.

## 2012-09-03 NOTE — Progress Notes (Signed)
Overnight, patient had fever to 104.5. Received cooling blanket  Subjective: Feels better today. Nausea improved. Still soft but not watery. No abdominal pain. No flank pain. Tolerating a clear liquid diet. No shortness of breath.  Objective: Vital signs in last 24 hours: Filed Vitals:   09/03/12 0100 09/03/12 0224 09/03/12 0415 09/03/12 0600  BP:  133/68 126/77   Pulse:  65 70   Temp: 99.4 F (37.4 C) 99.8 F (37.7 C) 100.3 F (37.9 C) 99.4 F (37.4 C)  TempSrc: Rectal Rectal Oral Rectal  Resp:  20 20   Height:      Weight:   127.2 kg (280 lb 6.8 oz)   SpO2:  96% 94%    Weight change:   Intake/Output Summary (Last 24 hours) at 09/03/12 0826 Last data filed at 09/03/12 0600  Gross per 24 hour  Intake    896 ml  Output      0 ml  Net    896 ml   General: Brighter today. Eating breakfast. Oriented and nontoxic appearing. Lungs clear to auscultation bilaterally without wheeze rhonchi or rales Cardiovascular irregularly irregular Abdomen obese soft nontender Extremities no pitting edema. Warm.  Lab Results: Basic Metabolic Panel:  Recent Labs Lab 09/02/12 1013 09/03/12 0605  NA 133* 129*  K 2.9* 3.3*  CL 89* 88*  CO2 27 25  GLUCOSE 221* 261*  BUN 37* 47*  CREATININE 1.84* 2.71*  CALCIUM 9.5 8.9  MG 2.2  --    Lactic acid level yesterday was 2.3.  Liver Function Tests:  Recent Labs Lab 09/02/12 1013 09/03/12 0605  AST 31 98*  ALT 6 21  ALKPHOS 67 63  BILITOT 2.3* 2.3*  PROT 7.5 6.7  ALBUMIN 3.6 3.1*   No results found for this basename: LIPASE, AMYLASE,  in the last 168 hours No results found for this basename: AMMONIA,  in the last 168 hours CBC:  Recent Labs Lab 09/02/12 1013 09/03/12 0605  WBC 18.3* 21.6*  NEUTROABS 15.8* 18.8*  HGB 11.0* 11.0*  HCT 34.8* 35.3*  MCV 89.7 89.1  PLT 133* 127*   Cardiac Enzymes:  Recent Labs Lab 09/02/12 1013  TROPONINI <0.30   BNP: No results found for this basename: PROBNP,  in the last 168  hours D-Dimer: No results found for this basename: DDIMER,  in the last 168 hours CBG:  Recent Labs Lab 09/02/12 0916 09/02/12 1420 09/02/12 1823 09/02/12 2132 09/03/12 0747  GLUCAP 207* 211* 216* 196* 217*   Hemoglobin A1C:  Recent Labs Lab 09/02/12 1013  HGBA1C 6.3*   Fasting Lipid Panel: No results found for this basename: CHOL, HDL, LDLCALC, TRIG, CHOLHDL, LDLDIRECT,  in the last 168 hours Thyroid Function Tests: No results found for this basename: TSH, T4TOTAL, FREET4, T3FREE, THYROIDAB,  in the last 168 hours Coagulation:  Recent Labs Lab 09/02/12 1013 09/03/12 0605  LABPROT 20.4* 25.5*  INR 1.82* 2.46*   Anemia Panel: No results found for this basename: VITAMINB12, FOLATE, FERRITIN, TIBC, IRON, RETICCTPCT,  in the last 168 hours Urinalysis:  Recent Labs Lab 09/02/12 1158  COLORURINE YELLOW  LABSPEC 1.020  PHURINE 6.0  GLUCOSEU NEGATIVE  HGBUR LARGE*  BILIRUBINUR NEGATIVE  KETONESUR NEGATIVE  PROTEINUR 100*  UROBILINOGEN 0.2  NITRITE NEGATIVE  LEUKOCYTESUR NEGATIVE   Micro Results: No results found for this or any previous visit (from the past 240 hour(s)). Studies/Results: Dg Chest Portable 1 View  09/02/2012  *RADIOLOGY REPORT*  Clinical Data: Nausea, vomiting, diarrhea  PORTABLE CHEST -  1 VIEW  Comparison: 07/25/2011  Findings: Study is limited by patient's large body habitus and poor inspiration.  Cardiomegaly again noted.  Central vascular congestion without convincing pulmonary edema.  Bilateral basilar atelectasis or infiltrate.  IMPRESSION: Limited study as described above.  Central vascular congestion without convincing pulmonary edema.  Bilateral basilar atelectasis or infiltrate.  Cardiomegaly again noted.   Original Report Authenticated By: Natasha Mead, M.D.    Scheduled Meds: . ALPRAZolam  0.25 mg Oral QHS  . citalopram  20 mg Oral QHS  . insulin aspart  0-15 Units Subcutaneous TID WC  . insulin aspart  0-5 Units Subcutaneous QHS  .  insulin aspart  5 Units Subcutaneous TID WC  . insulin glargine  10 Units Subcutaneous QHS  . metoprolol tartrate  25 mg Oral BID  . piperacillin-tazobactam (ZOSYN)  IV  3.375 g Intravenous Q8H  . sodium chloride  3 mL Intravenous Q12H  . verapamil  180 mg Oral QHS  . warfarin  3 mg Oral q1800  . Warfarin - Physician Dosing Inpatient   Does not apply q1800   Continuous Infusions: . 0.9 % NaCl with KCl 40 mEq / L 100 mL/hr at 09/03/12 0723   PRN Meds:.HYDROmorphone (DILAUDID) injection, ondansetron (ZOFRAN) IV, ondansetron Assessment/Plan: Principal Problem:   Sepsis Active Problems:   UTI (urinary tract infection)   Acute renal failure   DIABETES MELLITUS, TYPE II   Atrial fibrillation   HTN (hypertension)   Chronic diastolic heart failure   Hypokalemia   Weakness   CKD (chronic kidney disease), stage IV   OBESITY   Thrombocytopenia   Nonalcoholic fatty liver disease  Will get blood cultures. Broaden antibiotic coverage. Stop Rocephin. Start Zosyn. Potassium improved but still slightly low. With worsening renal function, will need to be cautious with repletion. Increase NovoLog with meals and increase Lantus. Change warfarin to 3 mg daily. No evidence of acute heart failure. Acute renal failure likely related to sepsis/prerenal/possibly ATN. We will increase IV fluids. Diuretics have been held since admission. Urine output has been adequate. Watch for fluid overload.   LOS: 1 day   Dakai Braithwaite L 09/03/2012, 8:26 AM

## 2012-09-04 ENCOUNTER — Inpatient Hospital Stay (HOSPITAL_COMMUNITY): Payer: Medicare Other

## 2012-09-04 LAB — CLOSTRIDIUM DIFFICILE BY PCR: Toxigenic C. Difficile by PCR: NEGATIVE

## 2012-09-04 LAB — CBC WITH DIFFERENTIAL/PLATELET
Basophils Absolute: 0 10*3/uL (ref 0.0–0.1)
Basophils Relative: 0 % (ref 0–1)
Eosinophils Absolute: 0.1 10*3/uL (ref 0.0–0.7)
Eosinophils Relative: 1 % (ref 0–5)
HCT: 31.4 % — ABNORMAL LOW (ref 36.0–46.0)
MCH: 28.7 pg (ref 26.0–34.0)
MCHC: 32.5 g/dL (ref 30.0–36.0)
MCV: 88.2 fL (ref 78.0–100.0)
Monocytes Absolute: 1.4 10*3/uL — ABNORMAL HIGH (ref 0.1–1.0)
Platelets: 89 10*3/uL — ABNORMAL LOW (ref 150–400)
RDW: 16.2 % — ABNORMAL HIGH (ref 11.5–15.5)
WBC: 14.3 10*3/uL — ABNORMAL HIGH (ref 4.0–10.5)

## 2012-09-04 LAB — BASIC METABOLIC PANEL
BUN: 56 mg/dL — ABNORMAL HIGH (ref 6–23)
CO2: 25 mEq/L (ref 19–32)
Calcium: 8.6 mg/dL (ref 8.4–10.5)
Creatinine, Ser: 2.97 mg/dL — ABNORMAL HIGH (ref 0.50–1.10)

## 2012-09-04 LAB — URINE CULTURE

## 2012-09-04 LAB — GLUCOSE, CAPILLARY

## 2012-09-04 MED ORDER — POTASSIUM CHLORIDE 10 MEQ/100ML IV SOLN
INTRAVENOUS | Status: AC
  Administered 2012-09-04: 10 meq via INTRAVENOUS
  Filled 2012-09-04: qty 100

## 2012-09-04 MED ORDER — METRONIDAZOLE IN NACL 5-0.79 MG/ML-% IV SOLN
500.0000 mg | Freq: Three times a day (TID) | INTRAVENOUS | Status: DC
  Administered 2012-09-04 – 2012-09-05 (×4): 500 mg via INTRAVENOUS
  Filled 2012-09-04 (×7): qty 100

## 2012-09-04 MED ORDER — SODIUM CHLORIDE 0.9 % IV BOLUS (SEPSIS)
250.0000 mL | Freq: Once | INTRAVENOUS | Status: AC
  Administered 2012-09-04: 250 mL via INTRAVENOUS

## 2012-09-04 MED ORDER — VANCOMYCIN 50 MG/ML ORAL SOLUTION
125.0000 mg | Freq: Four times a day (QID) | ORAL | Status: DC
  Administered 2012-09-04: 125 mg via ORAL
  Filled 2012-09-04 (×9): qty 2.5

## 2012-09-04 MED ORDER — POTASSIUM CHLORIDE 10 MEQ/100ML IV SOLN
10.0000 meq | INTRAVENOUS | Status: AC
  Administered 2012-09-04: 10 meq via INTRAVENOUS
  Filled 2012-09-04: qty 100

## 2012-09-04 NOTE — Care Management Note (Signed)
    Page 1 of 2   09/08/2012     1:19:21 PM   CARE MANAGEMENT NOTE 09/08/2012  Patient:  Krystal [name], Krystal [name]   Account Number:  [id number]  Date Initiated:  09/04/2012  Documentation initiated by:  Rosemary Holms  Subjective/Objective Assessment:   Pt admitted from home where she lives with her spouse. Admitted with UTI/sepsis. Husband at bedside and very concerned about her condition.     Action/Plan:   Anticipated DC Date:  09/08/2012   Anticipated DC Plan:  HOME W HOME HEALTH SERVICES      DC Planning Services  CM consult      Choice offered to / List presented to:     DME arranged  WALKER - Lavone Nian      DME agency  [facility]     [facility] arranged  HH-1 RN  HH-10 DISEASE MANAGEMENT  HH-2 PT      [facility] agency  [facility]   Status of service:  Completed, signed off Medicare Important Message given?  YES (If response is "NO", the following Medicare IM given date fields will be blank) Date Medicare IM given:  09/07/2012 Date Additional Medicare IM given:    Discharge Disposition:    Per UR Regulation:    If discussed at Long Length of Stay Meetings, dates discussed:   09/07/2012    Comments:  09/08/12 Rosemary Holms RN BSN CM [facility] set up and walker delivered to room.  09/07/12 Arthella Headings RN BSN CM Husband somewhat anxious about upcoming DC and pt's readiness. IM signed by husband and copy left at bedside. Requests [facility] for HH/DME  09/05/12 Rosemary Holms RN BSN CM PT recommended Outpt PT. Discussed with spouse who is ok with this and has no transportation problems. Sp also states she already has a walker from previous admission. Pt asleep.  09/04/12 Rosemary Holms RN BSN CM

## 2012-09-04 NOTE — Clinical Documentation Improvement (Signed)
BMI DOCUMENTATION CLARIFICATION QUERY  THIS DOCUMENT IS NOT A PERMANENT PART OF THE MEDICAL RECORD  TO RESPOND TO THE THIS QUERY, FOLLOW THE INSTRUCTIONS BELOW:  1. If needed, update documentation for the patient's encounter via the notes activity.  2. Access this query again and click edit on the In [employer].  3. After updating, or not, click F2 to complete all highlighted (required) fields concerning your review. Select "additional documentation in the medical record" OR "no additional documentation provided".  4. Click Sign note button.  5. The deficiency will fall out of your In Basket *Please let us know if you are not able to complete this workflow by phone or e-mail (listed below).         09/04/12  Dear Dr. Lendell Caprice Marton Redwood  In an effort to better capture your patient's severity of illness, reflect appropriate length of stay and utilization of resources, a review of the patient medical record has revealed the following indicators.    Based on your clinical judgment, please clarify and document in a progress note and/or discharge summary the clinical condition associated with the following supporting information:  In responding to this query please exercise your independent judgment.  The fact that a query is asked, does not imply that any particular answer is desired or expected.  Possible Clinical conditions  Morbid Obesity W/ BMI= 44.91  Other condition___________________  Cannot Clinically determine _____________  Clinical Information:  Risk Factors: Chronic diastolic CHF CKD stage IV Chronic illnesses  Signs & Symptoms: Weight: 289 lbs  Height 5'7"  BMI = 44.91  Body index table BMI 26-33=Overweight BMI 33-40=Obese BMI >40= Morbid obesity  Treatment Carb modified diet 1600-2000 calories Daily weights  Reviewed: additional documentation in the medical record  Thank You,  Debora T Williams RN, MSN Clinical Documentation  Specialist: Office# [phone]  [facility] Health Information Management [place]

## 2012-09-04 NOTE — Progress Notes (Signed)
Overnight, spiked a fever again. Low urine output yesterday, received a 500 cc bolus, and metoprolol was held.  Subjective: "Had a rough night".  Febrile.  Diarrhea returned.  About 4 loose stools.  No abdominal pain. No bleeding.  No shortness of breath.  Objective: Vital signs in last 24 hours: Filed Vitals:   09/04/12 0000 09/04/12 0120 09/04/12 0212 09/04/12 0429  BP:   106/54 102/63  Pulse:   73 89  Temp: 102.2 F (39 C) 100.7 F (38.2 C) 99.2 F (37.3 C) 99.4 F (37.4 C)  TempSrc: Rectal Rectal Oral Rectal  Resp:   20 20  Height:      Weight:    130.1 kg (286 lb 13.1 oz)  SpO2:   91% 96%   Weight change: 2.9 kg (6 lb 6.3 oz)  Intake/Output Summary (Last 24 hours) at 09/04/12 0833 Last data filed at 09/04/12 0600  Gross per 24 hour  Intake   2590 ml  Output    150 ml  Net   2440 ml   General: weak appearing.  Eating a small amount of solid breakfast. Lungs clear to auscultation bilaterally without wheeze rhonchi or rales Cardiovascular irregularly irregular Abdomen obese soft nontender Extremities no pitting edema. Warm.  Lab Results: Basic Metabolic Panel:  Recent Labs Lab 09/02/12 1013 09/03/12 0605 09/04/12 0740  NA 133* 129* 128*  K 2.9* 3.3* 3.1*  CL 89* 88* 88*  CO2 27 25 25   GLUCOSE 221* 261* 97  BUN 37* 47* 56*  CREATININE 1.84* 2.71* 2.97*  CALCIUM 9.5 8.9 8.6  MG 2.2  --   --    Lactic acid level yesterday was 2.3.  Liver Function Tests:  Recent Labs Lab 09/02/12 1013 09/03/12 0605  AST 31 98*  ALT 6 21  ALKPHOS 67 63  BILITOT 2.3* 2.3*  PROT 7.5 6.7  ALBUMIN 3.6 3.1*   No results found for this basename: LIPASE, AMYLASE,  in the last 168 hours No results found for this basename: AMMONIA,  in the last 168 hours CBC:  Recent Labs Lab 09/03/12 0605 09/04/12 0740  WBC 21.6* 14.3*  NEUTROABS 18.8* 11.6*  HGB 11.0* 10.2*  HCT 35.3* 31.4*  MCV 89.1 88.2  PLT 127* 89*   Cardiac Enzymes:  Recent Labs Lab 09/02/12 1013   TROPONINI <0.30   BNP: No results found for this basename: PROBNP,  in the last 168 hours D-Dimer: No results found for this basename: DDIMER,  in the last 168 hours CBG:  Recent Labs Lab 09/02/12 1823 09/02/12 2132 09/03/12 0747 09/03/12 1149 09/03/12 1627 09/03/12 2113  GLUCAP 216* 196* 217* 215* 112* 113*   Hemoglobin A1C:  Recent Labs Lab 09/02/12 1013  HGBA1C 6.3*   Fasting Lipid Panel: No results found for this basename: CHOL, HDL, LDLCALC, TRIG, CHOLHDL, LDLDIRECT,  in the last 168 hours Thyroid Function Tests: No results found for this basename: TSH, T4TOTAL, FREET4, T3FREE, THYROIDAB,  in the last 168 hours Coagulation:  Recent Labs Lab 09/02/12 1013 09/03/12 0605 09/04/12 0500  LABPROT 20.4* 25.5* 32.3*  INR 1.82* 2.46* 3.38*   Anemia Panel: No results found for this basename: VITAMINB12, FOLATE, FERRITIN, TIBC, IRON, RETICCTPCT,  in the last 168 hours Urinalysis:  Recent Labs Lab 09/02/12 1158  COLORURINE YELLOW  LABSPEC 1.020  PHURINE 6.0  GLUCOSEU NEGATIVE  HGBUR LARGE*  BILIRUBINUR NEGATIVE  KETONESUR NEGATIVE  PROTEINUR 100*  UROBILINOGEN 0.2  NITRITE NEGATIVE  LEUKOCYTESUR NEGATIVE   Micro Results: Recent Results (from  the past 240 hour(s))  URINE CULTURE     Status: None   Collection Time    09/02/12 11:58 AM      Result Value Range Status   Specimen Description URINE, CLEAN CATCH   Final   Special Requests NONE   Final   Culture  Setup Time 09/02/2012 21:51   Final   Colony Count 40,000 COLONIES/ML   Final   Culture     Final   Value: Multiple bacterial morphotypes present, none predominant. Suggest appropriate recollection if clinically indicated.   Report Status 09/04/2012 FINAL   Final  CULTURE, BLOOD (ROUTINE X 2)     Status: None   Collection Time    09/03/12  8:50 AM      Result Value Range Status   Specimen Description BLOOD LEFT ARM   Final   Special Requests BOTTLES DRAWN AEROBIC AND ANAEROBIC  4  CC  EACH    Final   Culture PENDING   Incomplete   Report Status PENDING   Incomplete  CULTURE, BLOOD (ROUTINE X 2)     Status: None   Collection Time    09/03/12  8:55 AM      Result Value Range Status   Specimen Description BLOOD RIGHT ARM   Final   Special Requests BOTTLES DRAWN AEROBIC AND ANAEROBIC  6 CC  EACH   Final   Culture PENDING   Incomplete   Report Status PENDING   Incomplete   Studies/Results: Dg Chest Portable 1 View  09/02/2012  *RADIOLOGY REPORT*  Clinical Data: Nausea, vomiting, diarrhea  PORTABLE CHEST - 1 VIEW  Comparison: 07/25/2011  Findings: Study is limited by patient's large body habitus and poor inspiration.  Cardiomegaly again noted.  Central vascular congestion without convincing pulmonary edema.  Bilateral basilar atelectasis or infiltrate.  IMPRESSION: Limited study as described above.  Central vascular congestion without convincing pulmonary edema.  Bilateral basilar atelectasis or infiltrate.  Cardiomegaly again noted.   Original Report Authenticated By: Natasha Mead, M.D.    Scheduled Meds: . ALPRAZolam  0.25 mg Oral QHS  . citalopram  20 mg Oral QHS  . insulin aspart  0-15 Units Subcutaneous TID WC  . insulin aspart  0-5 Units Subcutaneous QHS  . insulin aspart  8 Units Subcutaneous TID WC  . insulin glargine  15 Units Subcutaneous QHS  . piperacillin-tazobactam (ZOSYN)  IV  3.375 g Intravenous Q8H  . sodium chloride  3 mL Intravenous Q12H  . verapamil  180 mg Oral QHS  . Warfarin - Physician Dosing Inpatient   Does not apply q1800   Continuous Infusions: . sodium chloride 100 mL/hr at 09/04/12 0140  . 0.9 % NaCl with KCl 40 mEq / L 100 mL/hr at 09/03/12 0723   PRN Meds:.acetaminophen, HYDROmorphone (DILAUDID) injection, ondansetron (ZOFRAN) IV, ondansetron Assessment/Plan: Principal Problem:   Sepsis: Will give another fluid bolus to increase urine output. Continue maintenance IV at current. White blood cell count has improved, but patient continues to be  intermittently febrile. Active Problems:   UTI (urinary tract infection), culture showing no organism predominant. Continue Zosyn for now. Narrow antibiotic spectrum when able. Diarrhea: Will check for C. difficile PCR. Also check GI pathogen panel. Will treat empirically with oral vancomycin and IV Flagyl until results back.   Acute renal failure: Secondary to above. Needs fluid bolus. Will also check renal ultrasound to rule out hydronephrosis or perinephric abscess. Check fractional excretion of sodium   DIABETES MELLITUS, TYPE II: Better control  Atrial fibrillation: Rate controlled. Coumadin supratherapeutic. Will hold until INR below 3.   HTN (hypertension)   Chronic diastolic heart failure: No evidence of pulmonary edema clinically.   Hypokalemia: Replete IV.   Weakness   CKD (chronic kidney disease), stage IV   OBESITY   Thrombocytopenia, chronic   Nonalcoholic fatty liver disease  Updated patient's husband.   LOS: 2 days   Nyrie Sigal L 09/04/2012, 8:33 AM

## 2012-09-04 NOTE — Progress Notes (Signed)
UR Chart Review Completed  

## 2012-09-05 DIAGNOSIS — I5033 Acute on chronic diastolic (congestive) heart failure: Secondary | ICD-10-CM

## 2012-09-05 DIAGNOSIS — I509 Heart failure, unspecified: Secondary | ICD-10-CM

## 2012-09-05 LAB — PROTIME-INR: INR: 3.34 — ABNORMAL HIGH (ref 0.00–1.49)

## 2012-09-05 LAB — COMPREHENSIVE METABOLIC PANEL
ALT: 25 U/L (ref 0–35)
AST: 86 U/L — ABNORMAL HIGH (ref 0–37)
CO2: 23 mEq/L (ref 19–32)
Chloride: 96 mEq/L (ref 96–112)
Creatinine, Ser: 2.49 mg/dL — ABNORMAL HIGH (ref 0.50–1.10)
GFR calc non Af Amer: 19 mL/min — ABNORMAL LOW (ref >90)
Sodium: 132 mEq/L — ABNORMAL LOW (ref 135–145)
Total Bilirubin: 1.5 mg/dL — ABNORMAL HIGH (ref 0.3–1.2)

## 2012-09-05 LAB — GLUCOSE, CAPILLARY

## 2012-09-05 LAB — CBC WITH DIFFERENTIAL/PLATELET
Basophils Absolute: 0 10*3/uL (ref 0.0–0.1)
HCT: 31.4 % — ABNORMAL LOW (ref 36.0–46.0)
Lymphocytes Relative: 9 % — ABNORMAL LOW (ref 12–46)
Monocytes Absolute: 1 10*3/uL (ref 0.1–1.0)
Neutro Abs: 7.7 10*3/uL (ref 1.7–7.7)
RDW: 16.4 % — ABNORMAL HIGH (ref 11.5–15.5)
WBC: 9.8 10*3/uL (ref 4.0–10.5)

## 2012-09-05 MED ORDER — POTASSIUM CHLORIDE 10 MEQ/100ML IV SOLN
10.0000 meq | INTRAVENOUS | Status: AC
  Administered 2012-09-05 (×4): 10 meq via INTRAVENOUS
  Filled 2012-09-05 (×4): qty 100

## 2012-09-05 MED ORDER — DEXTROSE 5 % IV SOLN
1.0000 g | INTRAVENOUS | Status: DC
  Administered 2012-09-05 – 2012-09-06 (×2): 1 g via INTRAVENOUS
  Filled 2012-09-05 (×3): qty 10

## 2012-09-05 MED ORDER — METRONIDAZOLE 500 MG PO TABS
250.0000 mg | ORAL_TABLET | Freq: Three times a day (TID) | ORAL | Status: DC
  Administered 2012-09-05 – 2012-09-07 (×7): 250 mg via ORAL
  Filled 2012-09-05 (×7): qty 1

## 2012-09-05 MED ORDER — METOPROLOL TARTRATE 25 MG PO TABS
12.5000 mg | ORAL_TABLET | Freq: Two times a day (BID) | ORAL | Status: DC
  Administered 2012-09-05 – 2012-09-08 (×7): 12.5 mg via ORAL
  Filled 2012-09-05 (×7): qty 1

## 2012-09-05 NOTE — Progress Notes (Addendum)
[facility] PROGRESS NOTE  ALLEE BUSK QMV:[id number] DOB: April 30, 1946 DOA: 09/02/2012 PCP: Colette Ribas, MD  Assessment/Plan: Sepsis:  Improving slowly.  Continue maintenance IV at current rate as BP still somewhat soft. Improved urine output. White blood cell count now within normal limits. Max temp 100.2 rectal over last 8 hours.   Active Problems:  UTI (urinary tract infection), culture showing no organism predominant. Zosyn day #3.   Diarrhea: C. difficile PCR negative. GI pathogen panel in process. Oral vancomycin discontinued 3/24. Will continue IV Flagyl until results back. P reports stool becoming more formed but frequency unchanged.   Acute renal failure: Secondary to above. Improving. Creatinine trending downward. Urine output improved. Renal ultrasound yields normal appearance of both kidneys without hydronephrosis. Ascites. Sodium trending up today.   DIABETES MELLITUS, TYPE II: Better control CBG range 91-131. Will continue lantus and Novolog meal coverage and SSI. A1c 6.9  Atrial fibrillation: Remains rate controlled. HR range 86-100. INR 3.34. Will continue to hold until INR below 3.   HTN (hypertension) : SBP range 97-112. Continue IV fluids. Continue to hold lopressor. Monitor closely  Chronic diastolic heart failure: Remains stable. No evidence of pulmonary edema clinically.   Hypokalemia: Likely related to diarrhea. Will replete and recheck.    Weakness : improving. Able to position self on side of bed this am. Will request PT eval for tomorrow. Up to chair today.   CKD (chronic kidney disease), stage IV : creatinine trending down. Urine output improved.   OBESITY : BME 42.4. Nutritional consult   Thrombocytopenia, chronic : tending up today  Nonalcoholic fatty liver disease   Code Status: full Family Communication: husband at bedside Disposition Plan: home when ready.    Consultants:  none  Procedures:   Antibiotics:  Zosyn  09/03/12>>>  Flagyl 09/04/12>>  Rocephin 09/02/12-09/03/12  HPI/Subjective: Reports feeling better. Reports stool more formed, able to eat last evening. Able to get to [facility] with 1 assist.   Objective: Filed Vitals:   09/05/12 0100 09/05/12 0200 09/05/12 0600 09/05/12 0617  BP:    105/54  Pulse:    86  Temp: 100.2 F (37.9 C) 99.7 F (37.6 C) 97.3 F (36.3 C) 97.5 F (36.4 C)  TempSrc: Rectal Rectal Rectal   Resp:    20  Height:      Weight:    135 kg (297 lb 9.9 oz)  SpO2:    97%    Intake/Output Summary (Last 24 hours) at 09/05/12 1008 Last data filed at 09/05/12 0600  Gross per 24 hour  Intake   3850 ml  Output   1900 ml  Net   1950 ml   Filed Weights   09/03/12 0415 09/04/12 0429 09/05/12 0617  Weight: 127.2 kg (280 lb 6.8 oz) 130.1 kg (286 lb 13.1 oz) 135 kg (297 lb 9.9 oz)    Exam:   General:  Obese, alert NAD  Cardiovascular: irregularly irregular No MGR trace LE edema  Respiratory: normal effort BS clear to auscultation bilaterally no wheeze/rhonchi  Abdomen: obese soft +BS non-tender to palpation  Musculoskeletal: moves all extremities. No clubbing or cyanosis   Data Reviewed: Basic Metabolic Panel:  Recent Labs Lab 09/02/12 1013 09/03/12 0605 09/04/12 0740 09/05/12 0441  NA 133* 129* 128* 132*  K 2.9* 3.3* 3.1* 2.9*  CL 89* 88* 88* 96  CO2 27 25 25 23   GLUCOSE 221* 261* 97 90  BUN 37* 47* 56* 54*  CREATININE 1.84* 2.71* 2.97* 2.49*  CALCIUM 9.5  8.9 8.6 8.7  MG 2.2  --   --   --    Liver Function Tests:  Recent Labs Lab 09/02/12 1013 09/03/12 0605 09/05/12 0441  AST 31 98* 86*  ALT 6 21 25   ALKPHOS 67 63 76  BILITOT 2.3* 2.3* 1.5*  PROT 7.5 6.7 6.2  ALBUMIN 3.6 3.1* 2.6*   No results found for this basename: LIPASE, AMYLASE,  in the last 168 hours No results found for this basename: AMMONIA,  in the last 168 hours CBC:  Recent Labs Lab 09/02/12 1013 09/03/12 0605 09/04/12 0740 09/05/12 0441  WBC 18.3* 21.6* 14.3* 9.8   NEUTROABS 15.8* 18.8* 11.6* 7.7  HGB 11.0* 11.0* 10.2* 10.1*  HCT 34.8* 35.3* 31.4* 31.4*  MCV 89.7 89.1 88.2 87.5  PLT 133* 127* 89* 100*   Cardiac Enzymes:  Recent Labs Lab 09/02/12 1013  TROPONINI <0.30   BNP (last 3 results) No results found for this basename: PROBNP,  in the last 8760 hours CBG:  Recent Labs Lab 09/04/12 0815 09/04/12 1137 09/04/12 1627 09/04/12 2016 09/05/12 0736  GLUCAP 106* 123* 131* 110* 91    Recent Results (from the past 240 hour(s))  URINE CULTURE     Status: None   Collection Time    09/02/12 11:58 AM      Result Value Range Status   Specimen Description URINE, CLEAN CATCH   Final   Special Requests NONE   Final   Culture  Setup Time 09/02/2012 21:51   Final   Colony Count 40,000 COLONIES/ML   Final   Culture     Final   Value: Multiple bacterial morphotypes present, none predominant. Suggest appropriate recollection if clinically indicated.   Report Status 09/04/2012 FINAL   Final  CULTURE, BLOOD (ROUTINE X 2)     Status: None   Collection Time    09/03/12  8:50 AM      Result Value Range Status   Specimen Description BLOOD LEFT ARM   Final   Special Requests BOTTLES DRAWN AEROBIC AND ANAEROBIC  4  CC  EACH   Final   Culture NO GROWTH 1 DAY   Final   Report Status PENDING   Incomplete  CULTURE, BLOOD (ROUTINE X 2)     Status: None   Collection Time    09/03/12  8:55 AM      Result Value Range Status   Specimen Description BLOOD RIGHT ARM   Final   Special Requests BOTTLES DRAWN AEROBIC AND ANAEROBIC  6 CC  EACH   Final   Culture NO GROWTH 1 DAY   Final   Report Status PENDING   Incomplete  CLOSTRIDIUM DIFFICILE BY PCR     Status: None   Collection Time    09/04/12  9:38 AM      Result Value Range Status   C difficile by pcr NEGATIVE  NEGATIVE Final     Studies: US Renal  09/04/2012  *RADIOLOGY REPORT*  Clinical Data: Sepsis and acute renal failure.  History of chronic kidney disease.  RENAL/URINARY TRACT ULTRASOUND  COMPLETE  Comparison:  06/15/2011  Findings:  Right Kidney:  The right kidney measures 12.2 cm in length without hydronephrosis.  Renal echogenicity is within normal limits.  Left Kidney:  Left kidney measures 12.9 cm in length without hydronephrosis.  Normal left renal echogenicity.  Bladder:  Normal appearance of the urinary bladder.  Other findings:  There is perihepatic ascites.  IMPRESSION: Normal appearance of both kidneys without hydronephrosis.  Ascites.   Original Report Authenticated By: Richarda Overlie, M.D.     Scheduled Meds: . ALPRAZolam  0.25 mg Oral QHS  . citalopram  20 mg Oral QHS  . insulin aspart  0-15 Units Subcutaneous TID WC  . insulin aspart  0-5 Units Subcutaneous QHS  . insulin aspart  8 Units Subcutaneous TID WC  . insulin glargine  15 Units Subcutaneous QHS  . metronidazole  500 mg Intravenous Q8H  . piperacillin-tazobactam (ZOSYN)  IV  3.375 g Intravenous Q8H  . sodium chloride  3 mL Intravenous Q12H  . Warfarin - Physician Dosing Inpatient   Does not apply q1800   Continuous Infusions: . sodium chloride 100 mL/hr at 09/05/12 0521   Time spent: 35 minutes  [facility] M  [facility]  If 7PM-7AM, please contact night-coverage at [email or web address], password [facility] 09/05/2012, 10:08 AM  LOS: 3 days   Attending note Patient interviewed and it in the am and independently. Urine output, white blood cell count, temperature curve, creatinine all starting to improve. Will continue Flagyl, but changed to by mouth. She is no longer having nausea. Will narrow Zosyn, as patient has shown improvement. Will switch back to ceftriaxone. Urine cultures showed no specific organism. Will resume metoprolol at a lower dose. Will need to watch closely for fluid overload. Patient's weight is up but likely third spacing. Her lungs are clear and she denies shortness of breath. Diuretics have been held. Await physical therapy recommendations. Coumadin held for supratherapeutic levels.    Crista Curb, M.D.

## 2012-09-05 NOTE — Evaluation (Addendum)
Physical Therapy Evaluation Patient Details Name: Krystal [name] MRN: [id number] DOB: 1945/11/19 Today's Date: 09/05/2012 Time:1355  - 1438    PT Assessment / Plan / Recommendation Clinical Impression  Pt seen for initial evaluation.  Pt has decreased over the past several weeks in activity tolerance and strength.  She now requires a walker to be safe to ambulate.  Pt will benefit from skilled PT to improve strength and endurance to return pt to previous functional level    PT Assessment  Patient needs continued PT services    Follow Up Recommendations  Outpatient PT    Does the patient have the potential to tolerate intense rehabilitation    no  Barriers to Discharge Inaccessible home environment      Equipment Recommendations  Rolling walker with 5" wheels    Recommendations for Other Services OT consult   Frequency Min 5X/week    Precautions / Restrictions Precautions Precautions: Fall Restrictions Weight Bearing Restrictions: No   Pertinent Vitals/Pain 0      Mobility  Bed Mobility Bed Mobility: Rolling Left;Rolling Right;Right Sidelying to Sit Rolling Right: 6: Modified independent (Device/Increase time);With rail Rolling Left: 6: Modified independent (Device/Increase time);With rail Right Sidelying to Sit: 4: Min assist Transfers Transfers: Sit to Stand Sit to Stand: 5: Supervision Ambulation/Gait Ambulation/Gait Assistance: 5: Supervision Ambulation Distance (Feet): 24 Feet Assistive device: Rolling walker Gait Pattern: Decreased step length - left;Decreased step length - right Gait velocity: slow    Exercises General Exercises - Lower Extremity Gluteal Sets:  (bridging x 10; pilates 40(ab curl with UE pulsing)) Long Arc Quad: AROM;Strengthening;Both;10 reps;Seated Heel Slides: AROM;Strengthening;Both;10 reps;Supine Hip ABduction/ADduction: AROM;Strengthening;Both;10 reps;Supine Straight Leg Raises: AROM;Strengthening;Both;5 reps;Supine Hip  Flexion/Marching: AROM;Strengthening;Both;10 reps;Standing   PT Diagnosis: Difficulty walking;Generalized weakness  PT Problem List: Decreased strength;Decreased activity tolerance;Decreased balance;Obesity;Decreased knowledge of use of DME PT Treatment Interventions: Gait training;Therapeutic exercise;Balance training;Stair training   PT Goals Acute Rehab PT Goals PT Goal Formulation: With patient Time For Goal Achievement: 09/08/12 Potential to Achieve Goals: Good Pt will go Supine/Side to Sit: with modified independence PT Goal: Supine/Side to Sit - Progress: Goal set today Pt will go Sit to Stand: with modified independence PT Goal: Sit to Stand - Progress: Goal set today Pt will Ambulate: 51 - 150 feet;with least restrictive assistive device PT Goal: Ambulate - Progress: Goal set today Pt will Go Up / Down Stairs: 6-9 stairs;with mod assist PT Goal: Up/Down Stairs - Progress: Goal set today  Visit Information       Subjective Data  Subjective: Pt states that she has been getting weak for some time now.  She states that she normally uses no assistive device at home.  She states that her husband was going to help her to the bathroom and she fell back into bed. Patient Stated Goal: To be strong enough to go home   Prior Functioning  Home Living Lives With: Spouse Available Help at Discharge: Family Type of Home: House Home Access: Stairs to enter Secretary/administrator of Steps: 6 Entrance Stairs-Rails: Right Home Layout: One level Bathroom Shower/Tub: Engineer, manufacturing systems: Standard Home Adaptive Equipment: Wheelchair - manual Prior Function Level of Independence: Independent Able to Take Stairs?: Yes Driving: Yes Vocation: Unemployed Communication Communication: No difficulties Dominant Hand: Right    Cognition  Cognition Overall Cognitive Status: Appears within functional limits for tasks assessed/performed Arousal/Alertness: Awake/alert Orientation  Level: Appears intact for tasks assessed Behavior During Session: [facility] for tasks performed    Extremity/Trunk Assessment Right Lower  Extremity Assessment RLE ROM/Strength/Tone: Deficits RLE ROM/Strength/Tone Deficits: hip mm generally 3/5; knee 4-/5 Left Lower Extremity Assessment LLE ROM/Strength/Tone: Deficits LLE ROM/Strength/Tone Deficits: hip mm generally 3/5 ; knee 4-/5   Balance    End of Session PT - End of Session Equipment Utilized During Treatment: Gait belt Activity Tolerance: Patient limited by fatigue Patient left: in chair;with call bell/phone within reach;with family/visitor present  GP     RUSSELL,CINDY 09/05/2012, 2:50 PM

## 2012-09-06 LAB — CBC
HCT: 30.4 % — ABNORMAL LOW (ref 36.0–46.0)
Hemoglobin: 9.7 g/dL — ABNORMAL LOW (ref 12.0–15.0)
RBC: 3.48 MIL/uL — ABNORMAL LOW (ref 3.87–5.11)
WBC: 8.3 10*3/uL (ref 4.0–10.5)

## 2012-09-06 LAB — BASIC METABOLIC PANEL
BUN: 52 mg/dL — ABNORMAL HIGH (ref 6–23)
Chloride: 98 mEq/L (ref 96–112)
GFR calc Af Amer: 25 mL/min — ABNORMAL LOW (ref >90)
Glucose, Bld: 107 mg/dL — ABNORMAL HIGH (ref 70–99)
Potassium: 2.8 mEq/L — ABNORMAL LOW (ref 3.5–5.1)

## 2012-09-06 LAB — GI PATHOGEN PANEL BY PCR, STOOL
Cryptosporidium by PCR: NEGATIVE
G lamblia by PCR: NEGATIVE
Norovirus GI/GII: NEGATIVE
Salmonella by PCR: NEGATIVE
Shigella by PCR: NEGATIVE

## 2012-09-06 LAB — GLUCOSE, CAPILLARY
Glucose-Capillary: 116 mg/dL — ABNORMAL HIGH (ref 70–99)
Glucose-Capillary: 122 mg/dL — ABNORMAL HIGH (ref 70–99)
Glucose-Capillary: 97 mg/dL (ref 70–99)
Glucose-Capillary: 89 mg/dL (ref 70–99)

## 2012-09-06 LAB — PROTIME-INR: INR: 3.21 — ABNORMAL HIGH (ref 0.00–1.49)

## 2012-09-06 MED ORDER — POTASSIUM CHLORIDE 10 MEQ/100ML IV SOLN
INTRAVENOUS | Status: AC
  Filled 2012-09-06: qty 100

## 2012-09-06 MED ORDER — POTASSIUM CHLORIDE 10 MEQ/100ML IV SOLN
10.0000 meq | INTRAVENOUS | Status: AC
  Administered 2012-09-06 (×2): 10 meq via INTRAVENOUS
  Filled 2012-09-06: qty 100

## 2012-09-06 MED ORDER — POTASSIUM CHLORIDE CRYS ER 20 MEQ PO TBCR
40.0000 meq | EXTENDED_RELEASE_TABLET | Freq: Three times a day (TID) | ORAL | Status: DC
  Administered 2012-09-07 (×3): 40 meq via ORAL
  Filled 2012-09-06 (×4): qty 2

## 2012-09-06 MED ORDER — POTASSIUM CHLORIDE CRYS ER 20 MEQ PO TBCR
40.0000 meq | EXTENDED_RELEASE_TABLET | Freq: Two times a day (BID) | ORAL | Status: DC
  Administered 2012-09-06: 40 meq via ORAL
  Filled 2012-09-06: qty 2

## 2012-09-06 MED ORDER — POTASSIUM CHLORIDE 10 MEQ/100ML IV SOLN
INTRAVENOUS | Status: AC
  Administered 2012-09-06: 10 meq via INTRAVENOUS
  Filled 2012-09-06: qty 100

## 2012-09-06 MED ORDER — POTASSIUM CHLORIDE 10 MEQ/100ML IV SOLN
10.0000 meq | INTRAVENOUS | Status: AC
  Administered 2012-09-06 (×3): 10 meq via INTRAVENOUS
  Filled 2012-09-06: qty 100
  Filled 2012-09-06: qty 200
  Filled 2012-09-06: qty 100

## 2012-09-06 MED ORDER — TORSEMIDE 20 MG PO TABS
20.0000 mg | ORAL_TABLET | Freq: Two times a day (BID) | ORAL | Status: DC
  Administered 2012-09-06 – 2012-09-08 (×5): 20 mg via ORAL
  Filled 2012-09-06 (×5): qty 1

## 2012-09-06 NOTE — Progress Notes (Signed)
Physical Therapy Treatment Patient Details Name: Krystal [name] MRN: [id number] DOB: 1946-01-14 Today's Date: 09/06/2012 Time: 4098-1191 PT Time Calculation (min): 25 min Charges:  Gait X23'  PT Assessment / Plan / Recommendation Comments on Treatment Session  Pt. with much improved activity tolerance today, ambulating 150' with RW. Pt. too exhausted upon return to perfom therex, however states she has been working her LE's while sitting in her chair. Pt. very motivated to increase her strength and return to PLOF.     Follow Up Recommendations  Home health PT           Equipment Recommendations  Rolling walker with 5" wheels          Plan Discharge plan remains appropriate    Precautions / Restrictions Precautions Precautions: Fall Restrictions Weight Bearing Restrictions: No       Mobility  Transfers Transfers: Sit to Stand;Stand to Sit Sit to Stand: 5: Supervision Stand to Sit: 5: Supervision Ambulation/Gait Ambulation/Gait Assistance: 5: Supervision Ambulation Distance (Feet): 150 Feet Assistive device: Rolling walker Gait Pattern: Decreased step length - left;Decreased step length - right Gait velocity: slow     PT Goals Acute Rehab PT Goals PT Goal Formulation: With patient Time For Goal Achievement: 09/08/12 Potential to Achieve Goals: Good Pt will go Supine/Side to Sit: with modified independence PT Goal: Supine/Side to Sit - Progress: Progressing toward goal Pt will go Sit to Stand: with modified independence PT Goal: Sit to Stand - Progress: Progressing toward goal Pt will Ambulate: 51 - 150 feet;with least restrictive assistive device PT Goal: Ambulate - Progress: Progressing toward goal (amb 150' with RW, normally walks without AD) Pt will Go Up / Down Stairs: 6-9 stairs;with mod assist PT Goal: Up/Down Stairs - Progress: Not met  Visit Information  Last PT Received On: 09/06/12    Subjective Data  Subjective: Pt. up in chair; states she  would like to walk and get her strength back.  States she has no pain today.   Cognition  Cognition Overall Cognitive Status: Appears within functional limits for tasks assessed/performed Arousal/Alertness: Awake/alert Orientation Level: Appears intact for tasks assessed Behavior During Session: [facility] for tasks performed    Balance     End of Session PT - End of Session Equipment Utilized During Treatment: Gait belt Activity Tolerance: Patient tolerated treatment well Patient left: in chair;with call bell/phone within reach;with chair alarm set;with family/visitor present     Lurena Nida, PTA/CLT 09/06/2012, 12:26 PM

## 2012-09-06 NOTE — Progress Notes (Signed)
[facility] PROGRESS NOTE  AVLEEN BORDWELL QIO:[id number] DOB: Aug 09, 1945 DOA: 09/02/2012 PCP: Colette Ribas, MD  Assessment/Plan: Sepsis: continues to improve. Urine output good, white count WNL afebrile. Continue rocephin and flagyl.   Active Problems:  UTI (urinary tract infection), culture showing no organism predominant. Zosyn discontinued 09/05/12 and rocephin started. Afebrile, non-toxic appearing.   Diarrhea: C. difficile PCR negative. Still awaiting GI pathogen panel. Oral vancomycin discontinued 3/24. Will continue po Flagyl until results back. Stools less frequent and remain more formed.   Acute renal failure: Secondary to above. Continues to improve. Urine output good. Renal ultrasound yields normal appearance of both kidneys without hydronephrosis. Ascites. Sodium stable at 132. Monitor   DIABETES MELLITUS, TYPE II: Better control CBG range 116-150. Appetite improving slowly.  Will continue lantus at half home dose. Home  meal coverage increased to 8u. Continue  SSI. A1c 6.9 .  Atrial fibrillation: Remains rate controlled. HR range 69-98. INR 3.21. Will continue to hold until INR below 3. BB resumed 09/05/12 at half dose.    HTN (hypertension) : SBP range 110-164.  BB resumed 09/05/12 at half home dose. Consider resuming full home dose.   Chronic diastolic heart failure:  No evidence of pulmonary edema clinically. Wt 133.7 up from 127.2 on admission. Will resume home demadex.. Continue to hold zoroxalyn  Hypokalemia: Likely related to diarrhea. Magnesium 2.2.  Replete and recheck.   Hyponatremia:  Stable at 132.   Weakness : improving with PT. Ambulates in hall with walker and minimal assist. PT recommending HH PT.    CKD (chronic kidney disease), stage IV : creatinine continues to trend down. Urine output improved.   OBESITY : BME 42.4. Nutritional consult   Thrombocytopenia, chronic :  Continues to trend up.    Nonalcoholic fatty liver disease      Code  Status: full Family Communication: husband at bedsid Disposition Plan: home with [facility] hopefully tomorrow   Consultants:  none  Procedures:  none  Antibiotics: Zosyn 09/03/12>>> 09/05/12 Flagyl 09/04/12>>  Rocephin 09/02/12-09/03/12; 09/05/12>>>     HPI/Subjective: Ambulating in hall with PT and walker. Denies pain and nausea  Objective: Filed Vitals:   09/05/12 1754 09/05/12 2227 09/06/12 0238 09/06/12 0558  BP: 110/71 164/83 131/85 122/63  Pulse: 87 98 94 69  Temp: 98.5 F (36.9 C) 98.2 F (36.8 C) 98 F (36.7 C) 97.5 F (36.4 C)  TempSrc: Oral Oral Oral Oral  Resp: 20 24 22 20   Height:      Weight:    133.7 kg (294 lb 12.1 oz)  SpO2: 98% 98% 100% 99%    Intake/Output Summary (Last 24 hours) at 09/06/12 1209 Last data filed at 09/06/12 1056  Gross per 24 hour  Intake 2804.83 ml  Output   1901 ml  Net 903.83 ml   Filed Weights   09/04/12 0429 09/05/12 0617 09/06/12 0558  Weight: 130.1 kg (286 lb 13.1 oz) 135 kg (297 lb 9.9 oz) 133.7 kg (294 lb 12.1 oz)    Exam:   General:  Obese NAD  Cardiovascular: irregularly irregular. No MGR trace-1+ edema  Respiratory: normal effort BS clear bilaterally no crackles no wheeze  Abdomen: obese +BS throughout non-tender to palpation  Musculoskeletal: moves all extremities No joint swelling/erythema  Data Reviewed: Basic Metabolic Panel:  Recent Labs Lab 09/02/12 1013 09/03/12 0605 09/04/12 0740 09/05/12 0441 09/06/12 0524  NA 133* 129* 128* 132* 132*  K 2.9* 3.3* 3.1* 2.9* 2.8*  CL 89* 88* 88* 96 98  CO2 27 25 25 23 22   GLUCOSE 221* 261* 97 90 107*  BUN 37* 47* 56* 54* 52*  CREATININE 1.84* 2.71* 2.97* 2.49* 2.26*  CALCIUM 9.5 8.9 8.6 8.7 8.6  MG 2.2  --   --   --   --    Liver Function Tests:  Recent Labs Lab 09/02/12 1013 09/03/12 0605 09/05/12 0441  AST 31 98* 86*  ALT 6 21 25   ALKPHOS 67 63 76  BILITOT 2.3* 2.3* 1.5*  PROT 7.5 6.7 6.2  ALBUMIN 3.6 3.1* 2.6*   No results found for this  basename: LIPASE, AMYLASE,  in the last 168 hours No results found for this basename: AMMONIA,  in the last 168 hours CBC:  Recent Labs Lab 09/02/12 1013 09/03/12 0605 09/04/12 0740 09/05/12 0441 09/06/12 0524  WBC 18.3* 21.6* 14.3* 9.8 8.3  NEUTROABS 15.8* 18.8* 11.6* 7.7  --   HGB 11.0* 11.0* 10.2* 10.1* 9.7*  HCT 34.8* 35.3* 31.4* 31.4* 30.4*  MCV 89.7 89.1 88.2 87.5 87.4  PLT 133* 127* 89* 100* 116*   Cardiac Enzymes:  Recent Labs Lab 09/02/12 1013  TROPONINI <0.30   BNP (last 3 results) No results found for this basename: PROBNP,  in the last 8760 hours CBG:  Recent Labs Lab 09/05/12 1140 09/05/12 1632 09/05/12 2050 09/06/12 0802 09/06/12 1111  GLUCAP 86 127* 150* 116* 122*    Recent Results (from the past 240 hour(s))  URINE CULTURE     Status: None   Collection Time    09/02/12 11:58 AM      Result Value Range Status   Specimen Description URINE, CLEAN CATCH   Final   Special Requests NONE   Final   Culture  Setup Time 09/02/2012 21:51   Final   Colony Count 40,000 COLONIES/ML   Final   Culture     Final   Value: Multiple bacterial morphotypes present, none predominant. Suggest appropriate recollection if clinically indicated.   Report Status 09/04/2012 FINAL   Final  CULTURE, BLOOD (ROUTINE X 2)     Status: None   Collection Time    09/03/12  8:50 AM      Result Value Range Status   Specimen Description BLOOD LEFT ARM   Final   Special Requests BOTTLES DRAWN AEROBIC AND ANAEROBIC 4CC   Final   Culture NO GROWTH 3 DAYS   Final   Report Status PENDING   Incomplete  CULTURE, BLOOD (ROUTINE X 2)     Status: None   Collection Time    09/03/12  8:55 AM      Result Value Range Status   Specimen Description BLOOD RIGHT ARM   Final   Special Requests BOTTLES DRAWN AEROBIC AND ANAEROBIC 6CC   Final   Culture NO GROWTH 3 DAYS   Final   Report Status PENDING   Incomplete  CLOSTRIDIUM DIFFICILE BY PCR     Status: None   Collection Time    09/04/12  9:38  AM      Result Value Range Status   C difficile by pcr NEGATIVE  NEGATIVE Final     Studies: US Renal  09/04/2012  *RADIOLOGY REPORT*  Clinical Data: Sepsis and acute renal failure.  History of chronic kidney disease.  RENAL/URINARY TRACT ULTRASOUND COMPLETE  Comparison:  06/15/2011  Findings:  Right Kidney:  The right kidney measures 12.2 cm in length without hydronephrosis.  Renal echogenicity is within normal limits.  Left Kidney:  Left kidney measures 12.9 cm  in length without hydronephrosis.  Normal left renal echogenicity.  Bladder:  Normal appearance of the urinary bladder.  Other findings:  There is perihepatic ascites.  IMPRESSION: Normal appearance of both kidneys without hydronephrosis.  Ascites.   Original Report Authenticated By: Richarda Overlie, M.D.     Scheduled Meds: . ALPRAZolam  0.25 mg Oral QHS  . cefTRIAXone (ROCEPHIN)  IV  1 g Intravenous Q24H  . citalopram  20 mg Oral QHS  . insulin aspart  0-15 Units Subcutaneous TID WC  . insulin aspart  0-5 Units Subcutaneous QHS  . insulin aspart  8 Units Subcutaneous TID WC  . insulin glargine  15 Units Subcutaneous QHS  . metoprolol tartrate  12.5 mg Oral BID  . metroNIDAZOLE  250 mg Oral TID AC & HS  . potassium chloride  10 mEq Intravenous Q1 Hr x 4  . potassium chloride  40 mEq Oral BID  . sodium chloride  3 mL Intravenous Q12H  . Warfarin - Physician Dosing Inpatient   Does not apply q1800   Continuous Infusions: . sodium chloride 20 mL/hr at 09/05/12 1608  Time spent: 30 minutes  [facility] M  [facility]  If 7PM-7AM, please contact night-coverage at [email or web address], password [facility] 09/06/2012, 12:09 PM  LOS: 4 days   Attending note  Patient interviewed and examined independently. Agree with above. Resume medications as blood pressure will tolerate. Hopefully, home in a day or 2. Needs more aggressive potassium repletion.  Crista Curb, M.D.

## 2012-09-07 ENCOUNTER — Ambulatory Visit: Payer: Medicare Other | Admitting: Adult Health

## 2012-09-07 LAB — CBC
Hemoglobin: 9.5 g/dL — ABNORMAL LOW (ref 12.0–15.0)
Platelets: 143 10*3/uL — ABNORMAL LOW (ref 150–400)
RBC: 3.39 MIL/uL — ABNORMAL LOW (ref 3.87–5.11)
WBC: 8.4 10*3/uL (ref 4.0–10.5)

## 2012-09-07 LAB — GLUCOSE, CAPILLARY
Glucose-Capillary: 87 mg/dL (ref 70–99)
Glucose-Capillary: 116 mg/dL — ABNORMAL HIGH (ref 70–99)

## 2012-09-07 LAB — BASIC METABOLIC PANEL
CO2: 22 mEq/L (ref 19–32)
Chloride: 101 mEq/L (ref 96–112)
Sodium: 137 mEq/L (ref 135–145)

## 2012-09-07 LAB — PROTIME-INR: Prothrombin Time: 28.4 seconds — ABNORMAL HIGH (ref 11.6–15.2)

## 2012-09-07 MED ORDER — WARFARIN SODIUM 5 MG PO TABS
5.0000 mg | ORAL_TABLET | Freq: Once | ORAL | Status: AC
  Administered 2012-09-07: 5 mg via ORAL
  Filled 2012-09-07: qty 1

## 2012-09-07 MED ORDER — WARFARIN - PHARMACIST DOSING INPATIENT: Status: DC

## 2012-09-07 NOTE — Progress Notes (Signed)
[facility] PROGRESS NOTE  Krystal [name] YNW:[id number] DOB: 1945/08/12 DOA: 09/02/2012 PCP: Colette Ribas, MD  Assessment/Plan: Sepsis: Resolved.  Urine output good, white count WNL afebrile. Continue rocephin and flagyl.  Active Problems:  UTI (urinary tract infection), culture showing no organism predominant. Zosyn discontinued 09/05/12 and rocephin started. Afebrile, non-toxic appearing.   Diarrhea: C. difficile PCR negative. GI pathogen panel neg. Oral vancomycin discontinued 3/24. Will discontinue po Flagyl. Only 2 stools yesterday.   Acute renal failure: Secondary to above. Continues to improve. Urine output good. Renal ultrasound yields normal appearance of both kidneys without hydronephrosis. Ascites. Sodium within normal limits today. Monitor   DIABETES MELLITUS, TYPE II: Better control CBG range 87-116. Appetite continues to improve. Continue lantus at half home dose. Home meal coverage increased to 8u. Continue SSI. A1c 6.9 .   Atrial fibrillation: Remains rate controlled. HR 80's. INR 2.8.  BB resumed 09/05/12 at half dose.   HTN (hypertension) : SBP range 96-126. BB resumed 09/05/12 at half home dose. demadex resumed 09/06/12.   Chronic diastolic heart failure: No evidence of pulmonary edema clinically. Wt trending down slightly. continue demadex.  Continue to hold zoroxalyn   Hypokalemia: Likely related to diarrhea. Magnesium 2.2. Increased po potasium monitor   Hyponatremia: resolved   Weakness : improving with PT. Ambulates in hall with walker and minimal assist. PT recommending HH PT.   CKD (chronic kidney disease), stage IV : creatinine continues to trend down. Urine output improved. Baseline appears to be 1.5.  OBESITY : BME 42.4. Nutritional consult   Thrombocytopenia, chronic : Improving.    Nonalcoholic fatty liver disease     Code Status: full Family Communication:  Disposition Plan: home hopefully  tomorrow   Consultants:  none  Procedures:  none  Antibiotics: Zosyn 09/03/12>>> 09/05/12  Flagyl 09/04/12>> 09/07/12 Rocephin 09/02/12-09/03/12; 09/05/12>>>      HPI/Subjective: States feeling better. Appetite improved  Objective: Filed Vitals:   09/06/12 0558 09/06/12 1420 09/06/12 2046 09/07/12 0410  BP: 122/63 96/58 126/75 114/64  Pulse: 69 81 81 82  Temp: 97.5 F (36.4 C) 97.7 F (36.5 C) 98.4 F (36.9 C) 97.5 F (36.4 C)  TempSrc: Oral Oral Axillary Oral  Resp: 20 18 18 18   Height:      Weight: 133.7 kg (294 lb 12.1 oz)   133.3 kg (293 lb 14 oz)  SpO2: 99% 99% 100% 97%    Intake/Output Summary (Last 24 hours) at 09/07/12 1306 Last data filed at 09/07/12 0739  Gross per 24 hour  Intake   1148 ml  Output   1350 ml  Net   -202 ml   Filed Weights   09/05/12 0617 09/06/12 0558 09/07/12 0410  Weight: 135 kg (297 lb 9.9 oz) 133.7 kg (294 lb 12.1 oz) 133.3 kg (293 lb 14 oz)    Exam:   General:  Obese NAD  Cardiovascular: irregularly irregular No MGR 1+LE edema  Respiratory: normal effort BS clear no crackles  Abdomen: obese non-tender to palpation +BS   Musculoskeletal: Moves all extremities ambulates with walker    Data Reviewed: Basic Metabolic Panel:  Recent Labs Lab 09/02/12 1013 09/03/12 0605 09/04/12 0740 09/05/12 0441 09/06/12 0524 09/07/12 0437  NA 133* 129* 128* 132* 132* 137  K 2.9* 3.3* 3.1* 2.9* 2.8* 3.1*  CL 89* 88* 88* 96 98 101  CO2 27 25 25 23 22 22   GLUCOSE 221* 261* 97 90 107* 86  BUN 37* 47* 56* 54* 52* 50*  CREATININE 1.84*  2.71* 2.97* 2.49* 2.26* 2.00*  CALCIUM 9.5 8.9 8.6 8.7 8.6 8.4  MG 2.2  --   --   --   --   --    Liver Function Tests:  Recent Labs Lab 09/02/12 1013 09/03/12 0605 09/05/12 0441  AST 31 98* 86*  ALT 6 21 25   ALKPHOS 67 63 76  BILITOT 2.3* 2.3* 1.5*  PROT 7.5 6.7 6.2  ALBUMIN 3.6 3.1* 2.6*   No results found for this basename: LIPASE, AMYLASE,  in the last 168 hours No results found for  this basename: AMMONIA,  in the last 168 hours CBC:  Recent Labs Lab 09/02/12 1013 09/03/12 0605 09/04/12 0740 09/05/12 0441 09/06/12 0524 09/07/12 0437  WBC 18.3* 21.6* 14.3* 9.8 8.3 8.4  NEUTROABS 15.8* 18.8* 11.6* 7.7  --   --   HGB 11.0* 11.0* 10.2* 10.1* 9.7* 9.5*  HCT 34.8* 35.3* 31.4* 31.4* 30.4* 30.0*  MCV 89.7 89.1 88.2 87.5 87.4 88.5  PLT 133* 127* 89* 100* 116* 143*   Cardiac Enzymes:  Recent Labs Lab 09/02/12 1013  TROPONINI <0.30   BNP (last 3 results) No results found for this basename: PROBNP,  in the last 8760 hours CBG:  Recent Labs Lab 09/06/12 1111 09/06/12 1616 09/06/12 2138 09/07/12 0735 09/07/12 1120  GLUCAP 122* 97 89 87 116*    Recent Results (from the past 240 hour(s))  URINE CULTURE     Status: None   Collection Time    09/02/12 11:58 AM      Result Value Range Status   Specimen Description URINE, CLEAN CATCH   Final   Special Requests NONE   Final   Culture  Setup Time 09/02/2012 21:51   Final   Colony Count 40,000 COLONIES/ML   Final   Culture     Final   Value: Multiple bacterial morphotypes present, none predominant. Suggest appropriate recollection if clinically indicated.   Report Status 09/04/2012 FINAL   Final  CULTURE, BLOOD (ROUTINE X 2)     Status: None   Collection Time    09/03/12  8:50 AM      Result Value Range Status   Specimen Description BLOOD LEFT ARM   Final   Special Requests BOTTLES DRAWN AEROBIC AND ANAEROBIC 4CC   Final   Culture NO GROWTH 4 DAYS   Final   Report Status PENDING   Incomplete  CULTURE, BLOOD (ROUTINE X 2)     Status: None   Collection Time    09/03/12  8:55 AM      Result Value Range Status   Specimen Description BLOOD RIGHT ARM   Final   Special Requests BOTTLES DRAWN AEROBIC AND ANAEROBIC 6CC   Final   Culture NO GROWTH 4 DAYS   Final   Report Status PENDING   Incomplete  CLOSTRIDIUM DIFFICILE BY PCR     Status: None   Collection Time    09/04/12  9:38 AM      Result Value Range  Status   C difficile by pcr NEGATIVE  NEGATIVE Final     Studies: No results found.  Scheduled Meds: . ALPRAZolam  0.25 mg Oral QHS  . cefTRIAXone (ROCEPHIN)  IV  1 g Intravenous Q24H  . citalopram  20 mg Oral QHS  . insulin aspart  0-15 Units Subcutaneous TID WC  . insulin aspart  0-5 Units Subcutaneous QHS  . insulin aspart  8 Units Subcutaneous TID WC  . insulin glargine  15 Units Subcutaneous QHS  .  metoprolol tartrate  12.5 mg Oral BID  . metroNIDAZOLE  250 mg Oral TID AC & HS  . potassium chloride  40 mEq Oral TID  . sodium chloride  3 mL Intravenous Q12H  . torsemide  20 mg Oral BID  . warfarin  5 mg Oral ONCE-1800  . [START ON 09/08/2012] Warfarin - Pharmacist Dosing Inpatient   Does not apply Q24H   Continuous Infusions:   Principal Problem:   Sepsis Active Problems:   DIABETES MELLITUS, TYPE II   OBESITY   Atrial fibrillation   HTN (hypertension)   Thrombocytopenia   Chronic diastolic heart failure   UTI (urinary tract infection)   Hypokalemia   Weakness   CKD (chronic kidney disease), stage IV   Nonalcoholic fatty liver disease   Acute renal failure    Time spent: 35 minutes    [facility] M  [facility]  If 7PM-7AM, please contact night-coverage at [email or web address], password [facility] 09/07/2012, 1:06 PM  LOS: 5 days   Attending: Patient seen and examined. Agree with above note.

## 2012-09-07 NOTE — Progress Notes (Signed)
UR Chart Review Completed  
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
## 2012-09-07 NOTE — Progress Notes (Signed)
Physical Therapy Treatment Patient Details Name: Krystal [name] MRN: [id number] DOB: 05/27/1946 Today's Date: 09/07/2012 Time: 4098-1191 PT Time Calculation (min): 29 min  PT Assessment / Plan / Recommendation Comments on Treatment Session  Pt continues with generalized deconditioning but should be functional at home.  Her gait is stable with a walker and she is independent with transfers.  She was instructed in how to slowly increase her activity level at home.    Follow Up Recommendations  Home health PT     Does the patient have the potential to tolerate intense rehabilitation     Barriers to Discharge        Equipment Recommendations  Rolling walker with 5" wheels    Recommendations for Other Services    Frequency Min 3X/week   Plan Discharge plan remains appropriate;Frequency needs to be updated    Precautions / Restrictions     Pertinent Vitals/Pain     Mobility  Transfers Sit to Stand: 6: Modified independent (Device/Increase time);From chair/3-in-1 Stand to Sit: 6: Modified independent (Device/Increase time);To chair/3-in-1 Ambulation/Gait Ambulation/Gait Assistance: 6: Modified independent (Device/Increase time) Assistive device: Rolling walker Gait Pattern: Within Functional Limits Gait velocity: slow...needed 2 rests during gait Stairs: No Wheelchair Mobility Wheelchair Mobility: No    Exercises General Exercises - Upper Extremity Shoulder Flexion: AROM;Both;5 reps General Exercises - Lower Extremity Long Arc Quad: AROM;Both;5 reps;Seated   PT Diagnosis:    PT Problem List:   PT Treatment Interventions:     PT Goals Acute Rehab PT Goals PT Goal: Supine/Side to Sit - Progress: Met PT Goal: Ambulate - Progress: Met PT Goal: Up/Down Stairs - Progress: Not met  Visit Information  Last PT Received On: 09/07/12    Subjective Data  Subjective: no c/o   Cognition       Balance     End of Session PT - End of Session Equipment Utilized During  Treatment: Gait belt Activity Tolerance: Patient tolerated treatment well Patient left: in chair   GP     Konrad Penta 09/07/2012, 3:33 PM

## 2012-09-08 DIAGNOSIS — E860 Dehydration: Secondary | ICD-10-CM

## 2012-09-08 DIAGNOSIS — E669 Obesity, unspecified: Secondary | ICD-10-CM

## 2012-09-08 LAB — CULTURE, BLOOD (ROUTINE X 2): Culture: NO GROWTH

## 2012-09-08 LAB — PROTIME-INR: INR: 2.52 — ABNORMAL HIGH (ref 0.00–1.49)

## 2012-09-08 LAB — GLUCOSE, CAPILLARY

## 2012-09-08 LAB — CBC
HCT: 31.5 % — ABNORMAL LOW (ref 36.0–46.0)
MCV: 89.7 fL (ref 78.0–100.0)
Platelets: 168 10*3/uL (ref 150–400)
RBC: 3.51 MIL/uL — ABNORMAL LOW (ref 3.87–5.11)
WBC: 6.9 10*3/uL (ref 4.0–10.5)

## 2012-09-08 LAB — BASIC METABOLIC PANEL
CO2: 23 mEq/L (ref 19–32)
Calcium: 8.4 mg/dL (ref 8.4–10.5)
Chloride: 103 mEq/L (ref 96–112)
Potassium: 3.7 mEq/L (ref 3.5–5.1)
Sodium: 138 mEq/L (ref 135–145)

## 2012-09-08 MED ORDER — METOPROLOL TARTRATE 12.5 MG HALF TABLET: 25.0000 mg | ORAL_TABLET | Freq: Two times a day (BID) | ORAL | Status: DC

## 2012-09-08 MED ORDER — METOPROLOL TARTRATE 25 MG PO TABS: 12.5000 mg | ORAL_TABLET | Freq: Two times a day (BID) | ORAL | Status: DC

## 2012-09-08 MED ORDER — POTASSIUM CHLORIDE CRYS ER 20 MEQ PO TBCR
40.0000 meq | EXTENDED_RELEASE_TABLET | Freq: Two times a day (BID) | ORAL | Status: DC
  Administered 2012-09-08: 40 meq via ORAL
  Filled 2012-09-08: qty 2

## 2012-09-08 MED ORDER — INSULIN ASPART 100 UNIT/ML ~~LOC~~ SOLN: 8.0000 [IU] | Freq: Three times a day (TID) | SUBCUTANEOUS | Status: DC

## 2012-09-08 MED ORDER — METOPROLOL TARTRATE 12.5 MG HALF TABLET: 12.5000 mg | ORAL_TABLET | Freq: Two times a day (BID) | ORAL | Status: DC

## 2012-09-08 MED ORDER — INSULIN GLARGINE 100 UNIT/ML ~~LOC~~ SOLN: 15.0000 [IU] | Freq: Every day | SUBCUTANEOUS | Status: DC

## 2012-09-08 MED ORDER — WARFARIN SODIUM 5 MG PO TABS: 5.0000 mg | ORAL_TABLET | Freq: Once | ORAL | Status: DC

## 2012-09-08 MED ORDER — INSULIN LISPRO 100 UNIT/ML ~~LOC~~ SOLN: 5.0000 [IU] | Freq: Three times a day (TID) | SUBCUTANEOUS | Status: DC
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
## 2012-09-08 NOTE — Discharge Summary (Signed)
Physician Discharge Summary  Krystal [name] ZOX:[id number] DOB: 11-25-1945 DOA: 09/02/2012  PCP: Colette Ribas, MD  Admit date: 09/02/2012 Discharge date: 09/08/2012  Time spent: 40 minutes  Recommendations for Outpatient Follow-up:  Follow up with PCP in week. Recommend BMET to track potassium level.   Discharge Diagnoses:  Principal Problem:   Sepsis Active Problems:   DIABETES MELLITUS, TYPE II   OBESITY   Atrial fibrillation   HTN (hypertension)   Thrombocytopenia   Chronic diastolic heart failure   UTI (urinary tract infection)   Hypokalemia   Weakness   CKD (chronic kidney disease), stage IV   Nonalcoholic fatty liver disease   Acute renal failure   Discharge Condition: stable  Diet recommendation: carb modified  Filed Weights   09/06/12 0558 09/07/12 0410 09/08/12 0422  Weight: 133.7 kg (294 lb 12.1 oz) 133.3 kg (293 lb 14 oz) 131.5 kg (289 lb 14.5 oz)    History of present illness:  Krystal [name] is an 67 y.o. female who presented to ED on 09/02/12 with worsening generalized weakness. She reported a bout of vomiting and diarrhea that started a few days prior. The diarrhea had resolved, but she felt nauseated. She was able to take down a few liquids and crackers. She had not taken her insulin because she had not been eating much. She ate an egg salad at [employer] prior to becoming ill. She became so weak  that she was unable to even get into the car. She had chills at home and subjective fevers. She had periodic dysuria. No flank pain. No hematemesis or melena. She reported having had a similar gastrointestinal illness a month ago. She was given Tamiflu empirically by her primary care provider. Her symptoms resolved. In the emergency room, she was noted to have a temperature of 100.7. Her white blood cell count was 18,000. She had many white cells in her urine. She denied abdominal pain. She had been given Rocephin, IV potassium, and 500 cc of saline.  Patient's reported that since her diuretics were increased earlier this month, she had become much weaker. Followed by [facility] for chronic diastolic heart failure and was seen by Joni Reining, nurse practitioner earlier this month.   Hospital Course:  Sepsis: admitted to [facility]. Lactic acid 2.3. Supported with IV fluids and antibiotics. Started on rocephin and flagyl. Rocephin changed to zosyn on admission and changed back to rocephin once cultures back. Diuretics held.   Responded favorably to treatment. Fever trended down as did white count. Resolved at discharge.  UTI (urinary tract infection): Cultures showing no organism.   Rocephin for 7 days. No dysuria, good urine output. Afebrile. Non-toxic appearing.  Diarrhea: Started on oral Vanc and flagyl.  cdiff negative, GI pathogen panel neg. Oral Vanc stopped 3/24. Flagy discontinued 09/07/12. At discharge pt stools formed and frequency within normal range.    Active Problems:  DIABETES MELLITUS, TYPE II: HgA1c 6.7. Home regimen held at admission. As pt improved and appetite increased her home regimen resumed. Basal resumed at lower dose and meal coverage increase dose. Pt to continue current doses as CBG range 84-146 and appetite not reliable. Recommend follow up with PCP 1 week.   Atrial fibrillation: Continued beta blocker, calcium channel blocker and Coumadin. Of note BB dose decreased by half. INR  2.52. Will follow up with PCP regarding INR checks  HTN (hypertension) : initially hypotensive. Home meds held. BB dose decreased by half. Diuretics held initially and resumed by discharge.  Chronic diastolic heart failure: Hypovolemic on admission de to sepsis. Once resolved and BP stable diuretics resumed. Admission wt 127.9kg. Wt at discharge 131.5. Pt with some LE edema at discharge but weight trending down. No crackles on resp exam.    Hypokalemia: Repleted. Mag level 2.2. At discharge potassium 3.7. Will discharge resuming home  dose. Recommend BMET 1 week for tracking potassium.   Weakness secondary to above : seen by PT. Will be discharged with [facility] per recommendation. Pt able to ambulate short distances with minimal assist at discharge.   CKD (chronic kidney disease), stage IV, stable   OBESITY : BMI 44.2. Nutritional consult  Thrombocytopenia, chronic stable at discharge  Nonalcoholic fatty liver disease: Patient has previous ultrasound of the abdomen showing nodular contour. Patient reports that she does not drink alcohol and that her "liver problems" are due to fatty liver. She avoids several medications due to liver disease, including Tylenol.    Procedures:    Consultations:  none  Discharge Exam: Filed Vitals:   09/07/12 1405 09/07/12 2054 09/08/12 0422 09/08/12 0958  BP: 127/61 122/63 103/63 124/69  Pulse: 74 74 78 72  Temp: 97.6 F (36.4 C) 97.4 F (36.3 C) 97.8 F (36.6 C)   TempSrc: Oral Oral Oral   Resp: 18 18 18    Height:      Weight:   131.5 kg (289 lb 14.5 oz)   SpO2: 100% 97% 96% 97%    General: awake alert NAD Cardiovascular: irregularly irregular No MGR 1+LE edema Respiratory: normal effort BS clear bilaterally no wheeze no crackles  Discharge Instructions      Discharge Orders   Future Orders Complete By Expires     Call MD for:  persistant nausea and vomiting  As directed     Call MD for:  temperature >100.4  As directed     Diet - low sodium heart healthy  As directed     Discharge instructions  As directed     Comments:      Note changes in insulin doses at discharge. As appetite improves and CBG's increase may resume home dose. Continue to monitor CBG with meals and at bedtime Note change in lopressor dose. Follow up with PCP regarding BP control.    Increase activity slowly  As directed         Medication List    TAKE these medications       ALPRAZolam 0.5 MG tablet  Commonly known as:  XANAX  Take 0.25 mg by mouth at bedtime.     atorvastatin 20 MG  tablet  Commonly known as:  LIPITOR  Take 1 tablet (20 mg total) by mouth at bedtime.     citalopram 20 MG tablet  Commonly known as:  CELEXA  Take 20 mg by mouth at bedtime.     insulin glargine 100 UNIT/ML injection  Commonly known as:  LANTUS  Inject 0.15 mLs (15 Units total) into the skin at bedtime.     insulin lispro 100 UNIT/ML injection  Commonly known as:  HUMALOG  Inject 5 Units into the skin 3 (three) times daily before meals.     metolazone 2.5 MG tablet  Commonly known as:  ZAROXOLYN  Take 1 tablet (2.5 mg total) by mouth daily as needed. Only takes when patient gains 5 pounds.     metoprolol tartrate 12.5 mg Tabs  Commonly known as:  LOPRESSOR  Take 0.5 tablets (12.5 mg total) by mouth 2 (two) times daily.  potassium chloride SA 20 MEQ tablet  Commonly known as:  K-DUR,KLOR-CON  Take 2 tablets (40 mEq total) by mouth 2 (two) times daily.     SALINE NASAL MIST NA  Place 1 spray into the nose daily as needed (for congestion).     torsemide 20 MG tablet  Commonly known as:  DEMADEX  Take 2 tablets (40 mg total) by mouth daily.     verapamil 180 MG CR tablet  Commonly known as:  CALAN-SR  Take 1 tablet (180 mg total) by mouth at bedtime.     Vitamin D3 2000 UNITS Tabs  Take 1 capsule by mouth daily.     warfarin 5 MG tablet  Commonly known as:  COUMADIN  Take 1-2 tablets (5-10 mg total) by mouth daily. Takes 5 mg on Mon, Wed, and Fri. All other days takes 10 mg.       Follow-up Information   Follow up with Colette Ribas, MD. Schedule an appointment as soon as possible for a visit in 1 week. (recommend evaluation CBG as insulin changed at discharge. )    Contact information:   [address] [place] [address] [phone]        The results of significant diagnostics from this hospitalization (including imaging, microbiology, ancillary and laboratory) are listed below for reference.    Significant Diagnostic  Studies: US Renal  09/04/2012  *RADIOLOGY REPORT*  Clinical Data: Sepsis and acute renal failure.  History of chronic kidney disease.  RENAL/URINARY TRACT ULTRASOUND COMPLETE  Comparison:  06/15/2011  Findings:  Right Kidney:  The right kidney measures 12.2 cm in length without hydronephrosis.  Renal echogenicity is within normal limits.  Left Kidney:  Left kidney measures 12.9 cm in length without hydronephrosis.  Normal left renal echogenicity.  Bladder:  Normal appearance of the urinary bladder.  Other findings:  There is perihepatic ascites.  IMPRESSION: Normal appearance of both kidneys without hydronephrosis.  Ascites.   Original Report Authenticated By: Richarda Overlie, M.D.    Dg Chest Portable 1 View  09/02/2012  *RADIOLOGY REPORT*  Clinical Data: Nausea, vomiting, diarrhea  PORTABLE CHEST - 1 VIEW  Comparison: 07/25/2011  Findings: Study is limited by patient's large body habitus and poor inspiration.  Cardiomegaly again noted.  Central vascular congestion without convincing pulmonary edema.  Bilateral basilar atelectasis or infiltrate.  IMPRESSION: Limited study as described above.  Central vascular congestion without convincing pulmonary edema.  Bilateral basilar atelectasis or infiltrate.  Cardiomegaly again noted.   Original Report Authenticated By: Natasha Mead, M.D.     Microbiology: Recent Results (from the past 240 hour(s))  URINE CULTURE     Status: None   Collection Time    09/02/12 11:58 AM      Result Value Range Status   Specimen Description URINE, CLEAN CATCH   Final   Special Requests NONE   Final   Culture  Setup Time 09/02/2012 21:51   Final   Colony Count 40,000 COLONIES/ML   Final   Culture     Final   Value: Multiple bacterial morphotypes present, none predominant. Suggest appropriate recollection if clinically indicated.   Report Status 09/04/2012 FINAL   Final  CULTURE, BLOOD (ROUTINE X 2)     Status: None   Collection Time    09/03/12  8:50 AM      Result Value Range  Status   Specimen Description BLOOD LEFT ARM   Final   Special Requests BOTTLES DRAWN  AEROBIC AND ANAEROBIC 4CC   Final   Culture NO GROWTH 5 DAYS   Final   Report Status 09/08/2012 FINAL   Final  CULTURE, BLOOD (ROUTINE X 2)     Status: None   Collection Time    09/03/12  8:55 AM      Result Value Range Status   Specimen Description BLOOD RIGHT ARM   Final   Special Requests BOTTLES DRAWN AEROBIC AND ANAEROBIC 6CC   Final   Culture NO GROWTH 5 DAYS   Final   Report Status 09/08/2012 FINAL   Final  CLOSTRIDIUM DIFFICILE BY PCR     Status: None   Collection Time    09/04/12  9:38 AM      Result Value Range Status   C difficile by pcr NEGATIVE  NEGATIVE Final     Labs: Basic Metabolic Panel:  Recent Labs Lab 09/02/12 1013  09/04/12 0740 09/05/12 0441 09/06/12 0524 09/07/12 0437 09/08/12 0449  NA 133*  < > 128* 132* 132* 137 138  K 2.9*  < > 3.1* 2.9* 2.8* 3.1* 3.7  CL 89*  < > 88* 96 98 101 103  CO2 27  < > 25 23 22 22 23   GLUCOSE 221*  < > 97 90 107* 86 97  BUN 37*  < > 56* 54* 52* 50* 38*  CREATININE 1.84*  < > 2.97* 2.49* 2.26* 2.00* 1.83*  CALCIUM 9.5  < > 8.6 8.7 8.6 8.4 8.4  MG 2.2  --   --   --   --   --   --   < > = values in this interval not displayed. Liver Function Tests:  Recent Labs Lab 09/02/12 1013 09/03/12 0605 09/05/12 0441  AST 31 98* 86*  ALT 6 21 25   ALKPHOS 67 63 76  BILITOT 2.3* 2.3* 1.5*  PROT 7.5 6.7 6.2  ALBUMIN 3.6 3.1* 2.6*     CBC:  Recent Labs Lab 09/02/12 1013 09/03/12 0605 09/04/12 0740 09/05/12 0441 09/06/12 0524 09/07/12 0437 09/08/12 0449  WBC 18.3* 21.6* 14.3* 9.8 8.3 8.4 6.9  NEUTROABS 15.8* 18.8* 11.6* 7.7  --   --   --   HGB 11.0* 11.0* 10.2* 10.1* 9.7* 9.5* 9.7*  HCT 34.8* 35.3* 31.4* 31.4* 30.4* 30.0* 31.5*  MCV 89.7 89.1 88.2 87.5 87.4 88.5 89.7  PLT 133* 127* 89* 100* 116* 143* 168   Cardiac Enzymes:  Recent Labs Lab 09/02/12 1013  TROPONINI <0.30     CBG:  Recent Labs Lab 09/07/12 1120  09/07/12 1631 09/07/12 2011 09/08/12 0744 09/08/12 1122  GLUCAP 116* 125* 146* 124* 82       Signed:  BLACK,KAREN M  [facility] 09/08/2012, 1:08 PM Attending: Patient seen and examined. Above note  reviewed. Patient is medically stable for discharge.

## 2012-09-08 NOTE — Progress Notes (Signed)
AVS reviewed with patient and pt's spouse.  Prescriptions provided to patient.  Verbalized understanding of orders.  Teach back method used.  Pt's IV removed.  Site WNL.  Pt educated regarding DM and CHF with regards to checking blood sugar and daily weights.  Pt verbalized understanding.  Pt transported by NT via w/c to main entrance.  Pt stable at time of d/c.

## 2012-09-08 NOTE — Progress Notes (Signed)
Began to review AVS with patient.  Patient stated that her insurance did not cover Novolog insulin.  Stated that it only covered Humalog.  Toya Smothers, NP notified via text page.

## 2012-09-15 ENCOUNTER — Telehealth: Payer: Self-pay | Admitting: *Deleted

## 2012-09-15 ENCOUNTER — Ambulatory Visit (INDEPENDENT_AMBULATORY_CARE_PROVIDER_SITE_OTHER): Payer: Medicare Other | Admitting: *Deleted

## 2012-09-15 DIAGNOSIS — R0602 Shortness of breath: Secondary | ICD-10-CM

## 2012-09-15 DIAGNOSIS — I4891 Unspecified atrial fibrillation: Secondary | ICD-10-CM

## 2012-09-15 DIAGNOSIS — R635 Abnormal weight gain: Secondary | ICD-10-CM

## 2012-09-15 DIAGNOSIS — Z7901 Long term (current) use of anticoagulants: Secondary | ICD-10-CM

## 2012-09-15 LAB — POCT INR: INR: 3

## 2012-09-15 NOTE — Telephone Encounter (Signed)
Received incoming call from [facility] with [facility] to advise pt has gained 16pounds this week, noted pt took an extra metolazone 2.5mg  and lost 6lbs last night, pt complained of SOB and dry cough prior to 6lb loss, pt still noting slight SOB with dry cough, nurse Olegario Messier also noted slight congestion in pt left upper lobe,  this nurse spoke with [facility] NP and was given verbal orders as follows:  1)TAKE AN EXTRA METOLAZONE 2.5MG  TODAY 2) INCREASE POTASSIUM BY 40MG  TODAY AND TOMORROW TO EQUAL FOR THE NEXT TWO DAYS PER PT CURRENTLY TAKING BID TO EQUAL DAILY (ADVISED TO TAKE BID)  3) HAVE BMET/PRO BNP DRAWN TOMORROW AT [facility]/OR [facility]  4) HAVE A CHEST X-RAY PERFORMED AT [facility] RADIOLOGY DEPT  Placed orders in chart for chest x-ray/labs,manually faxed released chest x-ray to radiology dept, called Olegario Messier to give verbal orders for pt to implement, Olegario Messier noted she will inform pt of all instructions while currently in home with pt at time of phone call

## 2012-09-16 ENCOUNTER — Ambulatory Visit (HOSPITAL_COMMUNITY)
Admission: RE | Admit: 2012-09-16 | Discharge: 2012-09-16 | Disposition: A | Discharge status: Home / Self Care | Payer: Medicare Other | Source: Ambulatory Visit | Attending: Adult Health | Admitting: Adult Health

## 2012-09-16 DIAGNOSIS — R059 Cough, unspecified: Secondary | ICD-10-CM | POA: Insufficient documentation

## 2012-09-16 DIAGNOSIS — R05 Cough: Secondary | ICD-10-CM | POA: Insufficient documentation

## 2012-09-18 ENCOUNTER — Emergency Department (HOSPITAL_COMMUNITY)
Admission: EM | Admit: 2012-09-18 | Discharge: 2012-09-18 | Disposition: A | Discharge status: Home / Self Care | Payer: Medicare Other | Attending: Emergency Medicine | Admitting: Emergency Medicine

## 2012-09-18 ENCOUNTER — Other Ambulatory Visit: Payer: Self-pay | Admitting: *Deleted

## 2012-09-18 ENCOUNTER — Telehealth: Payer: Self-pay | Admitting: *Deleted

## 2012-09-18 ENCOUNTER — Encounter (HOSPITAL_COMMUNITY): Payer: Self-pay | Admitting: *Deleted

## 2012-09-18 DIAGNOSIS — Z8739 Personal history of other diseases of the musculoskeletal system and connective tissue: Secondary | ICD-10-CM | POA: Insufficient documentation

## 2012-09-18 DIAGNOSIS — Z79899 Other long term (current) drug therapy: Secondary | ICD-10-CM | POA: Insufficient documentation

## 2012-09-18 DIAGNOSIS — I5033 Acute on chronic diastolic (congestive) heart failure: Secondary | ICD-10-CM | POA: Insufficient documentation

## 2012-09-18 DIAGNOSIS — R5381 Other malaise: Secondary | ICD-10-CM | POA: Insufficient documentation

## 2012-09-18 DIAGNOSIS — Z87828 Personal history of other (healed) physical injury and trauma: Secondary | ICD-10-CM | POA: Insufficient documentation

## 2012-09-18 DIAGNOSIS — E119 Type 2 diabetes mellitus without complications: Secondary | ICD-10-CM | POA: Insufficient documentation

## 2012-09-18 DIAGNOSIS — H409 Unspecified glaucoma: Secondary | ICD-10-CM | POA: Insufficient documentation

## 2012-09-18 DIAGNOSIS — R9389 Abnormal findings on diagnostic imaging of other specified body structures: Secondary | ICD-10-CM

## 2012-09-18 DIAGNOSIS — F3289 Other specified depressive episodes: Secondary | ICD-10-CM | POA: Insufficient documentation

## 2012-09-18 DIAGNOSIS — E876 Hypokalemia: Secondary | ICD-10-CM

## 2012-09-18 DIAGNOSIS — E669 Obesity, unspecified: Secondary | ICD-10-CM | POA: Insufficient documentation

## 2012-09-18 DIAGNOSIS — F329 Major depressive disorder, single episode, unspecified: Secondary | ICD-10-CM | POA: Insufficient documentation

## 2012-09-18 DIAGNOSIS — I10 Essential (primary) hypertension: Secondary | ICD-10-CM | POA: Insufficient documentation

## 2012-09-18 DIAGNOSIS — Z862 Personal history of diseases of the blood and blood-forming organs and certain disorders involving the immune mechanism: Secondary | ICD-10-CM | POA: Insufficient documentation

## 2012-09-18 DIAGNOSIS — Z794 Long term (current) use of insulin: Secondary | ICD-10-CM | POA: Insufficient documentation

## 2012-09-18 DIAGNOSIS — Z8679 Personal history of other diseases of the circulatory system: Secondary | ICD-10-CM | POA: Insufficient documentation

## 2012-09-18 DIAGNOSIS — Z7901 Long term (current) use of anticoagulants: Secondary | ICD-10-CM | POA: Insufficient documentation

## 2012-09-18 DIAGNOSIS — Z8542 Personal history of malignant neoplasm of other parts of uterus: Secondary | ICD-10-CM | POA: Insufficient documentation

## 2012-09-18 LAB — CBC WITH DIFFERENTIAL/PLATELET
Eosinophils Relative: 2 % (ref 0–5)
HCT: 28.7 % — ABNORMAL LOW (ref 36.0–46.0)
Hemoglobin: 9 g/dL — ABNORMAL LOW (ref 12.0–15.0)
Lymphocytes Relative: 15 % (ref 12–46)
Lymphs Abs: 1 10*3/uL (ref 0.7–4.0)
MCV: 87.8 fL (ref 78.0–100.0)
Monocytes Absolute: 0.6 10*3/uL (ref 0.1–1.0)
Monocytes Relative: 9 % (ref 3–12)
Neutro Abs: 4.8 10*3/uL (ref 1.7–7.7)
RBC: 3.27 MIL/uL — ABNORMAL LOW (ref 3.87–5.11)
WBC: 6.5 10*3/uL (ref 4.0–10.5)

## 2012-09-18 LAB — BASIC METABOLIC PANEL
CO2: 34 mEq/L — ABNORMAL HIGH (ref 19–32)
Chloride: 90 mEq/L — ABNORMAL LOW (ref 96–112)
Creatinine, Ser: 1.69 mg/dL — ABNORMAL HIGH (ref 0.50–1.10)
Potassium: 2.8 mEq/L — ABNORMAL LOW (ref 3.5–5.1)
Sodium: 135 mEq/L (ref 135–145)

## 2012-09-18 MED ORDER — POTASSIUM CHLORIDE 10 MEQ/100ML IV SOLN
INTRAVENOUS | Status: AC
  Administered 2012-09-18: 10 meq via INTRAVENOUS
  Filled 2012-09-18: qty 100

## 2012-09-18 MED ORDER — POTASSIUM CHLORIDE CRYS ER 20 MEQ PO TBCR
EXTENDED_RELEASE_TABLET | ORAL | Status: AC
  Administered 2012-09-18: 40 meq via ORAL
  Filled 2012-09-18: qty 2

## 2012-09-18 MED ORDER — POTASSIUM CHLORIDE 10 MEQ/100ML IV SOLN: 10.0000 meq | Freq: Once | INTRAVENOUS | Status: AC

## 2012-09-18 MED ORDER — MAGNESIUM SULFATE 40 MG/ML IJ SOLN
INTRAMUSCULAR | Status: AC
  Administered 2012-09-18: 2 g via INTRAVENOUS
  Filled 2012-09-18: qty 50

## 2012-09-18 MED ORDER — POTASSIUM CHLORIDE CRYS ER 20 MEQ PO TBCR: 40.0000 meq | EXTENDED_RELEASE_TABLET | Freq: Once | ORAL | Status: AC

## 2012-09-18 MED ORDER — MAGNESIUM SULFATE 40 MG/ML IJ SOLN: 2.0000 g | Freq: Once | INTRAMUSCULAR | Status: AC
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
## 2012-09-18 NOTE — Telephone Encounter (Signed)
Reviewed labs.Hypokalemic at 2.7 despite increased potassium supplement during diureses. Patent is weak with shortness of breath Creatinine of 1.76 prohibiting CT scan at this time. Due to patients worsening symptoms, she is advised to be seen in ER for possible admission.

## 2012-09-18 NOTE — ED Notes (Signed)
Pt was sent to [facility] ED by [facility] for "low potassium".

## 2012-09-18 NOTE — Telephone Encounter (Signed)
L[facility] at [facility] called with 2.7 K+ from today's blood draw.  Please advise

## 2012-09-19 ENCOUNTER — Telehealth: Payer: Self-pay | Admitting: Adult Health

## 2012-09-19 ENCOUNTER — Other Ambulatory Visit: Payer: Self-pay | Admitting: *Deleted

## 2012-09-19 ENCOUNTER — Telehealth: Payer: Self-pay | Admitting: Physician Assistant

## 2012-09-19 ENCOUNTER — Ambulatory Visit (HOSPITAL_COMMUNITY): Admission: RE | Admit: 2012-09-19 | Payer: Medicare Other | Source: Ambulatory Visit

## 2012-09-19 DIAGNOSIS — E876 Hypokalemia: Secondary | ICD-10-CM

## 2012-09-19 LAB — BASIC METABOLIC PANEL
CO2: 32 mEq/L (ref 19–32)
Calcium: 8.4 mg/dL (ref 8.4–10.5)
Chloride: 93 mEq/L — ABNORMAL LOW (ref 96–112)
Creat: 1.52 mg/dL — ABNORMAL HIGH (ref 0.50–1.10)
Glucose, Bld: 96 mg/dL (ref 70–99)

## 2012-09-19 NOTE — Telephone Encounter (Signed)
Received call from our office lab regarding K of 2.9. Pt has h/o diastolic CHF, CKD. Was sent to ER last night for K of 2.8 and weakness, was instructed to hold demadex/metolazone until further notice, and was given 1 run of KCl and oral KCl (takes BID typically). Current Cr is 1.52. CrCl calculated to 68ml/min. I called the patient - her weakness has greatly improved and she has no acute complaints. She has gained 1 lb but has no acute SOB superimposed on mild chronic unchanged dyspnea. Is able to walk from one end of the house to another without stopping. No CP.  She took her of KCl around 9am today, and the BMET was drawn around 10am showing K 2.9. I suspect it was marginally higher this afternoon.  I advised her to take - KCl now, again in 3 hours - at 6 am - repeat BMET tomorrow at [facility] around 9-10am so that potassium has absorbed - they have renal appt in the morning as well, and are instructed to contact our office after that appointment to find out what to do with her demadex/metolazone which remains on hold for now. This likely needs to be resumed soon with higher component of potassium or discontinuation of metolazone. - f/u appt was moved up to Thurs 4/10 at 2pm (per discussion with Tomi @ [facility]) - pt instructed to proceed to ER if she does feel poorly in the meantime  The patient took these instructions and her husband also wrote them down. They verbalized understanding and gratitude.  Dayna Dunn PA-C

## 2012-09-19 NOTE — Telephone Encounter (Signed)
Noted! Thank you

## 2012-09-19 NOTE — Telephone Encounter (Signed)
Spoke with patient.  States she feels better since receiving K+ and Mg+ supplements in the ER 4/7.  Advised her to have repeat BMET today and depending on results, she may have to come in for an earlier visit, otherwise she should keep her follow up appointment on 4/11 with Joni Reining.  Patient is holding demadex and metolazone today, per instruction of ED physician.

## 2012-09-19 NOTE — Telephone Encounter (Signed)
Patient's husband states that patient was seen in ED on 4/7.  States that ED physician gave them instructions to f/u with nurse in our office. / tgs

## 2012-09-19 NOTE — Telephone Encounter (Signed)
Spoke with Krystal [name], who is on call this evening with new BMET results from today.  We will see her on Thursday at 2 PM and obtain another BMET tomorrow. Krystal [name] to call patient to obtain further information and advise her from there on medication management, also giving her instruction on labs and appointment.

## 2012-09-20 LAB — BASIC METABOLIC PANEL
BUN: 25 mg/dL — ABNORMAL HIGH (ref 6–23)
Calcium: 9 mg/dL (ref 8.4–10.5)
Creat: 1.53 mg/dL — ABNORMAL HIGH (ref 0.50–1.10)

## 2012-09-21 ENCOUNTER — Ambulatory Visit (INDEPENDENT_AMBULATORY_CARE_PROVIDER_SITE_OTHER): Payer: Medicare Other | Admitting: Adult Health

## 2012-09-21 ENCOUNTER — Encounter: Payer: Self-pay | Admitting: Adult Health

## 2012-09-21 VITALS — BP 134/72 | HR 72 | Ht 67.0 in | Wt 275.1 lb

## 2012-09-21 DIAGNOSIS — I5033 Acute on chronic diastolic (congestive) heart failure: Secondary | ICD-10-CM

## 2012-09-21 DIAGNOSIS — E876 Hypokalemia: Secondary | ICD-10-CM

## 2012-09-21 DIAGNOSIS — I509 Heart failure, unspecified: Secondary | ICD-10-CM

## 2012-09-21 DIAGNOSIS — I5032 Chronic diastolic (congestive) heart failure: Secondary | ICD-10-CM

## 2012-09-21 DIAGNOSIS — I4891 Unspecified atrial fibrillation: Secondary | ICD-10-CM

## 2012-09-21 NOTE — Assessment & Plan Note (Signed)
Heart rate is well-controlled. We will not make any adjustments in AV nodal blocking agents at this time.

## 2012-09-21 NOTE — Assessment & Plan Note (Addendum)
This is a difficult situation as the patient eats out a lot do to generalized fatigue about cooking at home. Her son states that he has been trying to cook low salt but does not always adhere to this. Review of her current labs demonstrate a potassium has normalized at 4.5, her creatinine is much better at 1.53 compared to 2.26. I will restart her torsemide and 40 mg daily, will not give potassium replacement at this time as she is on spironolactone. Will not restart metolazone. She is due for followup labs in 5 days. He may need low-dose potassium replacement, or adjustment of Demadex based on labs. I have given her a low-sodium diet, and have asked her to do her best about eating at home more vs. getting take out. Will see her again in 2 weeks, and is to see Dr. Kristian Covey in one week with BMET prior to that appointment.

## 2012-09-21 NOTE — Progress Notes (Deleted)
Name: Krystal [name]    DOB: 09/22/1945  Age: 66 y.o.  MR#: [id number]       PCP:  GOLDING, JOHN CABOT, MD      Insurance: Payor: [employer] [place] [employer]  Plan: BLUE MEDICARE  Product Type: *No Product type*    CC:    Chief Complaint  Patient presents with  . Congestive Heart Failure    Diastolic  . Atrial Fibrillation    VS Filed Vitals:   09/21/12 1405  BP: 134/72  Pulse: 72  Height: 5' 7" (1.702 m)  Weight: 275 lb 1.3 oz (124.775 kg)    Weights Current Weight  09/21/12 275 lb 1.3 oz (124.775 kg)  09/18/12 271 lb (122.925 kg)  09/08/12 289 lb 14.5 oz (131.5 kg)    Blood Pressure  BP Readings from Last 3 Encounters:  09/21/12 134/72  09/18/12 107/53  09/08/12 124/69     Admit date:  (Not on file) Last encounter with RMR:  09/19/2012   Allergy Ace inhibitors; Actos; Aspirin; Celecoxib; Diovan; Metformin and related; and Tylenol  Current Outpatient Prescriptions  Medication Sig Dispense Refill  . ALPRAZolam (XANAX) 0.5 MG tablet Take 0.25 mg by mouth at bedtime.       . atorvastatin (LIPITOR) 20 MG tablet Take 1 tablet (20 mg total) by mouth at bedtime.  30 tablet  6  . Cholecalciferol (VITAMIN D3) 2000 UNITS TABS Take 1 capsule by mouth every morning.       . citalopram (CELEXA) 20 MG tablet Take 20 mg by mouth at bedtime.       . insulin glargine (LANTUS) 100 UNIT/ML injection Inject 0.15 mLs (15 Units total) into the skin at bedtime.  10 mL  0  . insulin lispro (HUMALOG) 100 UNIT/ML injection Inject 5 Units into the skin 3 (three) times daily before meals.  10 mL  12  . metoprolol tartrate (LOPRESSOR) 25 MG tablet Take 0.5 tablets (12.5 mg total) by mouth 2 (two) times daily.  30 tablet  0  . potassium chloride SA (K-DUR,KLOR-CON) 20 MEQ tablet Take 60 mEq by mouth 2 (two) times daily.      . SALINE NASAL MIST NA Place 1 spray into the nose daily as needed (for congestion).       . spironolactone (ALDACTONE) 50 MG tablet Take 50 mg by mouth daily.       . torsemide (DEMADEX) 20 MG tablet Take 40 mg by mouth every morning.      . verapamil (CALAN-SR) 180 MG CR tablet Take 1 tablet (180 mg total) by mouth at bedtime.  30 tablet  6  . metolazone (ZAROXOLYN) 2.5 MG tablet Take 1 tablet (2.5 mg total) by mouth daily as needed. Only takes when patient gains 5 pounds.  10 tablet  1  . warfarin (COUMADIN) 5 MG tablet Take 5-10 mg by mouth daily. To take two tablets (10mg) on Sunday, Tuesdays, and Thursdays, then take one tablet (5mg)on all other days.       No current facility-administered medications for this visit.    Discontinued Meds:    Medications Discontinued During This Encounter  Medication Reason  . potassium chloride SA (K-DUR,KLOR-CON) 20 MEQ tablet     Patient Active Problem List  Diagnosis  . DIABETES MELLITUS, TYPE II  . OBESITY  . HYPERTENSION  . HYPERTENSIVE CARDIOVASCULAR DISEASE, WITH CHF  . DYSPNEA  . Syncope and collapse  . Renal failure, acute  . Atrial fibrillation  .   Right heart failure  . HTN (hypertension)  . Anemia, iron deficiency  . Thrombocytopenia  . Diastolic CHF, acute on chronic  . Hyponatremia  . Conjunctival hemorrhage of left eye  . Encounter for long-term (current) use of anticoagulants  . Chronic diastolic heart failure  . UTI (urinary tract infection)  . Hypokalemia  . Weakness  . CKD (chronic kidney disease), stage IV  . Nonalcoholic fatty liver disease  . Sepsis  . Acute renal failure    LABS    Component Value Date/Time   NA 138 09/19/2012 1721   NA 136 09/19/2012 1014   NA 135 09/18/2012 1531   K 4.5 09/19/2012 1721   K 2.9* 09/19/2012 1014   K 2.8* 09/18/2012 1531   CL 96 09/19/2012 1721   CL 93* 09/19/2012 1014   CL 90* 09/18/2012 1531   CO2 32 09/19/2012 1721   CO2 32 09/19/2012 1014   CO2 34* 09/18/2012 1531   GLUCOSE 151* 09/19/2012 1721   GLUCOSE 96 09/19/2012 1014   GLUCOSE 121* 09/18/2012 1531   BUN 25* 09/19/2012 1721   BUN 26* 09/19/2012 1014   BUN 28* 09/18/2012 1531   CREATININE 1.53*  09/19/2012 1721   CREATININE 1.52* 09/19/2012 1014   CREATININE 1.69* 09/18/2012 1531   CREATININE 1.83* 09/08/2012 0449   CREATININE 2.00* 09/07/2012 0437   CREATININE 1.42* 08/17/2011 1115   CALCIUM 9.0 09/19/2012 1721   CALCIUM 8.4 09/19/2012 1014   CALCIUM 8.8 09/18/2012 1531   GFRNONAA 31* 09/18/2012 1531   GFRNONAA 28* 09/08/2012 0449   GFRNONAA 25* 09/07/2012 0437   GFRAA 36* 09/18/2012 1531   GFRAA 32* 09/08/2012 0449   GFRAA 29* 09/07/2012 0437   CMP     Component Value Date/Time   NA 138 09/19/2012 1721   K 4.5 09/19/2012 1721   CL 96 09/19/2012 1721   CO2 32 09/19/2012 1721   GLUCOSE 151* 09/19/2012 1721   BUN 25* 09/19/2012 1721   CREATININE 1.53* 09/19/2012 1721   CREATININE 1.69* 09/18/2012 1531   CALCIUM 9.0 09/19/2012 1721   PROT 6.2 09/05/2012 0441   ALBUMIN 2.6* 09/05/2012 0441   AST 86* 09/05/2012 0441   ALT 25 09/05/2012 0441   ALKPHOS 76 09/05/2012 0441   BILITOT 1.5* 09/05/2012 0441   GFRNONAA 31* 09/18/2012 1531   GFRAA 36* 09/18/2012 1531       Component Value Date/Time   WBC 6.5 09/18/2012 1531   WBC 6.9 09/08/2012 0449   WBC 8.4 09/07/2012 0437   HGB 9.0* 09/18/2012 1531   HGB 9.7* 09/08/2012 0449   HGB 9.5* 09/07/2012 0437   HCT 28.7* 09/18/2012 1531   HCT 31.5* 09/08/2012 0449   HCT 30.0* 09/07/2012 0437   MCV 87.8 09/18/2012 1531   MCV 89.7 09/08/2012 0449   MCV 88.5 09/07/2012 0437    Lipid Panel     Component Value Date/Time   CHOL 106 05/16/2009 2012   TRIG 108 05/16/2009 2012   HDL 30* 05/16/2009 2012   CHOLHDL 3.5 Ratio 05/16/2009 2012   VLDL 22 05/16/2009 2012   LDLCALC 54 05/16/2009 2012    ABG    Component Value Date/Time   PHART 7.485* 07/28/2011 0455   PCO2ART 46.4* 07/28/2011 0455   PO2ART 86.0 07/28/2011 0455   HCO3 34.6* 07/28/2011 0455   TCO2 24 08/05/2011 1121   O2SAT 97.0 07/28/2011 0455     Lab Results  Component Value Date   TSH 1.553 07/22/2011   BNP (last 3 results) No   results found for this basename: PROBNP,  in the last 8760 hours Cardiac Panel (last 3 results) No  results found for this basename: CKTOTAL, CKMB, TROPONINI, RELINDX,  in the last 72 hours  Iron/TIBC/Ferritin    Component Value Date/Time   IRON 35* 07/23/2011 1756   TIBC 330 07/23/2011 1756   FERRITIN 120 07/23/2011 1756     EKG Orders placed in visit on 09/21/12  . EKG 12-LEAD     Prior Assessment and Plan Problem List as of 09/21/2012     ICD-9-CM     Cardiology Problems   HYPERTENSION   Last Assessment & Plan   03/15/2012 Office Visit Written 03/15/2012  1:34 PM by Michele M Lenze, PA     Controlled    HYPERTENSIVE CARDIOVASCULAR DISEASE, WITH CHF   Last Assessment & Plan   01/27/2011 Office Visit Written 01/27/2011  3:01 PM by Michele M Lenze, PA     Blood pressure is elevated today. She is not orthostatic. I talked to her about 2 g sodium diet. She will continue to have her blood pressure monitored at the [facility] clinic. They can make adjustments as needed.    Syncope and collapse   Last Assessment & Plan   01/27/2011 Office Visit Written 01/27/2011  3:01 PM by Michele M Lenze, PA     Patient had an episode of syncope while returning her grocery cart 3 weeks ago. Workup in the ER was negative except for hemoglobin of 10.7. She is having her CBC repeated today. Patient does have a carotid bruit, Systolic murmur, with history of moderate MR 2 years ago, and should have carotid Dopplers, 2-D echo, and stress Myoview. 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 She does not turned 66 until next year. She is quite tearful about this situation but truly cannot afford to have these tests performed.    Atrial fibrillation   Last Assessment & Plan   08/24/2012 Office Visit Written 08/24/2012  4:35 PM by Kathryn M Lawrence, NP     Review of EKG demonstrates atrial flutter with controlled rate of 72 beats per minute. Will not make any changes in her medications at this time. She will continue  taking Xarelto as directed.    Right heart failure   HTN (hypertension)   Last Assessment & Plan   08/24/2012 Office Visit Written 08/24/2012  4:35 PM by Kathryn M Lawrence, NP     Excellent control of blood pressure. We will continue to monitor closely after she had some diuresis using metolazone. To avoid dehydration or near syncope.    Diastolic CHF, acute on chronic   Last Assessment & Plan   08/24/2012 Office Visit Written 08/24/2012  4:34 PM by Kathryn M Lawrence, NP     Review of recent weights and trends demonstrate that she has gained 25 pounds since October of 2013, with an overall 40 pound weight gain since last year. She admits to ) noncompliance, has not been weighing herself daily in order to evaluate the need to take a dose of metolazone for 5 pound weight gain. She has been having some complaints of PND and dyspnea on exertion she is also morbidly obese. I am seeing some evidence of fluid retention in the lower extremities with 2+ to 1+ pretibial edema more so in her feet. She does have some mild distention of her abdomen. I decided to   give her 2 doses of metolazone over the next 2 days with continued torsemide dose as she has been taking. She will return to her regular regimen thereafter. She is to begin weighing herself daily and avoiding salt again. She admits she has been very sedentary over the winter and has not been as active as she normally is. Now that the weather is better I have encouraged her to become more active again. We will see her again in 2 weeks she will have a BMET in 5 days.    Chronic diastolic heart failure   Last Assessment & Plan   03/15/2012 Office Visit Edited 03/15/2012  1:36 PM by Michele M Lenze, PA     Patient's heart failure is controlled. She is up a couple pounds and will take an extra Zaroxolyn in the morning. There is no evidence of heart failure on exam today.She is followed by the heart failure clinic. She can see Dr. Robert back in 6 months.       Other   DIABETES MELLITUS, TYPE II   Last Assessment & Plan   11/03/2011 Office Visit Written 11/03/2011 11:14 AM by Michele M Lenze, PA     Patient sugars have been running on the high side recently and she is scheduled to see an endocrinologist next month.    OBESITY   DYSPNEA   Renal failure, acute   Anemia, iron deficiency   Last Assessment & Plan   08/10/2011 Office Visit Written 08/10/2011  4:25 PM by Kathryn M Lawrence, NP     Most recent Hbg 11.9 with Hct 35.0. She will continue iron replacement and follow-up with PCP. No active bleeding.    Thrombocytopenia   Hyponatremia   Conjunctival hemorrhage of left eye   Encounter for long-term (current) use of anticoagulants   UTI (urinary tract infection)   Hypokalemia   Weakness   CKD (chronic kidney disease), stage IV   Nonalcoholic fatty liver disease   Sepsis   Acute renal failure       Imaging: Dg Chest 2 View  09/16/2012  *RADIOLOGY REPORT*  Clinical Data: Cough and congestion.  CHEST - 2 VIEW  Comparison: 09/02/2012 portable x-ray and multiple other chest x- rays dating back to 2008.  Findings: A component of bronchial thickening is suspected bilaterally in a perihilar distribution.  Soft tissue prominence present in the left hilum.  This is more prominent compared to prior chest x-rays and suggest the possibility of a central infiltrate or mass.  Recommend follow-up radiographs with consideration for chest CT should there be a persistent abnormality.  No overt pulmonary edema is identified.  No evidence of pleural fluid.  Bony structures are unremarkable.  IMPRESSION: Central bronchial thickening bilaterally.  Soft tissue prominence in the left hilar region suggestive of central infiltrate or mass. Recommend follow-up chest radiographs.   Original Report Authenticated By: Glenn Yamagata, M.D.    [facility]  09/04/2012  *RADIOLOGY REPORT*  Clinical Data: Sepsis and acute renal failure.  History of chronic kidney disease.   RENAL/URINARY TRACT ULTRASOUND COMPLETE  Comparison:  06/15/2011  Findings:  Right Kidney:  The right kidney measures 12.2 cm in length without hydronephrosis.  Renal echogenicity is within normal limits.  Left Kidney:  Left kidney measures 12.9 cm in length without hydronephrosis.  Normal left renal echogenicity.  Bladder:  Normal appearance of the urinary bladder.  Other findings:  There is perihepatic ascites.  IMPRESSION: Normal appearance of both kidneys without hydronephrosis.  Ascites.     Original Report Authenticated By: Adam Henn, M.D.    Dg Chest Portable 1 View  09/02/2012  *RADIOLOGY REPORT*  Clinical Data: Nausea, vomiting, diarrhea  PORTABLE CHEST - 1 VIEW  Comparison: 07/25/2011  Findings: Study is limited by patient's large body habitus and poor inspiration.  Cardiomegaly again noted.  Central vascular congestion without convincing pulmonary edema.  Bilateral basilar atelectasis or infiltrate.  IMPRESSION: Limited study as described above.  Central vascular congestion without convincing pulmonary edema.  Bilateral basilar atelectasis or infiltrate.  Cardiomegaly again noted.   Original Report Authenticated By: Liviu Pop, M.D.           

## 2012-09-21 NOTE — Assessment & Plan Note (Signed)
Potassium has gotten much better at 4.5. She was given potassium replacement in the ER. Now that she is on spironolactone we will not add potassium replacement until followup labs are completed.

## 2012-09-21 NOTE — Progress Notes (Deleted)
Name: Krystal [name]    DOB: Sep 27, 1945  Age: 67 y.o.  MR#: [id number]       PCP:  Colette Ribas, MD      Insurance: Payor: BLUE CROSS BLUE SHIELD OF  MEDICARE  Plan: BLUE MEDICARE  Product Type: *No Product type*    CC:    Chief Complaint  Patient presents with  . Congestive Heart Failure    Diastolic  . Atrial Fibrillation    VS Filed Vitals:   09/21/12 1405  BP: 134/72  Pulse: 72  Height: 5\' 7"  (1.702 m)  Weight: 275 lb 1.3 oz (124.775 kg)    Weights Current Weight  09/21/12 275 lb 1.3 oz (124.775 kg)  09/18/12 271 lb (122.925 kg)  09/08/12 289 lb 14.5 oz (131.5 kg)    Blood Pressure  BP Readings from Last 3 Encounters:  09/21/12 134/72  09/18/12 107/53  09/08/12 124/69     Admit date:  (Not on file) Last encounter with RMR:  09/19/2012   Allergy Ace inhibitors; Actos; Aspirin; Celecoxib; Diovan; Metformin and related; and Tylenol  Current Outpatient Prescriptions  Medication Sig Dispense Refill  . ALPRAZolam (XANAX) 0.5 MG tablet Take 0.25 mg by mouth at bedtime.       Marland Kitchen atorvastatin (LIPITOR) 20 MG tablet Take 1 tablet (20 mg total) by mouth at bedtime.  30 tablet  6  . Cholecalciferol (VITAMIN D3) 2000 UNITS TABS Take 1 capsule by mouth every morning.       . citalopram (CELEXA) 20 MG tablet Take 20 mg by mouth at bedtime.       . insulin glargine (LANTUS) 100 UNIT/ML injection Inject 0.15 mLs (15 Units total) into the skin at bedtime.  10 mL  0  . insulin lispro (HUMALOG) 100 UNIT/ML injection Inject 5 Units into the skin 3 (three) times daily before meals.  10 mL  12  . metoprolol tartrate (LOPRESSOR) 25 MG tablet Take 0.5 tablets (12.5 mg total) by mouth 2 (two) times daily.  30 tablet  0  . potassium chloride SA (K-DUR,KLOR-CON) 20 MEQ tablet Take 60 mEq by mouth 2 (two) times daily.      Marland Kitchen SALINE NASAL MIST NA Place 1 spray into the nose daily as needed (for congestion).       Marland Kitchen spironolactone (ALDACTONE) 50 MG tablet Take 50 mg by mouth daily.       Marland Kitchen torsemide (DEMADEX) 20 MG tablet Take 40 mg by mouth every morning.      . verapamil (CALAN-SR) 180 MG CR tablet Take 1 tablet (180 mg total) by mouth at bedtime.  30 tablet  6  . metolazone (ZAROXOLYN) 2.5 MG tablet Take 1 tablet (2.5 mg total) by mouth daily as needed. Only takes when patient gains 5 pounds.  10 tablet  1  . warfarin (COUMADIN) 5 MG tablet Take 5-10 mg by mouth daily. To take two tablets (10mg ) on Sunday, Tuesdays, and Thursdays, then take one tablet (5mg )on all other days.       No current facility-administered medications for this visit.    Discontinued Meds:    Medications Discontinued During This Encounter  Medication Reason  . potassium chloride SA (K-DUR,KLOR-CON) 20 MEQ tablet     Patient Active Problem List  Diagnosis  . DIABETES MELLITUS, TYPE II  . OBESITY  . HYPERTENSION  . HYPERTENSIVE CARDIOVASCULAR DISEASE, WITH CHF  . DYSPNEA  . Syncope and collapse  . Renal failure, acute  . Atrial fibrillation  .  Right heart failure  . HTN (hypertension)  . Anemia, iron deficiency  . Thrombocytopenia  . Diastolic CHF, acute on chronic  . Hyponatremia  . Conjunctival hemorrhage of left eye  . Encounter for long-term (current) use of anticoagulants  . Chronic diastolic heart failure  . UTI (urinary tract infection)  . Hypokalemia  . Weakness  . CKD (chronic kidney disease), stage IV  . Nonalcoholic fatty liver disease  . Sepsis  . Acute renal failure    LABS    Component Value Date/Time   NA 138 09/19/2012 1721   NA 136 09/19/2012 1014   NA 135 09/18/2012 1531   K 4.5 09/19/2012 1721   K 2.9* 09/19/2012 1014   K 2.8* 09/18/2012 1531   CL 96 09/19/2012 1721   CL 93* 09/19/2012 1014   CL 90* 09/18/2012 1531   CO2 32 09/19/2012 1721   CO2 32 09/19/2012 1014   CO2 34* 09/18/2012 1531   GLUCOSE 151* 09/19/2012 1721   GLUCOSE 96 09/19/2012 1014   GLUCOSE 121* 09/18/2012 1531   BUN 25* 09/19/2012 1721   BUN 26* 09/19/2012 1014   BUN 28* 09/18/2012 1531   CREATININE 1.53*  09/19/2012 1721   CREATININE 1.52* 09/19/2012 1014   CREATININE 1.69* 09/18/2012 1531   CREATININE 1.83* 09/08/2012 0449   CREATININE 2.00* 09/07/2012 0437   CREATININE 1.42* 08/17/2011 1115   CALCIUM 9.0 09/19/2012 1721   CALCIUM 8.4 09/19/2012 1014   CALCIUM 8.8 09/18/2012 1531   GFRNONAA 31* 09/18/2012 1531   GFRNONAA 28* 09/08/2012 0449   GFRNONAA 25* 09/07/2012 0437   GFRAA 36* 09/18/2012 1531   GFRAA 32* 09/08/2012 0449   GFRAA 29* 09/07/2012 0437   CMP     Component Value Date/Time   NA 138 09/19/2012 1721   K 4.5 09/19/2012 1721   CL 96 09/19/2012 1721   CO2 32 09/19/2012 1721   GLUCOSE 151* 09/19/2012 1721   BUN 25* 09/19/2012 1721   CREATININE 1.53* 09/19/2012 1721   CREATININE 1.69* 09/18/2012 1531   CALCIUM 9.0 09/19/2012 1721   PROT 6.2 09/05/2012 0441   ALBUMIN 2.6* 09/05/2012 0441   AST 86* 09/05/2012 0441   ALT 25 09/05/2012 0441   ALKPHOS 76 09/05/2012 0441   BILITOT 1.5* 09/05/2012 0441   GFRNONAA 31* 09/18/2012 1531   GFRAA 36* 09/18/2012 1531       Component Value Date/Time   WBC 6.5 09/18/2012 1531   WBC 6.9 09/08/2012 0449   WBC 8.4 09/07/2012 0437   HGB 9.0* 09/18/2012 1531   HGB 9.7* 09/08/2012 0449   HGB 9.5* 09/07/2012 0437   HCT 28.7* 09/18/2012 1531   HCT 31.5* 09/08/2012 0449   HCT 30.0* 09/07/2012 0437   MCV 87.8 09/18/2012 1531   MCV 89.7 09/08/2012 0449   MCV 88.5 09/07/2012 0437    Lipid Panel     Component Value Date/Time   CHOL 106 05/16/2009 2012   TRIG 108 05/16/2009 2012   HDL 30* 05/16/2009 2012   CHOLHDL 3.5 Ratio 05/16/2009 2012   VLDL 22 05/16/2009 2012   LDLCALC 54 05/16/2009 2012    ABG    Component Value Date/Time   PHART 7.485* 07/28/2011 0455   PCO2ART 46.4* 07/28/2011 0455   PO2ART 86.0 07/28/2011 0455   HCO3 34.6* 07/28/2011 0455   TCO2 24 08/05/2011 1121   O2SAT 97.0 07/28/2011 0455     Lab Results  Component Value Date   TSH 1.553 07/22/2011   BNP (last 3 results) No  results found for this basename: PROBNP,  in the last 8760 hours Cardiac Panel (last 3 results) No  results found for this basename: CKTOTAL, CKMB, TROPONINI, RELINDX,  in the last 72 hours  Iron/TIBC/Ferritin    Component Value Date/Time   IRON 35* 07/23/2011 1756   TIBC 330 07/23/2011 1756   FERRITIN 120 07/23/2011 1756     EKG Orders placed in visit on 09/21/12  . EKG 12-LEAD     Prior Assessment and Plan Problem List as of 09/21/2012     ICD-9-CM     Cardiology Problems   HYPERTENSION   Last Assessment & Plan   03/15/2012 Office Visit Written 03/15/2012  1:34 PM by Dyann Kief, PA     Controlled    HYPERTENSIVE CARDIOVASCULAR DISEASE, WITH CHF   Last Assessment & Plan   01/27/2011 Office Visit Written 01/27/2011  3:01 PM by Dyann Kief, PA     Blood pressure is elevated today. She is not orthostatic. I talked to her about 2 g sodium diet. She will continue to have her blood pressure monitored at the free clinic. They can make adjustments as needed.    Syncope and collapse   Last Assessment & Plan   01/27/2011 Office Visit Written 01/27/2011  3:01 PM by Dyann Kief, PA     Patient had an episode of syncope while returning her grocery cart 3 weeks ago. Workup in the ER was negative except for hemoglobin of 10.7. She is having her CBC repeated today. Patient does have a carotid bruit, Systolic murmur, with history of moderate MR 2 years ago, and should have carotid Dopplers, 2-D echo, and stress Myoview. 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 She does not turned 66 until next year. She is quite tearful about this situation but truly cannot afford to have these tests performed.    Atrial fibrillation   Last Assessment & Plan   08/24/2012 Office Visit Written 08/24/2012  4:35 PM by Jodelle Gross, NP     Review of EKG demonstrates atrial flutter with controlled rate of 72 beats per minute. Will not make any changes in her medications at this time. She will continue  taking Xarelto as directed.    Right heart failure   HTN (hypertension)   Last Assessment & Plan   08/24/2012 Office Visit Written 08/24/2012  4:35 PM by Jodelle Gross, NP     Excellent control of blood pressure. We will continue to monitor closely after she had some diuresis using metolazone. To avoid dehydration or near syncope.    Diastolic CHF, acute on chronic   Last Assessment & Plan   08/24/2012 Office Visit Written 08/24/2012  4:34 PM by Jodelle Gross, NP     Review of recent weights and trends demonstrate that she has gained 25 pounds since October of 2013, with an overall 40 pound weight gain since last year. She admits to ) noncompliance, has not been weighing herself daily in order to evaluate the need to take a dose of metolazone for 5 pound weight gain. She has been having some complaints of PND and dyspnea on exertion she is also morbidly obese. I am seeing some evidence of fluid retention in the lower extremities with 2+ to 1+ pretibial edema more so in her feet. She does have some mild distention of her abdomen. I decided to  give her 2 doses of metolazone over the next 2 days with continued torsemide dose as she has been taking. She will return to her regular regimen thereafter. She is to begin weighing herself daily and avoiding salt again. She admits she has been very sedentary over the winter and has not been as active as she normally is. Now that the weather is better I have encouraged her to become more active again. We will see her again in 2 weeks she will have a BMET in 5 days.    Chronic diastolic heart failure   Last Assessment & Plan   03/15/2012 Office Visit Edited 03/15/2012  1:36 PM by Dyann Kief, PA     Patient's heart failure is controlled. She is up a couple pounds and will take an extra Zaroxolyn in the morning. There is no evidence of heart failure on exam today.She is followed by the heart failure clinic. She can see Dr. Molly Maduro back in 6 months.       Other   DIABETES MELLITUS, TYPE II   Last Assessment & Plan   11/03/2011 Office Visit Written 11/03/2011 11:14 AM by Dyann Kief, PA     Patient sugars have been running on the high side recently and she is scheduled to see an endocrinologist next month.    OBESITY   DYSPNEA   Renal failure, acute   Anemia, iron deficiency   Last Assessment & Plan   08/10/2011 Office Visit Written 08/10/2011  4:25 PM by Jodelle Gross, NP     Most recent Hbg 11.9 with Hct 35.0. She will continue iron replacement and follow-up with PCP. No active bleeding.    Thrombocytopenia   Hyponatremia   Conjunctival hemorrhage of left eye   Encounter for long-term (current) use of anticoagulants   UTI (urinary tract infection)   Hypokalemia   Weakness   CKD (chronic kidney disease), stage IV   Nonalcoholic fatty liver disease   Sepsis   Acute renal failure       Imaging: Dg Chest 2 View  09/16/2012  *RADIOLOGY REPORT*  Clinical Data: Cough and congestion.  CHEST - 2 VIEW  Comparison: 09/02/2012 portable x-ray and multiple other chest x- rays dating back to 2008.  Findings: A component of bronchial thickening is suspected bilaterally in a perihilar distribution.  Soft tissue prominence present in the left hilum.  This is more prominent compared to prior chest x-rays and suggest the possibility of a central infiltrate or mass.  Recommend follow-up radiographs with consideration for chest CT should there be a persistent abnormality.  No overt pulmonary edema is identified.  No evidence of pleural fluid.  Bony structures are unremarkable.  IMPRESSION: Central bronchial thickening bilaterally.  Soft tissue prominence in the left hilar region suggestive of central infiltrate or mass. Recommend follow-up chest radiographs.   Original Report Authenticated By: Irish Lack, M.D.    US Renal  09/04/2012  *RADIOLOGY REPORT*  Clinical Data: Sepsis and acute renal failure.  History of chronic kidney disease.   RENAL/URINARY TRACT ULTRASOUND COMPLETE  Comparison:  06/15/2011  Findings:  Right Kidney:  The right kidney measures 12.2 cm in length without hydronephrosis.  Renal echogenicity is within normal limits.  Left Kidney:  Left kidney measures 12.9 cm in length without hydronephrosis.  Normal left renal echogenicity.  Bladder:  Normal appearance of the urinary bladder.  Other findings:  There is perihepatic ascites.  IMPRESSION: Normal appearance of both kidneys without hydronephrosis.  Ascites.  Original Report Authenticated By: Richarda Overlie, M.D.    Dg Chest Portable 1 View  09/02/2012  *RADIOLOGY REPORT*  Clinical Data: Nausea, vomiting, diarrhea  PORTABLE CHEST - 1 VIEW  Comparison: 07/25/2011  Findings: Study is limited by patient's large body habitus and poor inspiration.  Cardiomegaly again noted.  Central vascular congestion without convincing pulmonary edema.  Bilateral basilar atelectasis or infiltrate.  IMPRESSION: Limited study as described above.  Central vascular congestion without convincing pulmonary edema.  Bilateral basilar atelectasis or infiltrate.  Cardiomegaly again noted.   Original Report Authenticated By: Natasha Mead, M.D.

## 2012-09-21 NOTE — Patient Instructions (Addendum)
Stop taking: Metolazone & potassium  Continue taking: spironolactone  Restart: Torsemide 40 mg 1 tablet daily  You will need to go to the lab on 09/26/12 for a BMP.  You do not have to be fasting for this lab. We will call you with your results.  Follow-up with Krystal [name] in 2 weeks  You have been given information to follow a low sodium ( low salt) diet.  Following a diet lower in salt can help with the swelling and fluid weight gain.
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
## 2012-09-22 ENCOUNTER — Ambulatory Visit (INDEPENDENT_AMBULATORY_CARE_PROVIDER_SITE_OTHER): Payer: Medicare Other | Admitting: *Deleted

## 2012-09-22 DIAGNOSIS — Z7901 Long term (current) use of anticoagulants: Secondary | ICD-10-CM

## 2012-09-22 DIAGNOSIS — I4891 Unspecified atrial fibrillation: Secondary | ICD-10-CM

## 2012-09-25 ENCOUNTER — Ambulatory Visit: Payer: Medicare Other | Admitting: Adult Health

## 2012-09-27 ENCOUNTER — Encounter: Payer: Self-pay | Admitting: *Deleted

## 2012-10-05 ENCOUNTER — Ambulatory Visit (INDEPENDENT_AMBULATORY_CARE_PROVIDER_SITE_OTHER): Payer: Medicare Other | Admitting: *Deleted

## 2012-10-05 ENCOUNTER — Ambulatory Visit (INDEPENDENT_AMBULATORY_CARE_PROVIDER_SITE_OTHER): Payer: Medicare Other | Admitting: Adult Health

## 2012-10-05 ENCOUNTER — Encounter: Payer: Self-pay | Admitting: Adult Health

## 2012-10-05 VITALS — BP 119/73 | HR 70 | Ht 67.0 in | Wt 274.0 lb

## 2012-10-05 DIAGNOSIS — I5033 Acute on chronic diastolic (congestive) heart failure: Secondary | ICD-10-CM

## 2012-10-05 DIAGNOSIS — Z7901 Long term (current) use of anticoagulants: Secondary | ICD-10-CM

## 2012-10-05 DIAGNOSIS — I4891 Unspecified atrial fibrillation: Secondary | ICD-10-CM

## 2012-10-05 DIAGNOSIS — I5032 Chronic diastolic (congestive) heart failure: Secondary | ICD-10-CM

## 2012-10-05 LAB — POCT INR: INR: 3.2

## 2012-10-05 MED ORDER — SPIRONOLACTONE 50 MG PO TABS: 50.0000 mg | ORAL_TABLET | Freq: Every day | ORAL | Status: DC

## 2012-10-05 MED ORDER — TORSEMIDE 20 MG PO TABS: 40.0000 mg | ORAL_TABLET | Freq: Every morning | ORAL | Status: DC

## 2012-10-05 NOTE — Assessment & Plan Note (Signed)
She has had her Lasix changed to Demadex by Dr. Kristian Covey, but unfortunately has not been taking it as directed. The patient should be on 40 mg daily but is only taking 20. Her weight has not changed she continues dyspneic on exertion. Overall assessment does not show evidence of significant fluid overload. She does have some mild dependent edema with some right basilar crackles only. I have advised her to take the Demadex as directed and to weigh herself daily. She is due to followup in the Coumadin clinic on May 21 vascular for her weights with her at that time. The patient will be seen in 3 months with a BMET scheduled for 2 weeks.

## 2012-10-05 NOTE — Progress Notes (Deleted)
Name: Krystal [name]    DOB: Jan 05, 1946  Age: 67 y.o.  MR#: [id number]       PCP:  Colette Ribas, MD      Insurance: Payor: [employer] [place] [employer]  Plan: BLUE MEDICARE  Product Type: *No Product type*    CC:    Chief Complaint  Patient presents with  . Atrial Fibrillation    VS Filed Vitals:   10/05/12 1325  BP: 119/73  Pulse: 70  Height: 5\' 7"  (1.702 m)  Weight: 274 lb (124.286 kg)    Weights Current Weight  10/05/12 274 lb (124.286 kg)  09/21/12 275 lb 1.3 oz (124.775 kg)  09/18/12 271 lb (122.925 kg)    Blood Pressure  BP Readings from Last 3 Encounters:  10/05/12 119/73  09/21/12 134/72  09/18/12 107/53     Admit date:  (Not on file) Last encounter with RMR:  09/25/2012   Allergy Ace inhibitors; Actos; Aspirin; Celecoxib; Diovan; Metformin and related; and Tylenol  Current Outpatient Prescriptions  Medication Sig Dispense Refill  . ALPRAZolam (XANAX) 0.5 MG tablet Take 0.25 mg by mouth at bedtime.       Marland Kitchen atorvastatin (LIPITOR) 20 MG tablet Take 1 tablet (20 mg total) by mouth at bedtime.  30 tablet  6  . Cholecalciferol (VITAMIN D3) 2000 UNITS TABS Take 1 capsule by mouth every morning.       . citalopram (CELEXA) 20 MG tablet Take 20 mg by mouth at bedtime.       . insulin glargine (LANTUS) 100 UNIT/ML injection Inject 0.15 mLs (15 Units total) into the skin at bedtime.  10 mL  0  . insulin lispro (HUMALOG) 100 UNIT/ML injection Inject 5 Units into the skin 3 (three) times daily before meals.  10 mL  12  . KLOR-CON M20 20 MEQ tablet       . metoprolol tartrate (LOPRESSOR) 25 MG tablet Take 0.5 tablets (12.5 mg total) by mouth 2 (two) times daily.  30 tablet  0  . NOVOFINE 30G X 8 MM MISC       . SALINE NASAL MIST NA Place 1 spray into the nose daily as needed (for congestion).       Marland Kitchen spironolactone (ALDACTONE) 50 MG tablet Take 50 mg by mouth daily.      Marland Kitchen torsemide (DEMADEX) 20 MG tablet Take 40 mg by mouth every morning.      .  verapamil (CALAN-SR) 180 MG CR tablet Take 1 tablet (180 mg total) by mouth at bedtime.  30 tablet  6  . warfarin (COUMADIN) 5 MG tablet Take 5-10 mg by mouth daily. To take two tablets (10mg ) on Sunday, Tuesdays, and Thursdays, then take one tablet (5mg )on all other days.       No current facility-administered medications for this visit.    Discontinued Meds:   There are no discontinued medications.  Patient Active Problem List  Diagnosis  . DIABETES MELLITUS, TYPE II  . OBESITY  . HYPERTENSION  . HYPERTENSIVE CARDIOVASCULAR DISEASE, WITH CHF  . DYSPNEA  . Syncope and collapse  . Renal failure, acute  . Atrial fibrillation  . Right heart failure  . HTN (hypertension)  . Anemia, iron deficiency  . Thrombocytopenia  . Diastolic CHF, acute on chronic  . Hyponatremia  . Conjunctival hemorrhage of left eye  . Encounter for long-term (current) use of anticoagulants  . Chronic diastolic heart failure  . UTI (urinary tract infection)  . Hypokalemia  .  Weakness  . CKD (chronic kidney disease), stage IV  . Nonalcoholic fatty liver disease  . Sepsis  . Acute renal failure    LABS    Component Value Date/Time   NA 138 09/19/2012 1721   NA 136 09/19/2012 1014   NA 135 09/18/2012 1531   K 4.5 09/19/2012 1721   K 2.9* 09/19/2012 1014   K 2.8* 09/18/2012 1531   CL 96 09/19/2012 1721   CL 93* 09/19/2012 1014   CL 90* 09/18/2012 1531   CO2 32 09/19/2012 1721   CO2 32 09/19/2012 1014   CO2 34* 09/18/2012 1531   GLUCOSE 151* 09/19/2012 1721   GLUCOSE 96 09/19/2012 1014   GLUCOSE 121* 09/18/2012 1531   BUN 25* 09/19/2012 1721   BUN 26* 09/19/2012 1014   BUN 28* 09/18/2012 1531   CREATININE 1.53* 09/19/2012 1721   CREATININE 1.52* 09/19/2012 1014   CREATININE 1.69* 09/18/2012 1531   CREATININE 1.83* 09/08/2012 0449   CREATININE 2.00* 09/07/2012 0437   CREATININE 1.42* 08/17/2011 1115   CALCIUM 9.0 09/19/2012 1721   CALCIUM 8.4 09/19/2012 1014   CALCIUM 8.8 09/18/2012 1531   GFRNONAA 31* 09/18/2012 1531   GFRNONAA 28*  09/08/2012 0449   GFRNONAA 25* 09/07/2012 0437   GFRAA 36* 09/18/2012 1531   GFRAA 32* 09/08/2012 0449   GFRAA 29* 09/07/2012 0437   CMP     Component Value Date/Time   NA 138 09/19/2012 1721   K 4.5 09/19/2012 1721   CL 96 09/19/2012 1721   CO2 32 09/19/2012 1721   GLUCOSE 151* 09/19/2012 1721   BUN 25* 09/19/2012 1721   CREATININE 1.53* 09/19/2012 1721   CREATININE 1.69* 09/18/2012 1531   CALCIUM 9.0 09/19/2012 1721   PROT 6.2 09/05/2012 0441   ALBUMIN 2.6* 09/05/2012 0441   AST 86* 09/05/2012 0441   ALT 25 09/05/2012 0441   ALKPHOS 76 09/05/2012 0441   BILITOT 1.5* 09/05/2012 0441   GFRNONAA 31* 09/18/2012 1531   GFRAA 36* 09/18/2012 1531       Component Value Date/Time   WBC 6.5 09/18/2012 1531   WBC 6.9 09/08/2012 0449   WBC 8.4 09/07/2012 0437   HGB 9.0* 09/18/2012 1531   HGB 9.7* 09/08/2012 0449   HGB 9.5* 09/07/2012 0437   HCT 28.7* 09/18/2012 1531   HCT 31.5* 09/08/2012 0449   HCT 30.0* 09/07/2012 0437   MCV 87.8 09/18/2012 1531   MCV 89.7 09/08/2012 0449   MCV 88.5 09/07/2012 0437    Lipid Panel     Component Value Date/Time   CHOL 106 05/16/2009 2012   TRIG 108 05/16/2009 2012   HDL 30* 05/16/2009 2012   CHOLHDL 3.5 Ratio 05/16/2009 2012   VLDL 22 05/16/2009 2012   LDLCALC 54 05/16/2009 2012    ABG    Component Value Date/Time   PHART 7.485* 07/28/2011 0455   PCO2ART 46.4* 07/28/2011 0455   PO2ART 86.0 07/28/2011 0455   HCO3 34.6* 07/28/2011 0455   TCO2 24 08/05/2011 1121   O2SAT 97.0 07/28/2011 0455     Lab Results  Component Value Date   TSH 1.553 07/22/2011   BNP (last 3 results) No results found for this basename: PROBNP,  in the last 8760 hours Cardiac Panel (last 3 results) No results found for this basename: CKTOTAL, CKMB, TROPONINI, RELINDX,  in the last 72 hours  Iron/TIBC/Ferritin    Component Value Date/Time   IRON 35* 07/23/2011 1756   TIBC 330 07/23/2011 1756   FERRITIN 120 07/23/2011  1756     EKG Orders placed in visit on 09/21/12  . EKG 12-LEAD     Prior Assessment and  Plan Problem List as of 10/05/2012     ICD-9-CM   DIABETES MELLITUS, TYPE II   Last Assessment & Plan   11/03/2011 Office Visit Written 11/03/2011 11:14 AM by Dyann Kief, PA     Patient sugars have been running on the high side recently and she is scheduled to see an endocrinologist next month.    OBESITY   HYPERTENSION   Last Assessment & Plan   03/15/2012 Office Visit Written 03/15/2012  1:34 PM by Dyann Kief, PA     Controlled    HYPERTENSIVE CARDIOVASCULAR DISEASE, WITH CHF   Last Assessment & Plan   01/27/2011 Office Visit Written 01/27/2011  3:01 PM by Dyann Kief, PA     Blood pressure is elevated today. She is not orthostatic. I talked to her about 2 g sodium diet. She will continue to have her blood pressure monitored at the free clinic. They can make adjustments as needed.    DYSPNEA   Syncope and collapse   Last Assessment & Plan   01/27/2011 Office Visit Written 01/27/2011  3:01 PM by Dyann Kief, PA     Patient had an episode of syncope while returning her grocery cart 3 weeks ago. Workup in the ER was negative except for hemoglobin of 10.7. She is having her CBC repeated today. Patient does have a carotid bruit, Systolic murmur, with history of moderate MR 2 years ago, and should have carotid Dopplers, 2-D echo, and stress Myoview. 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 She does not turned 66 until next year. She is quite tearful about this situation but truly cannot afford to have these tests performed.    Renal failure, acute   Atrial fibrillation   Last Assessment & Plan   09/21/2012 Office Visit Written 09/21/2012  2:40 PM by Jodelle Gross, NP     Heart rate is well-controlled. We will not make any adjustments in AV nodal blocking agents at this time.    Right heart failure   HTN (hypertension)   Last Assessment & Plan   08/24/2012 Office Visit  Written 08/24/2012  4:35 PM by Jodelle Gross, NP     Excellent control of blood pressure. We will continue to monitor closely after she had some diuresis using metolazone. To avoid dehydration or near syncope.    Anemia, iron deficiency   Last Assessment & Plan   08/10/2011 Office Visit Written 08/10/2011  4:25 PM by Jodelle Gross, NP     Most recent Hbg 11.9 with Hct 35.0. She will continue iron replacement and follow-up with PCP. No active bleeding.    Thrombocytopenia   Diastolic CHF, acute on chronic   Last Assessment & Plan   09/21/2012 Office Visit Edited 09/21/2012  2:40 PM by Jodelle Gross, NP     This is a difficult situation as the patient eats out a lot do to generalized fatigue about cooking at home. Her son states that he has been trying to cook low salt but does not always adhere to this. Review of her current labs demonstrate a potassium has normalized at 4.5, her creatinine is much better at 1.53 compared to 2.26. I will restart her torsemide and 40 mg daily,  will not give potassium replacement at this time as she is on spironolactone. Will not restart metolazone. She is due for followup labs in 5 days. He may need low-dose potassium replacement, or adjustment of Demadex based on labs. I have given her a low-sodium diet, and have asked her to do her best about eating at home more vs. getting take out. Will see her again in 2 weeks, and is to see Dr. Kristian Covey in one week with BMET prior to that appointment.    Hyponatremia   Conjunctival hemorrhage of left eye   Encounter for long-term (current) use of anticoagulants   Chronic diastolic heart failure   Last Assessment & Plan   03/15/2012 Office Visit Edited 03/15/2012  1:36 PM by Dyann Kief, PA     Patient's heart failure is controlled. She is up a couple pounds and will take an extra Zaroxolyn in the morning. There is no evidence of heart failure on exam today.She is followed by the heart failure clinic. She can see  Dr. Molly Maduro back in 6 months.    UTI (urinary tract infection)   Hypokalemia   Last Assessment & Plan   09/21/2012 Office Visit Written 09/21/2012  2:41 PM by Jodelle Gross, NP     Potassium has gotten much better at 4.5. She was given potassium replacement in the ER. Now that she is on spironolactone we will not add potassium replacement until followup labs are completed.    Weakness   CKD (chronic kidney disease), stage IV   Nonalcoholic fatty liver disease   Sepsis   Acute renal failure       Imaging: Dg Chest 2 View  09/16/2012  *RADIOLOGY REPORT*  Clinical Data: Cough and congestion.  CHEST - 2 VIEW  Comparison: 09/02/2012 portable x-ray and multiple other chest x- rays dating back to 2008.  Findings: A component of bronchial thickening is suspected bilaterally in a perihilar distribution.  Soft tissue prominence present in the left hilum.  This is more prominent compared to prior chest x-rays and suggest the possibility of a central infiltrate or mass.  Recommend follow-up radiographs with consideration for chest CT should there be a persistent abnormality.  No overt pulmonary edema is identified.  No evidence of pleural fluid.  Bony structures are unremarkable.  IMPRESSION: Central bronchial thickening bilaterally.  Soft tissue prominence in the left hilar region suggestive of central infiltrate or mass. Recommend follow-up chest radiographs.   Original Report Authenticated By: Irish Lack, M.D.
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
## 2012-10-05 NOTE — Assessment & Plan Note (Signed)
Heart rate is well-controlled. We will make no changes on her rate medications. She will continue on Coumadin with dosing per Vashti Hey RN,. INR is been checked in the office today.

## 2012-10-05 NOTE — Addendum Note (Signed)
Addended by: Derry Lory A on: 10/05/2012 02:12 PM   Modules accepted: Orders

## 2012-10-05 NOTE — Patient Instructions (Addendum)
Your physician recommends that you schedule a follow-up appointment in: 3 MONTHS  Your physician recommends that you return for lab work in: 2 WEEKS (SLIPS GIVEN-BMET)  Your physician recommends that you continue on your current medications as directed. Please refer to the Current Medication list given to you today.TAKE DEMEDEX AS DIRECTED 40MG  TWICE DAILY   Your physician recommends that you weigh, daily, at the same time every day, and in the same amount of clothing. Please record your daily weights on the handout provided and bring it to your next appointment.BRING WEIGHTS WITH YOU AT YOUR FOLLOW UP VISIT WITH LISA

## 2012-10-14 ENCOUNTER — Encounter (HOSPITAL_COMMUNITY): Payer: Self-pay | Admitting: Emergency Medicine

## 2012-10-14 ENCOUNTER — Emergency Department (HOSPITAL_COMMUNITY)
Admission: EM | Admit: 2012-10-14 | Discharge: 2012-10-14 | Disposition: A | Discharge status: Home / Self Care | Payer: Medicare Other | Attending: Emergency Medicine | Admitting: Emergency Medicine

## 2012-10-14 DIAGNOSIS — Z87828 Personal history of other (healed) physical injury and trauma: Secondary | ICD-10-CM | POA: Insufficient documentation

## 2012-10-14 DIAGNOSIS — Z79899 Other long term (current) drug therapy: Secondary | ICD-10-CM | POA: Insufficient documentation

## 2012-10-14 DIAGNOSIS — R319 Hematuria, unspecified: Secondary | ICD-10-CM

## 2012-10-14 DIAGNOSIS — I5033 Acute on chronic diastolic (congestive) heart failure: Secondary | ICD-10-CM | POA: Insufficient documentation

## 2012-10-14 DIAGNOSIS — E669 Obesity, unspecified: Secondary | ICD-10-CM | POA: Insufficient documentation

## 2012-10-14 DIAGNOSIS — Z7901 Long term (current) use of anticoagulants: Secondary | ICD-10-CM | POA: Insufficient documentation

## 2012-10-14 DIAGNOSIS — Z8719 Personal history of other diseases of the digestive system: Secondary | ICD-10-CM | POA: Insufficient documentation

## 2012-10-14 DIAGNOSIS — I10 Essential (primary) hypertension: Secondary | ICD-10-CM | POA: Insufficient documentation

## 2012-10-14 DIAGNOSIS — R0602 Shortness of breath: Secondary | ICD-10-CM | POA: Insufficient documentation

## 2012-10-14 DIAGNOSIS — Z862 Personal history of diseases of the blood and blood-forming organs and certain disorders involving the immune mechanism: Secondary | ICD-10-CM | POA: Insufficient documentation

## 2012-10-14 DIAGNOSIS — F329 Major depressive disorder, single episode, unspecified: Secondary | ICD-10-CM | POA: Insufficient documentation

## 2012-10-14 DIAGNOSIS — Z8679 Personal history of other diseases of the circulatory system: Secondary | ICD-10-CM | POA: Insufficient documentation

## 2012-10-14 DIAGNOSIS — Z8542 Personal history of malignant neoplasm of other parts of uterus: Secondary | ICD-10-CM | POA: Insufficient documentation

## 2012-10-14 DIAGNOSIS — E119 Type 2 diabetes mellitus without complications: Secondary | ICD-10-CM | POA: Insufficient documentation

## 2012-10-14 DIAGNOSIS — Z87448 Personal history of other diseases of urinary system: Secondary | ICD-10-CM | POA: Insufficient documentation

## 2012-10-14 DIAGNOSIS — F3289 Other specified depressive episodes: Secondary | ICD-10-CM | POA: Insufficient documentation

## 2012-10-14 DIAGNOSIS — Z794 Long term (current) use of insulin: Secondary | ICD-10-CM | POA: Insufficient documentation

## 2012-10-14 LAB — CBC WITH DIFFERENTIAL/PLATELET
Basophils Absolute: 0 10*3/uL (ref 0.0–0.1)
Eosinophils Relative: 1 % (ref 0–5)
Lymphocytes Relative: 13 % (ref 12–46)
Neutro Abs: 6 10*3/uL (ref 1.7–7.7)
Neutrophils Relative %: 79 % — ABNORMAL HIGH (ref 43–77)
Platelets: 166 10*3/uL (ref 150–400)
RBC: 3.99 MIL/uL (ref 3.87–5.11)
RDW: 18.3 % — ABNORMAL HIGH (ref 11.5–15.5)
WBC: 7.6 10*3/uL (ref 4.0–10.5)

## 2012-10-14 LAB — BASIC METABOLIC PANEL
CO2: 22 mEq/L (ref 19–32)
Calcium: 9.4 mg/dL (ref 8.4–10.5)
Potassium: 3.9 mEq/L (ref 3.5–5.1)
Sodium: 133 mEq/L — ABNORMAL LOW (ref 135–145)

## 2012-10-14 LAB — URINE MICROSCOPIC-ADD ON

## 2012-10-14 LAB — URINALYSIS, ROUTINE W REFLEX MICROSCOPIC
Nitrite: POSITIVE — AB
Protein, ur: >300 mg/dL — AB
Specific Gravity, Urine: 1.02 (ref 1.005–1.030)
Urobilinogen, UA: 4 mg/dL — ABNORMAL HIGH (ref 0.0–1.0)

## 2012-10-14 MED ORDER — SODIUM CHLORIDE 0.9 % IV BOLUS (SEPSIS)
500.0000 mL | Freq: Once | INTRAVENOUS | Status: AC
  Administered 2012-10-14: 500 mL via INTRAVENOUS

## 2012-10-14 MED ORDER — SODIUM CHLORIDE 0.9 % IV SOLN
INTRAVENOUS | Status: DC
  Administered 2012-10-14: 10:00:00 via INTRAVENOUS

## 2012-10-14 MED ORDER — CEPHALEXIN 500 MG PO CAPS: 500.0000 mg | ORAL_CAPSULE | Freq: Four times a day (QID) | ORAL | Status: DC

## 2012-10-14 NOTE — ED Notes (Signed)
States that she started having blood in her urine on Wednesday.  States that she saw Dr. Phillips Odor this morning who sent her to the ED for an evaluation of hematuria.

## 2012-10-14 NOTE — ED Notes (Signed)
Pt alert & oriented x4, stable gait. Patient given discharge instructions, paperwork & prescription(s). Patient  instructed to stop at the registration desk to finish any additional paperwork. Patient verbalized understanding. Pt left department w/ no further questions. 

## 2012-10-14 NOTE — ED Notes (Signed)
Pt's husband called to pick pt up.

## 2012-10-14 NOTE — ED Notes (Signed)
Pt sent from PCP for blood in urine since Wednesday, pt co SOB on ambulating.

## 2012-10-14 NOTE — ED Notes (Signed)
Dr. Beatrice Lecher offices reports that they are sending the patient to the ED for evaluation of hematuria.
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
## 2012-10-16 ENCOUNTER — Telehealth: Payer: Self-pay | Admitting: Adult Health

## 2012-10-16 LAB — URINE CULTURE

## 2012-10-16 NOTE — Telephone Encounter (Signed)
Spoke to [employer] concerning pt question, noted pt Pt numbers were good in the ED at 3.0, noted bleeding does not seem to be coming from coumadin, noted from recurring Kidney infections, pt was sent home on antibiotics, Misty Stanley advised to have KL review and make a decision or advise pt to have PCP follow up, please advise

## 2012-10-16 NOTE — Telephone Encounter (Signed)
Spoke to pt to advise results/instructions. Pt understood.  Stop ASA, start coumadin as directed below:  Total Sun & Wed 7.5mg  all other days 5mg  tablets New Dose 40 mg ) (5 mg x 1.5)  Pt confirmed address to mail hemoccult cards too, pt will f/u with lisa on 10-23-12, lisa made aware verbally

## 2012-10-16 NOTE — Telephone Encounter (Signed)
Patient went to ER on 10/14/12.  Was advised by ER to stop Coumadin and take 81mg  ASA.  States that she was told by ER to contact our office on Monday to make sure that was ok. / tgs

## 2012-10-16 NOTE — Telephone Encounter (Signed)
Continue coumadin as directed with close follow up with Misty Stanley. Hemoccult was not done in ER. Send her cards for this please.

## 2012-10-18 LAB — BASIC METABOLIC PANEL
Calcium: 9.7 mg/dL (ref 8.4–10.5)
Potassium: 4.6 mEq/L (ref 3.5–5.3)
Sodium: 142 mEq/L (ref 135–145)

## 2012-10-23 ENCOUNTER — Encounter: Payer: Self-pay | Admitting: *Deleted

## 2012-10-23 ENCOUNTER — Ambulatory Visit (INDEPENDENT_AMBULATORY_CARE_PROVIDER_SITE_OTHER): Payer: Medicare Other | Admitting: *Deleted

## 2012-10-23 DIAGNOSIS — Z7901 Long term (current) use of anticoagulants: Secondary | ICD-10-CM

## 2012-10-23 DIAGNOSIS — I4891 Unspecified atrial fibrillation: Secondary | ICD-10-CM

## 2012-10-24 ENCOUNTER — Encounter: Payer: Self-pay | Admitting: Adult Health

## 2012-10-25 ENCOUNTER — Telehealth (HOSPITAL_COMMUNITY): Payer: Self-pay | Admitting: Dietician

## 2012-10-25 NOTE — Telephone Encounter (Signed)
Called pt at 1400. She reports she requested appointment because she is having difficulty balancing diet for liver diease, HTN, and diabetes. Appointment scheduled for 11/07/12 at 1400.

## 2012-10-25 NOTE — Telephone Encounter (Signed)
Received message from Dr. Susa Griffins office to follow-up if pt has been scheduled. Never received referral on pt.

## 2012-10-25 NOTE — Telephone Encounter (Addendum)
Krystal [name] at Dr. Susa Griffins office at 1410 to confirm appointment time. She will refax referral.

## 2012-11-07 ENCOUNTER — Telehealth (HOSPITAL_COMMUNITY): Payer: Self-pay | Admitting: Dietician
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
## 2012-11-13 ENCOUNTER — Ambulatory Visit (INDEPENDENT_AMBULATORY_CARE_PROVIDER_SITE_OTHER): Payer: Medicare Other | Admitting: *Deleted

## 2012-11-13 DIAGNOSIS — Z7901 Long term (current) use of anticoagulants: Secondary | ICD-10-CM

## 2012-11-13 DIAGNOSIS — I4891 Unspecified atrial fibrillation: Secondary | ICD-10-CM

## 2012-11-13 LAB — POCT INR: INR: 3.5

## 2012-11-14 ENCOUNTER — Ambulatory Visit (INDEPENDENT_AMBULATORY_CARE_PROVIDER_SITE_OTHER): Payer: Medicare Other | Admitting: *Deleted

## 2012-11-14 DIAGNOSIS — Z0000 Encounter for general adult medical examination without abnormal findings: Secondary | ICD-10-CM

## 2012-11-14 LAB — POC HEMOCCULT BLD/STL (HOME/3-CARD/SCREEN): Card #2 Fecal Occult Blod, POC: NEGATIVE

## 2012-11-16 ENCOUNTER — Encounter: Payer: Self-pay | Admitting: *Deleted

## 2012-12-04 ENCOUNTER — Ambulatory Visit (INDEPENDENT_AMBULATORY_CARE_PROVIDER_SITE_OTHER): Payer: Medicare Other | Admitting: *Deleted

## 2012-12-04 DIAGNOSIS — I4891 Unspecified atrial fibrillation: Secondary | ICD-10-CM

## 2012-12-04 DIAGNOSIS — Z7901 Long term (current) use of anticoagulants: Secondary | ICD-10-CM

## 2012-12-04 MED ORDER — TORSEMIDE 20 MG PO TABS: 40.0000 mg | ORAL_TABLET | Freq: Every morning | ORAL | Status: DC

## 2012-12-04 MED ORDER — ATORVASTATIN CALCIUM 20 MG PO TABS: 20.0000 mg | ORAL_TABLET | Freq: Every day | ORAL | Status: DC

## 2012-12-04 MED ORDER — VERAPAMIL HCL ER 180 MG PO TBCR: 180.0000 mg | EXTENDED_RELEASE_TABLET | Freq: Every day | ORAL | Status: DC

## 2012-12-04 MED ORDER — METOPROLOL TARTRATE 25 MG PO TABS: ORAL_TABLET | ORAL | Status: DC

## 2012-12-04 MED ORDER — WARFARIN SODIUM 5 MG PO TABS: ORAL_TABLET | ORAL | Status: DC

## 2013-01-08 ENCOUNTER — Encounter: Payer: Self-pay | Admitting: Adult Health

## 2013-01-08 ENCOUNTER — Ambulatory Visit (INDEPENDENT_AMBULATORY_CARE_PROVIDER_SITE_OTHER): Payer: Medicare Other | Admitting: Adult Health

## 2013-01-08 ENCOUNTER — Ambulatory Visit (INDEPENDENT_AMBULATORY_CARE_PROVIDER_SITE_OTHER): Payer: Medicare Other | Admitting: *Deleted

## 2013-01-08 VITALS — BP 130/80 | HR 60 | Ht 67.0 in | Wt 242.0 lb

## 2013-01-08 DIAGNOSIS — I4891 Unspecified atrial fibrillation: Secondary | ICD-10-CM

## 2013-01-08 DIAGNOSIS — I509 Heart failure, unspecified: Secondary | ICD-10-CM

## 2013-01-08 DIAGNOSIS — R0602 Shortness of breath: Secondary | ICD-10-CM

## 2013-01-08 DIAGNOSIS — Z7901 Long term (current) use of anticoagulants: Secondary | ICD-10-CM

## 2013-01-08 DIAGNOSIS — I5033 Acute on chronic diastolic (congestive) heart failure: Secondary | ICD-10-CM

## 2013-01-08 LAB — POCT INR: INR: 1.8

## 2013-01-08 NOTE — Assessment & Plan Note (Signed)
This is improved with weight loss and fluid management. Continue her current medication regimen.

## 2013-01-08 NOTE — Assessment & Plan Note (Signed)
She appears stable from a cardiac standpoint. No clinical evidence of heart failure. Weight is down 20 pounds without evidence of edema or dyspnea on exertion. She is adhering to a low sodium diet and taking medications as directed. I have told her how well she is doing and encouraged her to continue her current lifestyle changes. See her again in 4 months unless she becomes symptomatic.
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
## 2013-01-08 NOTE — Assessment & Plan Note (Signed)
Rate is currently well-controlled. She will continue with current medication regimen, and be followed in our  Coumadin clinic.

## 2013-01-08 NOTE — Progress Notes (Deleted)
Name: Krystal [name]    DOB: September 06, 1945  Age: 67 y.o.  MR#: [id number]       PCP:  Colette Ribas, MD      Insurance: Payor: [employer] [place] [employer] / Plan: BLUE MEDICARE / Product Type: *No Product type* /   CC:   No chief complaint on file.   VS Filed Vitals:   01/08/13 1303  BP: 130/80  Pulse: 60  Height: 5\' 7"  (1.702 m)  Weight: 242 lb (109.77 kg)    Weights Current Weight  01/08/13 242 lb (109.77 kg)  10/14/12 266 lb (120.657 kg)  10/05/12 274 lb (124.286 kg)    Blood Pressure  BP Readings from Last 3 Encounters:  01/08/13 130/80  10/14/12 136/72  10/05/12 119/73     Admit date:  (Not on file) Last encounter with RMR:  10/16/2012   Allergy Ace inhibitors; Actos; Aspirin; Celecoxib; Diovan; Metformin and related; and Tylenol  Current Outpatient Prescriptions  Medication Sig Dispense Refill  . ALPRAZolam (XANAX) 0.5 MG tablet Take 0.25 mg by mouth at bedtime.       Marland Kitchen atorvastatin (LIPITOR) 20 MG tablet Take 1 tablet (20 mg total) by mouth at bedtime.  30 tablet  6  . Cholecalciferol (VITAMIN D3) 2000 UNITS TABS Take 1 tablet by mouth daily.       . citalopram (CELEXA) 20 MG tablet Take 20 mg by mouth at bedtime.       . insulin glargine (LANTUS) 100 UNIT/ML injection Inject 0.15 mLs (15 Units total) into the skin at bedtime.  10 mL  0  . insulin lispro (HUMALOG) 100 UNIT/ML injection Inject 5 Units into the skin 3 (three) times daily before meals. Take 5 units before meals. Adds sliding scale if needed for additional coverage.      . metoprolol tartrate (LOPRESSOR) 25 MG tablet Take 1/2 tablet twice daily  30 tablet  6  . SALINE NASAL MIST NA Place 1 spray into the nose daily as needed (for congestion).       Marland Kitchen spironolactone (ALDACTONE) 50 MG tablet Take 1 tablet (50 mg total) by mouth daily.  90 tablet  1  . torsemide (DEMADEX) 20 MG tablet Take 2 tablets (40 mg total) by mouth every morning.  60 tablet  6  . verapamil (CALAN-SR) 180 MG CR tablet  Take 1 tablet (180 mg total) by mouth at bedtime.  30 tablet  6  . warfarin (COUMADIN) 5 MG tablet Take 1 tablet daily except 1 1/2 tablets on Wednesdays  45 tablet  6   No current facility-administered medications for this visit.    Discontinued Meds:    Medications Discontinued During This Encounter  Medication Reason  . cephALEXin (KEFLEX) 500 MG capsule Error    Patient Active Problem List   Diagnosis Date Noted  . Sepsis 09/03/2012  . Acute renal failure 09/03/2012  . UTI (urinary tract infection) 09/02/2012  . Hypokalemia 09/02/2012  . Weakness 09/02/2012  . CKD (chronic kidney disease), stage IV 09/02/2012  . Nonalcoholic fatty liver disease 09/02/2012  . Chronic diastolic heart failure 10/25/2011  . Encounter for long-term (current) use of anticoagulants 06/28/2011  . Conjunctival hemorrhage of left eye 06/25/2011  . Anemia, iron deficiency 06/21/2011  . Thrombocytopenia 06/21/2011  . Diastolic CHF, acute on chronic 06/21/2011  . Hyponatremia 06/21/2011  . Renal failure, acute 06/14/2011  . Atrial fibrillation 06/14/2011  . Right heart failure 06/14/2011  . Syncope and collapse 01/27/2011  .  DIABETES MELLITUS, TYPE II 04/25/2009  . OBESITY 04/25/2009  . HYPERTENSION 12/19/2008  . DYSPNEA 12/19/2008    LABS    Component Value Date/Time   NA 142 10/18/2012 1325   NA 133* 10/14/2012 0933   NA 138 09/19/2012 1721   K 4.6 10/18/2012 1325   K 3.9 10/14/2012 0933   K 4.5 09/19/2012 1721   CL 106 10/18/2012 1325   CL 94* 10/14/2012 0933   CL 96 09/19/2012 1721   CO2 25 10/18/2012 1325   CO2 22 10/14/2012 0933   CO2 32 09/19/2012 1721   GLUCOSE 117* 10/18/2012 1325   GLUCOSE 161* 10/14/2012 0933   GLUCOSE 151* 09/19/2012 1721   BUN 33* 10/18/2012 1325   BUN 37* 10/14/2012 0933   BUN 25* 09/19/2012 1721   CREATININE 1.75* 10/18/2012 1325   CREATININE 1.96* 10/14/2012 0933   CREATININE 1.53* 09/19/2012 1721   CREATININE 1.52* 09/19/2012 1014   CREATININE 1.69* 09/18/2012 1531   CREATININE 1.83*  09/08/2012 0449   CALCIUM 9.7 10/18/2012 1325   CALCIUM 9.4 10/14/2012 0933   CALCIUM 9.0 09/19/2012 1721   GFRNONAA 26* 10/14/2012 0933   GFRNONAA 31* 09/18/2012 1531   GFRNONAA 28* 09/08/2012 0449   GFRAA 30* 10/14/2012 0933   GFRAA 36* 09/18/2012 1531   GFRAA 32* 09/08/2012 0449   CMP     Component Value Date/Time   NA 142 10/18/2012 1325   K 4.6 10/18/2012 1325   CL 106 10/18/2012 1325   CO2 25 10/18/2012 1325   GLUCOSE 117* 10/18/2012 1325   BUN 33* 10/18/2012 1325   CREATININE 1.75* 10/18/2012 1325   CREATININE 1.96* 10/14/2012 0933   CALCIUM 9.7 10/18/2012 1325   PROT 6.2 09/05/2012 0441   ALBUMIN 2.6* 09/05/2012 0441   AST 86* 09/05/2012 0441   ALT 25 09/05/2012 0441   ALKPHOS 76 09/05/2012 0441   BILITOT 1.5* 09/05/2012 0441   GFRNONAA 26* 10/14/2012 0933   GFRAA 30* 10/14/2012 0933       Component Value Date/Time   WBC 7.6 10/14/2012 0933   WBC 6.5 09/18/2012 1531   WBC 6.9 09/08/2012 0449   HGB 10.8* 10/14/2012 0933   HGB 9.0* 09/18/2012 1531   HGB 9.7* 09/08/2012 0449   HCT 34.6* 10/14/2012 0933   HCT 28.7* 09/18/2012 1531   HCT 31.5* 09/08/2012 0449   MCV 86.7 10/14/2012 0933   MCV 87.8 09/18/2012 1531   MCV 89.7 09/08/2012 0449    Lipid Panel     Component Value Date/Time   CHOL 106 05/16/2009 2012   TRIG 108 05/16/2009 2012   HDL 30* 05/16/2009 2012   CHOLHDL 3.5 Ratio 05/16/2009 2012   VLDL 22 05/16/2009 2012   LDLCALC 54 05/16/2009 2012    ABG    Component Value Date/Time   PHART 7.485* 07/28/2011 0455   PCO2ART 46.4* 07/28/2011 0455   PO2ART 86.0 07/28/2011 0455   HCO3 34.6* 07/28/2011 0455   TCO2 24 08/05/2011 1121   O2SAT 97.0 07/28/2011 0455     Lab Results  Component Value Date   TSH 1.553 07/22/2011   BNP (last 3 results) No results found for this basename: PROBNP,  in the last 8760 hours Cardiac Panel (last 3 results) No results found for this basename: CKTOTAL, CKMB, TROPONINI, RELINDX,  in the last 72 hours  Iron/TIBC/Ferritin    Component Value Date/Time   IRON 35* 07/23/2011 1756   TIBC  330 07/23/2011 1756   FERRITIN 120 07/23/2011 1756  EKG Orders placed in visit on 09/21/12  . EKG 12-LEAD     Prior Assessment and Plan Problem List as of 01/08/2013     Cardiovascular and Mediastinum   HYPERTENSION   Last Assessment & Plan   03/15/2012 Office Visit Written 03/15/2012  1:34 PM by Dyann Kief, PA     Controlled    Syncope and collapse   Last Assessment & Plan   01/27/2011 Office Visit Written 01/27/2011  3:01 PM by Dyann Kief, PA     Patient had an episode of syncope while returning her grocery cart 3 weeks ago. Workup in the ER was negative except for hemoglobin of 10.7. She is having her CBC repeated today. Patient does have a carotid bruit, Systolic murmur, with history of moderate MR 2 years ago, and should have carotid Dopplers, 2-D echo, and stress Myoview. 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 She does not turned 66 until next year. She is quite tearful about this situation but truly cannot afford to have these tests performed.    Atrial fibrillation   Last Assessment & Plan   10/05/2012 Office Visit Written 10/05/2012  2:04 PM by Jodelle Gross, NP     Heart rate is well-controlled. We will make no changes on her rate medications. She will continue on Coumadin with dosing per Vashti Hey RN,. INR is been checked in the office today.    Right heart failure   Diastolic CHF, acute on chronic   Last Assessment & Plan   09/21/2012 Office Visit Edited 09/21/2012  2:40 PM by Jodelle Gross, NP     This is a difficult situation as the patient eats out a lot do to generalized fatigue about cooking at home. Her son states that he has been trying to cook low salt but does not always adhere to this. Review of her current labs demonstrate a potassium has normalized at 4.5, her creatinine is much better at 1.53 compared to 2.26. I will restart her torsemide  and 40 mg daily, will not give potassium replacement at this time as she is on spironolactone. Will not restart metolazone. She is due for followup labs in 5 days. He may need low-dose potassium replacement, or adjustment of Demadex based on labs. I have given her a low-sodium diet, and have asked her to do her best about eating at home more vs. getting take out. Will see her again in 2 weeks, and is to see Dr. Kristian Covey in one week with BMET prior to that appointment.    Chronic diastolic heart failure   Last Assessment & Plan   10/05/2012 Office Visit Written 10/05/2012  2:03 PM by Jodelle Gross, NP     She has had her Lasix changed to [facility] by Dr. Kristian Covey, but unfortunately has not been taking it as directed. The patient should be on 40 mg daily but is only taking 20. Her weight has not changed she continues dyspneic on exertion. Overall assessment does not show evidence of significant fluid overload. She does have some mild dependent edema with some right basilar crackles only. I have advised her to take the Demadex as directed and to weigh herself daily. She is due to followup in the Coumadin clinic on May 21 vascular for her weights with her at that time. The patient will be seen in 3 months with a  BMET scheduled for 2 weeks.      Digestive   Nonalcoholic fatty liver disease     Endocrine   DIABETES MELLITUS, TYPE II   Last Assessment & Plan   11/03/2011 Office Visit Written 11/03/2011 11:14 AM by Dyann Kief, PA     Patient sugars have been running on the high side recently and she is scheduled to see an endocrinologist next month.      Genitourinary   Renal failure, acute   UTI (urinary tract infection)   CKD (chronic kidney disease), stage IV   Acute renal failure     Hematopoietic and Hemostatic   Thrombocytopenia     Other   OBESITY   DYSPNEA   Anemia, iron deficiency   Last Assessment & Plan   08/10/2011 Office Visit Written 08/10/2011  4:25 PM by Jodelle Gross,  NP     Most recent Hbg 11.9 with Hct 35.0. She will continue iron replacement and follow-up with PCP. No active bleeding.    Hyponatremia   Conjunctival hemorrhage of left eye   Encounter for long-term (current) use of anticoagulants   Hypokalemia   Last Assessment & Plan   09/21/2012 Office Visit Written 09/21/2012  2:41 PM by Jodelle Gross, NP     Potassium has gotten much better at 4.5. She was given potassium replacement in the ER. Now that she is on spironolactone we will not add potassium replacement until followup labs are completed.    Weakness   Sepsis       Imaging: No results found.

## 2013-01-08 NOTE — Patient Instructions (Signed)
Your physician recommends that you schedule a follow-up appointment in: 3-4 months. Your physician recommends that you continue on your current medications as directed. Please refer to the Current Medication list given to you today. 

## 2013-01-29 ENCOUNTER — Ambulatory Visit (INDEPENDENT_AMBULATORY_CARE_PROVIDER_SITE_OTHER): Payer: Medicare Other | Admitting: *Deleted

## 2013-01-29 DIAGNOSIS — Z7901 Long term (current) use of anticoagulants: Secondary | ICD-10-CM

## 2013-01-29 DIAGNOSIS — I4891 Unspecified atrial fibrillation: Secondary | ICD-10-CM

## 2013-01-29 LAB — POCT INR: INR: 1.8

## 2013-01-29 MED ORDER — WARFARIN SODIUM 5 MG PO TABS: ORAL_TABLET | ORAL | Status: DC

## 2013-02-19 ENCOUNTER — Ambulatory Visit (INDEPENDENT_AMBULATORY_CARE_PROVIDER_SITE_OTHER): Payer: Medicare Other | Admitting: *Deleted

## 2013-02-19 DIAGNOSIS — I4891 Unspecified atrial fibrillation: Secondary | ICD-10-CM

## 2013-02-19 DIAGNOSIS — Z7901 Long term (current) use of anticoagulants: Secondary | ICD-10-CM

## 2013-03-19 ENCOUNTER — Ambulatory Visit (INDEPENDENT_AMBULATORY_CARE_PROVIDER_SITE_OTHER): Payer: Medicare Other | Admitting: *Deleted

## 2013-03-19 DIAGNOSIS — Z7901 Long term (current) use of anticoagulants: Secondary | ICD-10-CM

## 2013-03-19 DIAGNOSIS — I4891 Unspecified atrial fibrillation: Secondary | ICD-10-CM

## 2013-03-19 LAB — POCT INR: INR: 1.8

## 2013-04-09 ENCOUNTER — Encounter: Payer: Self-pay | Admitting: Adult Health

## 2013-04-09 ENCOUNTER — Ambulatory Visit (INDEPENDENT_AMBULATORY_CARE_PROVIDER_SITE_OTHER): Payer: Medicare Other | Admitting: Adult Health

## 2013-04-09 ENCOUNTER — Ambulatory Visit (INDEPENDENT_AMBULATORY_CARE_PROVIDER_SITE_OTHER): Payer: Medicare Other | Admitting: *Deleted

## 2013-04-09 VITALS — BP 129/76 | HR 97 | Ht 67.0 in | Wt 248.0 lb

## 2013-04-09 DIAGNOSIS — I10 Essential (primary) hypertension: Secondary | ICD-10-CM

## 2013-04-09 DIAGNOSIS — E119 Type 2 diabetes mellitus without complications: Secondary | ICD-10-CM

## 2013-04-09 DIAGNOSIS — R21 Rash and other nonspecific skin eruption: Secondary | ICD-10-CM

## 2013-04-09 DIAGNOSIS — I4891 Unspecified atrial fibrillation: Secondary | ICD-10-CM

## 2013-04-09 DIAGNOSIS — I509 Heart failure, unspecified: Secondary | ICD-10-CM

## 2013-04-09 DIAGNOSIS — I5033 Acute on chronic diastolic (congestive) heart failure: Secondary | ICD-10-CM

## 2013-04-09 DIAGNOSIS — Z7901 Long term (current) use of anticoagulants: Secondary | ICD-10-CM

## 2013-04-09 LAB — POCT INR: INR: 1.9

## 2013-04-09 MED ORDER — GABAPENTIN 300 MG PO CAPS: 300.0000 mg | ORAL_CAPSULE | Freq: Three times a day (TID) | ORAL | Status: DC

## 2013-04-09 NOTE — Assessment & Plan Note (Signed)
Located on the right neck, scalp and shoulder. Appears to be shingles but uncertain,. No significant pain with the exception of pain from neck into head behind right eye. I have asked Dr. Purvis Sheffield to look at rash for recommendations. Typical and Atypical features of shingles. Will begin gabapentin 300 mg TID for one week. If pain does not improve or rash worsens, she is advised to see her PCP.

## 2013-04-09 NOTE — Progress Notes (Deleted)
Name: Krystal [name]    DOB: [birth year]  Age: 67 y.o.  MR#: [id number]       PCP:  Colette Ribas, MD      Insurance: Payor: [employer] [place] [employer] / Plan: BLUE MEDICARE / Product Type: *No Product type* /   CC:    Chief Complaint  Patient presents with  . Atrial Fibrillation  . Congestive Heart Failure    Diastolic    VS Filed Vitals:   04/09/13 1325  BP: 129/76  Pulse: 97  Height: 5\' 7"  (1.702 m)  Weight: 248 lb (112.492 kg)    Weights Current Weight  04/09/13 248 lb (112.492 kg)  01/08/13 242 lb (109.77 kg)  10/14/12 266 lb (120.657 kg)    Blood Pressure  BP Readings from Last 3 Encounters:  04/09/13 129/76  01/08/13 130/80  10/14/12 136/72     Admit date:  (Not on file) Last encounter with RMR:  01/08/2013   Allergy Ace inhibitors; Actos; Aspirin; Celecoxib; Diovan; Metformin and related; and Tylenol  Current Outpatient Prescriptions  Medication Sig Dispense Refill  . ALPRAZolam (XANAX) 0.5 MG tablet Take 0.25 mg by mouth at bedtime.       Marland Kitchen atorvastatin (LIPITOR) 20 MG tablet Take 1 tablet (20 mg total) by mouth at bedtime.  30 tablet  6  . Cholecalciferol (VITAMIN D3) 2000 UNITS TABS Take 1 tablet by mouth daily.       . citalopram (CELEXA) 20 MG tablet Take 20 mg by mouth at bedtime.       . insulin glargine (LANTUS) 100 UNIT/ML injection Inject 0.15 mLs (15 Units total) into the skin at bedtime.  10 mL  0  . insulin lispro (HUMALOG) 100 UNIT/ML injection Inject 5 Units into the skin 3 (three) times daily before meals. Take 5 units before meals. Adds sliding scale if needed for additional coverage.      . metoprolol tartrate (LOPRESSOR) 25 MG tablet Take 1/2 tablet twice daily  30 tablet  6  . spironolactone (ALDACTONE) 50 MG tablet Take 1 tablet (50 mg total) by mouth daily.  90 tablet  1  . torsemide (DEMADEX) 20 MG tablet Take 2 tablets (40 mg total) by mouth every morning.  60 tablet  6  . verapamil (CALAN-SR) 180 MG CR tablet Take 1  tablet (180 mg total) by mouth at bedtime.  30 tablet  6  . warfarin (COUMADIN) 5 MG tablet Take 1 tablet daily except 1 1/2 tablets on Mondays, Wednesdays and Fridays  45 tablet  6  . NOVOFINE 30G X 8 MM MISC       . SALINE NASAL MIST NA Place 1 spray into the nose daily as needed (for congestion).        No current facility-administered medications for this visit.    Discontinued Meds:   There are no discontinued medications.  Patient Active Problem List   Diagnosis Date Noted  . Sepsis 09/03/2012  . Acute renal failure 09/03/2012  . Hypokalemia 09/02/2012  . Weakness 09/02/2012  . CKD (chronic kidney disease), stage IV 09/02/2012  . Nonalcoholic fatty liver disease 09/02/2012  . Encounter for long-term (current) use of anticoagulants 06/28/2011  . Conjunctival hemorrhage of left eye 06/25/2011  . Anemia, iron deficiency 06/21/2011  . Thrombocytopenia 06/21/2011  . Diastolic CHF, acute on chronic 06/21/2011  . Hyponatremia 06/21/2011  . Atrial fibrillation 06/14/2011  . Syncope and collapse 01/27/2011  . DIABETES MELLITUS, TYPE II 04/25/2009  .  OBESITY 04/25/2009  . HYPERTENSION 12/19/2008  . DYSPNEA 12/19/2008    LABS    Component Value Date/Time   NA 142 10/18/2012 1325   NA 133* 10/14/2012 0933   NA 138 09/19/2012 1721   K 4.6 10/18/2012 1325   K 3.9 10/14/2012 0933   K 4.5 09/19/2012 1721   CL 106 10/18/2012 1325   CL 94* 10/14/2012 0933   CL 96 09/19/2012 1721   CO2 25 10/18/2012 1325   CO2 22 10/14/2012 0933   CO2 32 09/19/2012 1721   GLUCOSE 117* 10/18/2012 1325   GLUCOSE 161* 10/14/2012 0933   GLUCOSE 151* 09/19/2012 1721   BUN 33* 10/18/2012 1325   BUN 37* 10/14/2012 0933   BUN 25* 09/19/2012 1721   CREATININE 1.75* 10/18/2012 1325   CREATININE 1.96* 10/14/2012 0933   CREATININE 1.53* 09/19/2012 1721   CREATININE 1.52* 09/19/2012 1014   CREATININE 1.69* 09/18/2012 1531   CREATININE 1.83* 09/08/2012 0449   CALCIUM 9.7 10/18/2012 1325   CALCIUM 9.4 10/14/2012 0933   CALCIUM 9.0 09/19/2012 1721    GFRNONAA 26* 10/14/2012 0933   GFRNONAA 31* 09/18/2012 1531   GFRNONAA 28* 09/08/2012 0449   GFRAA 30* 10/14/2012 0933   GFRAA 36* 09/18/2012 1531   GFRAA 32* 09/08/2012 0449   CMP     Component Value Date/Time   NA 142 10/18/2012 1325   K 4.6 10/18/2012 1325   CL 106 10/18/2012 1325   CO2 25 10/18/2012 1325   GLUCOSE 117* 10/18/2012 1325   BUN 33* 10/18/2012 1325   CREATININE 1.75* 10/18/2012 1325   CREATININE 1.96* 10/14/2012 0933   CALCIUM 9.7 10/18/2012 1325   PROT 6.2 09/05/2012 0441   ALBUMIN 2.6* 09/05/2012 0441   AST 86* 09/05/2012 0441   ALT 25 09/05/2012 0441   ALKPHOS 76 09/05/2012 0441   BILITOT 1.5* 09/05/2012 0441   GFRNONAA 26* 10/14/2012 0933   GFRAA 30* 10/14/2012 0933       Component Value Date/Time   WBC 7.6 10/14/2012 0933   WBC 6.5 09/18/2012 1531   WBC 6.9 09/08/2012 0449   HGB 10.8* 10/14/2012 0933   HGB 9.0* 09/18/2012 1531   HGB 9.7* 09/08/2012 0449   HCT 34.6* 10/14/2012 0933   HCT 28.7* 09/18/2012 1531   HCT 31.5* 09/08/2012 0449   MCV 86.7 10/14/2012 0933   MCV 87.8 09/18/2012 1531   MCV 89.7 09/08/2012 0449    Lipid Panel     Component Value Date/Time   CHOL 106 05/16/2009 2012   TRIG 108 05/16/2009 2012   HDL 30* 05/16/2009 2012   CHOLHDL 3.5 Ratio 05/16/2009 2012   VLDL 22 05/16/2009 2012   LDLCALC 54 05/16/2009 2012    ABG    Component Value Date/Time   PHART 7.485* 07/28/2011 0455   PCO2ART 46.4* 07/28/2011 0455   PO2ART 86.0 07/28/2011 0455   HCO3 34.6* 07/28/2011 0455   TCO2 24 08/05/2011 1121   O2SAT 97.0 07/28/2011 0455     Lab Results  Component Value Date   TSH 1.553 07/22/2011   BNP (last 3 results) No results found for this basename: PROBNP,  in the last 8760 hours Cardiac Panel (last 3 results) No results found for this basename: CKTOTAL, CKMB, TROPONINI, RELINDX,  in the last 72 hours  Iron/TIBC/Ferritin    Component Value Date/Time   IRON 35* 07/23/2011 1756   TIBC 330 07/23/2011 1756   FERRITIN 120 07/23/2011 1756     EKG Orders placed in visit on 09/21/12  .  EKG  12-LEAD     Prior Assessment and Plan Problem List as of 04/09/2013     Cardiovascular and Mediastinum   HYPERTENSION   Last Assessment & Plan   03/15/2012 Office Visit Written 03/15/2012  1:34 PM by Dyann Kief, PA     Controlled    Syncope and collapse   Last Assessment & Plan   01/27/2011 Office Visit Written 01/27/2011  3:01 PM by Dyann Kief, PA     Patient had an episode of syncope while returning her grocery cart 3 weeks ago. Workup in the ER was negative except for hemoglobin of 10.7. She is having her CBC repeated today. Patient does have a carotid bruit, Systolic murmur, with history of moderate MR 2 years ago, and should have carotid Dopplers, 2-D echo, and stress Myoview. 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 She does not turned 66 until next year. She is quite tearful about this situation but truly cannot afford to have these tests performed.    Atrial fibrillation   Last Assessment & Plan   01/08/2013 Office Visit Written 01/08/2013  4:54 PM by Jodelle Gross, NP     Rate is currently well-controlled. She will continue with current medication regimen, and be followed in our [place] [facility].    Diastolic CHF, acute on chronic   Last Assessment & Plan   01/08/2013 Office Visit Written 01/08/2013  4:53 PM by Jodelle Gross, NP     She appears stable from a cardiac standpoint. No clinical evidence of heart failure. Weight is down 20 pounds without evidence of edema or dyspnea on exertion. She is adhering to a low sodium diet and taking medications as directed. I have told her how well she is doing and encouraged her to continue her current lifestyle changes. See her again in 4 months unless she becomes symptomatic.      Digestive   Nonalcoholic fatty liver disease     Endocrine   DIABETES MELLITUS, TYPE II   Last Assessment & Plan   11/03/2011  Office Visit Written 11/03/2011 11:14 AM by Dyann Kief, PA     Patient sugars have been running on the high side recently and she is scheduled to see an endocrinologist next month.      Genitourinary   CKD (chronic kidney disease), stage IV   Acute renal failure     Hematopoietic and Hemostatic   Thrombocytopenia     Other   OBESITY   DYSPNEA   Last Assessment & Plan   01/08/2013 Office Visit Written 01/08/2013  4:54 PM by Jodelle Gross, NP     This is improved with weight loss and fluid management. Continue her current medication regimen.    Anemia, iron deficiency   Last Assessment & Plan   08/10/2011 Office Visit Written 08/10/2011  4:25 PM by Jodelle Gross, NP     Most recent Hbg 11.9 with Hct 35.0. She will continue iron replacement and follow-up with PCP. No active bleeding.    Hyponatremia   Conjunctival hemorrhage of left eye   Encounter for long-term (current) use of anticoagulants   Hypokalemia   Last Assessment & Plan   09/21/2012 Office Visit Written 09/21/2012  2:41 PM by Jodelle Gross, NP     Potassium has gotten much better at 4.5. She was given potassium replacement in the  ER. Now that she is on spironolactone we will not add potassium replacement until followup labs are completed.    Weakness   Sepsis       Imaging: No results found.

## 2013-04-09 NOTE — Assessment & Plan Note (Signed)
No evidence of decompensation. Wt essentially the same. Continue medications. CBC and BMET are ordered.
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
## 2013-04-09 NOTE — Patient Instructions (Addendum)
Your physician recommends that you schedule a follow-up appointment in: 6 months with Dr Reggy Eye will receive a reminder letter two months in advance reminding you to call and schedule your appointment. If you don't receive this letter, please contact our office.  Your physician recommends that you return for lab work this week. BMET, CBC  Your physician has recommended you make the following change in your medication:  1. Start Gabapentin 300 mg three times a day for 1 week.

## 2013-04-09 NOTE — Assessment & Plan Note (Signed)
Rate is controlled. INR checked today by coumadin clinic 1.90. No evidence of bleeding. Continue current medications as directed, with coumadin adjustments as per coumadin clinic.

## 2013-04-10 LAB — CBC
Hemoglobin: 14 g/dL (ref 12.0–15.0)
RBC: 4.61 MIL/uL (ref 3.87–5.11)

## 2013-04-10 LAB — BASIC METABOLIC PANEL
Calcium: 10 mg/dL (ref 8.4–10.5)
Creat: 1.92 mg/dL — ABNORMAL HIGH (ref 0.50–1.10)

## 2013-04-10 MED ORDER — GABAPENTIN 300 MG PO CAPS: 300.0000 mg | ORAL_CAPSULE | Freq: Two times a day (BID) | ORAL | Status: DC

## 2013-04-10 MED ORDER — SPIRONOLACTONE 50 MG PO TABS: 25.0000 mg | ORAL_TABLET | Freq: Every day | ORAL | Status: DC

## 2013-04-10 NOTE — Addendum Note (Signed)
Addended by: Thompson Grayer on: 04/10/2013 05:11 PM   Modules accepted: Orders

## 2013-04-20 LAB — BASIC METABOLIC PANEL
CO2: 30 mEq/L (ref 19–32)
Glucose, Bld: 144 mg/dL — ABNORMAL HIGH (ref 70–99)
Potassium: 4.4 mEq/L (ref 3.5–5.3)
Sodium: 138 mEq/L (ref 135–145)

## 2013-04-23 ENCOUNTER — Other Ambulatory Visit: Payer: Self-pay | Admitting: *Deleted

## 2013-04-23 ENCOUNTER — Encounter: Payer: Self-pay | Admitting: *Deleted

## 2013-04-23 DIAGNOSIS — I10 Essential (primary) hypertension: Secondary | ICD-10-CM

## 2013-04-23 MED ORDER — SPIRONOLACTONE 25 MG PO TABS: 12.5000 mg | ORAL_TABLET | Freq: Every day | ORAL | Status: DC

## 2013-05-07 ENCOUNTER — Ambulatory Visit (INDEPENDENT_AMBULATORY_CARE_PROVIDER_SITE_OTHER): Payer: Medicare Other | Admitting: *Deleted

## 2013-05-07 DIAGNOSIS — I4891 Unspecified atrial fibrillation: Secondary | ICD-10-CM

## 2013-05-07 DIAGNOSIS — Z7901 Long term (current) use of anticoagulants: Secondary | ICD-10-CM

## 2013-05-07 LAB — POCT INR: INR: 2.9

## 2013-05-08 LAB — BASIC METABOLIC PANEL
Calcium: 9.6 mg/dL (ref 8.4–10.5)
Chloride: 100 mEq/L (ref 96–112)
Potassium: 3.9 mEq/L (ref 3.5–5.3)
Sodium: 138 mEq/L (ref 135–145)

## 2013-05-14 ENCOUNTER — Encounter: Payer: Self-pay | Admitting: *Deleted

## 2013-06-21 ENCOUNTER — Ambulatory Visit (INDEPENDENT_AMBULATORY_CARE_PROVIDER_SITE_OTHER): Payer: Medicare Other | Admitting: *Deleted

## 2013-06-21 DIAGNOSIS — I4891 Unspecified atrial fibrillation: Secondary | ICD-10-CM

## 2013-06-21 DIAGNOSIS — Z7901 Long term (current) use of anticoagulants: Secondary | ICD-10-CM

## 2013-06-21 LAB — POCT INR: INR: 2.9

## 2013-07-14 ENCOUNTER — Other Ambulatory Visit: Payer: Self-pay | Admitting: Cardiology

## 2013-07-23 ENCOUNTER — Other Ambulatory Visit: Payer: Self-pay | Admitting: Cardiology

## 2013-08-14 ENCOUNTER — Telehealth: Payer: Self-pay | Admitting: *Deleted

## 2013-08-14 ENCOUNTER — Telehealth: Payer: Self-pay | Admitting: Physician Assistant

## 2013-08-14 MED ORDER — TORSEMIDE 20 MG PO TABS: ORAL_TABLET | ORAL | Status: DC

## 2013-08-14 MED ORDER — METOPROLOL TARTRATE 25 MG PO TABS: ORAL_TABLET | ORAL | Status: DC

## 2013-08-14 NOTE — Telephone Encounter (Signed)
Received fax refill request [employer]  Rx #  Medication: metoprolol 25 mg , torsemide 20 mg Qty #30, #60 Sig:  Physician: m lenze

## 2013-08-14 NOTE — Telephone Encounter (Signed)
Received fax refill request  Rx # [id number] Medication:  Metoprolol 25 mg tablet Qty 30 Sig:  Take 1/2 tablet by mouth twice daily Physician:  April Manson

## 2013-08-14 NOTE — Telephone Encounter (Signed)
Prescription sent to pharmacy on 08/14/13 and receipt of prescription was confirmed by pharmacy.

## 2013-08-14 NOTE — Telephone Encounter (Signed)
Received fax refill request  Rx # [id number] Medication:  Torsemide 20 mg tablet Qty 60 Sig:  Take two tablets by mouth every morning Physician:  April Manson

## 2013-08-22 ENCOUNTER — Ambulatory Visit (INDEPENDENT_AMBULATORY_CARE_PROVIDER_SITE_OTHER): Payer: Medicare Other | Admitting: *Deleted

## 2013-08-22 ENCOUNTER — Other Ambulatory Visit: Payer: Self-pay | Admitting: Cardiology

## 2013-08-22 DIAGNOSIS — Z5181 Encounter for therapeutic drug level monitoring: Secondary | ICD-10-CM

## 2013-08-22 DIAGNOSIS — I4891 Unspecified atrial fibrillation: Secondary | ICD-10-CM

## 2013-08-22 DIAGNOSIS — Z7901 Long term (current) use of anticoagulants: Secondary | ICD-10-CM

## 2013-08-22 LAB — POCT INR: INR: 3.9

## 2013-08-22 MED ORDER — WARFARIN SODIUM 5 MG PO TABS: ORAL_TABLET | ORAL | Status: DC

## 2013-09-12 ENCOUNTER — Ambulatory Visit (INDEPENDENT_AMBULATORY_CARE_PROVIDER_SITE_OTHER): Payer: Medicare Other | Admitting: *Deleted

## 2013-09-12 DIAGNOSIS — Z5181 Encounter for therapeutic drug level monitoring: Secondary | ICD-10-CM

## 2013-09-12 DIAGNOSIS — Z7901 Long term (current) use of anticoagulants: Secondary | ICD-10-CM

## 2013-09-12 DIAGNOSIS — I4891 Unspecified atrial fibrillation: Secondary | ICD-10-CM

## 2013-09-12 LAB — POCT INR: INR: 7.7

## 2013-09-17 ENCOUNTER — Ambulatory Visit (INDEPENDENT_AMBULATORY_CARE_PROVIDER_SITE_OTHER): Payer: Medicare Other | Admitting: *Deleted

## 2013-09-17 DIAGNOSIS — Z5181 Encounter for therapeutic drug level monitoring: Secondary | ICD-10-CM

## 2013-09-17 DIAGNOSIS — Z7901 Long term (current) use of anticoagulants: Secondary | ICD-10-CM

## 2013-09-17 DIAGNOSIS — I4891 Unspecified atrial fibrillation: Secondary | ICD-10-CM

## 2013-09-17 LAB — POCT INR: INR: 2.2

## 2013-10-01 ENCOUNTER — Ambulatory Visit (INDEPENDENT_AMBULATORY_CARE_PROVIDER_SITE_OTHER): Payer: Medicare Other | Admitting: *Deleted

## 2013-10-01 DIAGNOSIS — Z7901 Long term (current) use of anticoagulants: Secondary | ICD-10-CM

## 2013-10-01 DIAGNOSIS — I4891 Unspecified atrial fibrillation: Secondary | ICD-10-CM

## 2013-10-01 DIAGNOSIS — Z5181 Encounter for therapeutic drug level monitoring: Secondary | ICD-10-CM

## 2013-10-01 LAB — POCT INR: INR: 3.2

## 2013-10-18 ENCOUNTER — Ambulatory Visit (INDEPENDENT_AMBULATORY_CARE_PROVIDER_SITE_OTHER): Payer: Medicare Other | Admitting: *Deleted

## 2013-10-18 ENCOUNTER — Ambulatory Visit (INDEPENDENT_AMBULATORY_CARE_PROVIDER_SITE_OTHER): Payer: Medicare Other | Admitting: Cardiovascular Disease

## 2013-10-18 ENCOUNTER — Encounter: Payer: Self-pay | Admitting: Cardiovascular Disease

## 2013-10-18 VITALS — BP 143/58 | HR 79 | Ht 67.0 in | Wt 256.0 lb

## 2013-10-18 DIAGNOSIS — Z7901 Long term (current) use of anticoagulants: Secondary | ICD-10-CM

## 2013-10-18 DIAGNOSIS — I4891 Unspecified atrial fibrillation: Secondary | ICD-10-CM

## 2013-10-18 DIAGNOSIS — R5383 Other fatigue: Secondary | ICD-10-CM

## 2013-10-18 DIAGNOSIS — Z5181 Encounter for therapeutic drug level monitoring: Secondary | ICD-10-CM

## 2013-10-18 DIAGNOSIS — I5032 Chronic diastolic (congestive) heart failure: Secondary | ICD-10-CM

## 2013-10-18 DIAGNOSIS — R5381 Other malaise: Secondary | ICD-10-CM

## 2013-10-18 DIAGNOSIS — E785 Hyperlipidemia, unspecified: Secondary | ICD-10-CM

## 2013-10-18 LAB — POCT INR: INR: 2.5

## 2013-10-18 NOTE — Patient Instructions (Signed)
Your physician wants you to follow-up in: 6 months You will receive a reminder letter in the mail two months in advance. If you don't receive a letter, please call our office to schedule the follow-up appointment.    Your physician recommends that you continue on your current medications as directed. Please refer to the Current Medication list given to you today.   Please get fasting blood work   Thank you for choosing [facility] !
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
## 2013-10-19 ENCOUNTER — Encounter: Payer: Self-pay | Admitting: Cardiovascular Disease

## 2013-11-02 LAB — LIPID PANEL
CHOL/HDL RATIO: 4.1 ratio
Cholesterol: 102 mg/dL (ref 0–200)
HDL: 25 mg/dL — ABNORMAL LOW (ref >39)
LDL Cholesterol: 56 mg/dL (ref 0–99)
Triglycerides: 107 mg/dL (ref <150)
VLDL: 21 mg/dL (ref 0–40)

## 2013-11-02 LAB — HEPATIC FUNCTION PANEL
ALT: <8 U/L (ref 0–35)
AST: 18 U/L (ref 0–37)
Albumin: 3.9 g/dL (ref 3.5–5.2)
Alkaline Phosphatase: 107 U/L (ref 39–117)
BILIRUBIN DIRECT: 0.5 mg/dL — AB (ref 0.0–0.3)
BILIRUBIN TOTAL: 1.8 mg/dL — AB (ref 0.2–1.2)
Indirect Bilirubin: 1.3 mg/dL — ABNORMAL HIGH (ref 0.2–1.2)
Total Protein: 7.2 g/dL (ref 6.0–8.3)

## 2013-11-09 IMAGING — US US CAROTID DUPLEX BILAT
1 series · 13 of 24 positions shown · non-contrast
Comparison: None.

CLINICAL DATA: Carotid bruit; history of syncopal episode, diabetes
and hypertension, history of arrhythmia

BILATERAL CAROTID DUPLEX ULTRASOUND
TECHNIQUE: Gray scale imaging, color Doppler and duplex ultrasound
was performed of bilateral carotid and vertebral arteries in the
neck.

[Series 1: us carotid duplex bilat · 0.08mm/px · 13 of 68 slices shown]
[im 1/68]
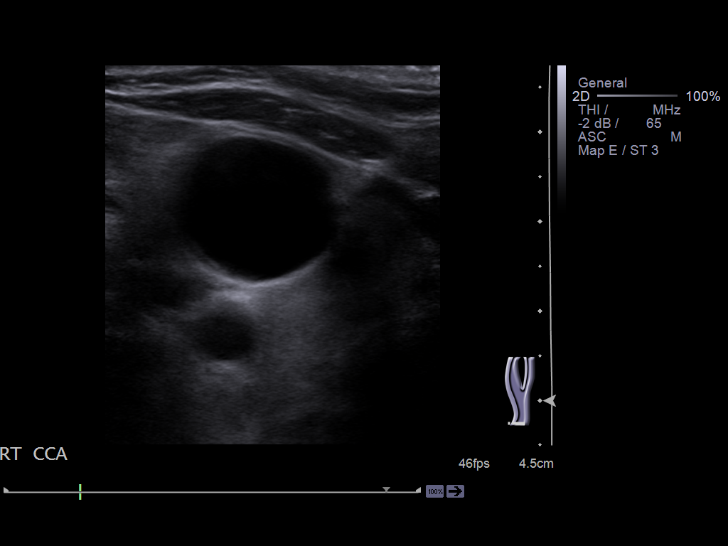
[im 6/68]
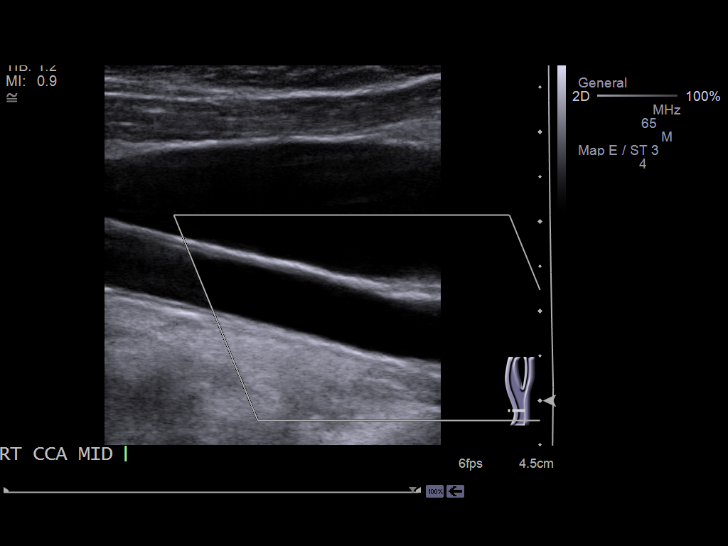
[im 12/68]
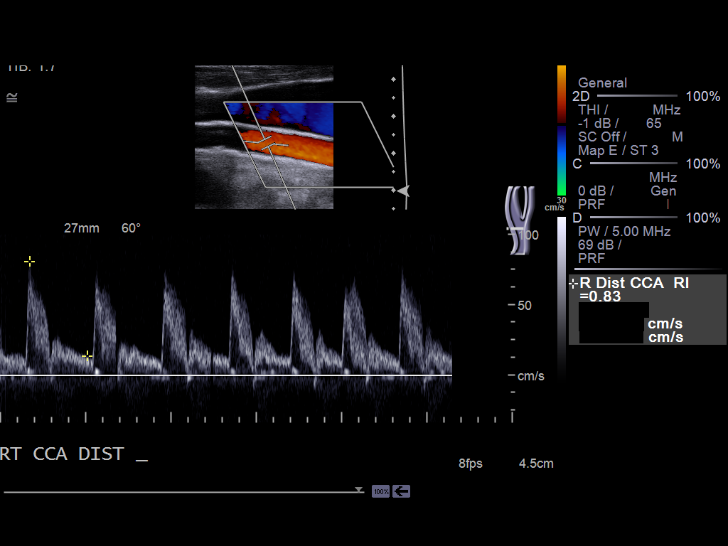
[im 18/68]
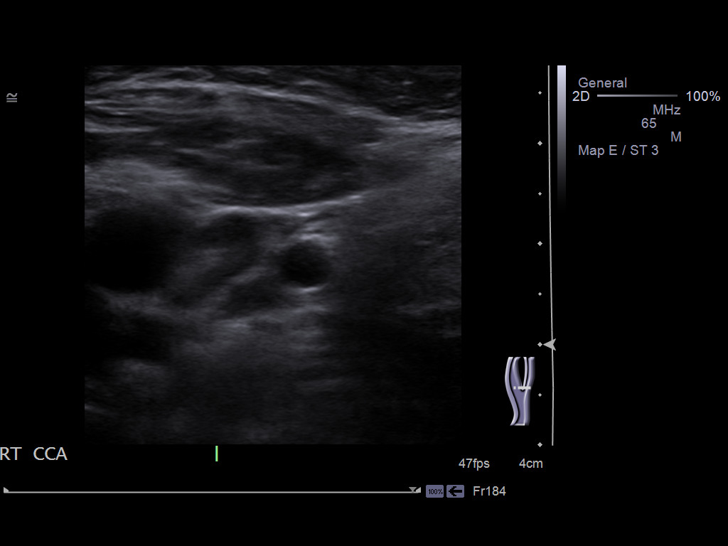
[im 24/68]
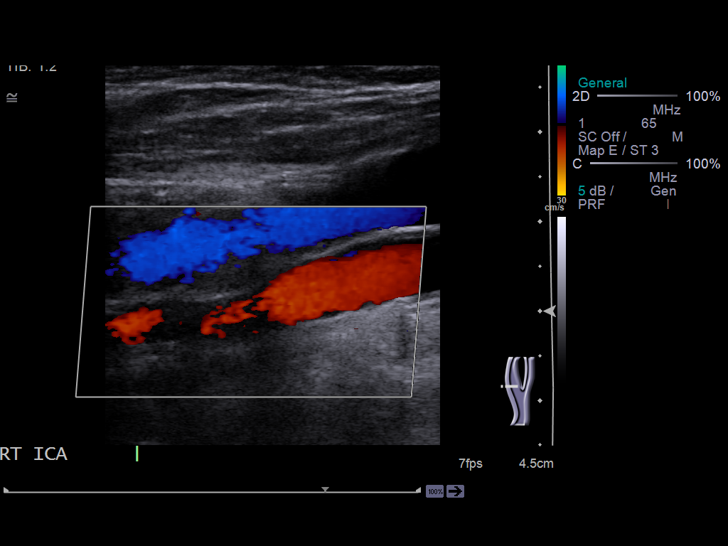
[im 30/68]
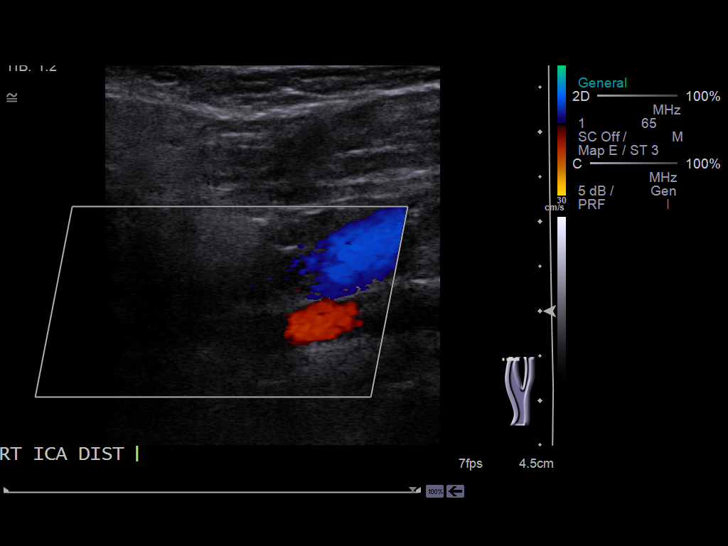
[im 35/68]
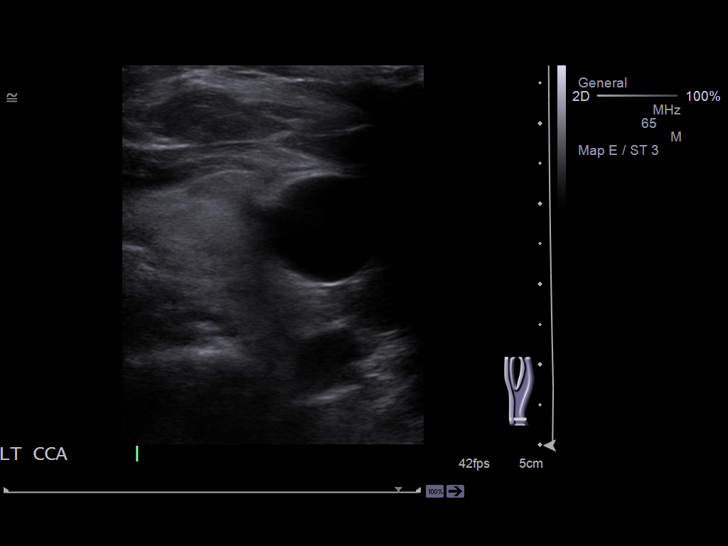
[im 38/68]
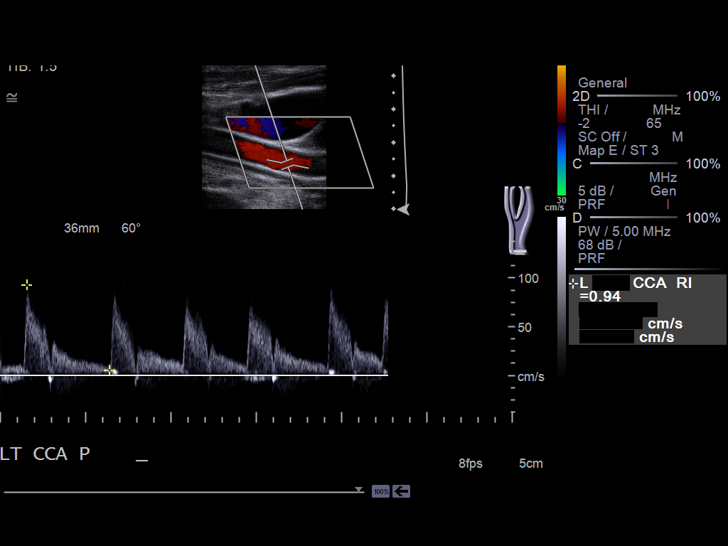
[im 44/68]
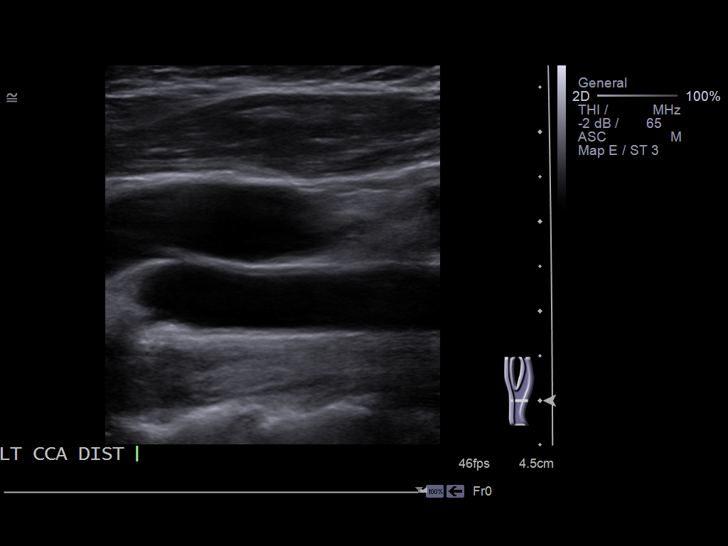
[im 50/68]
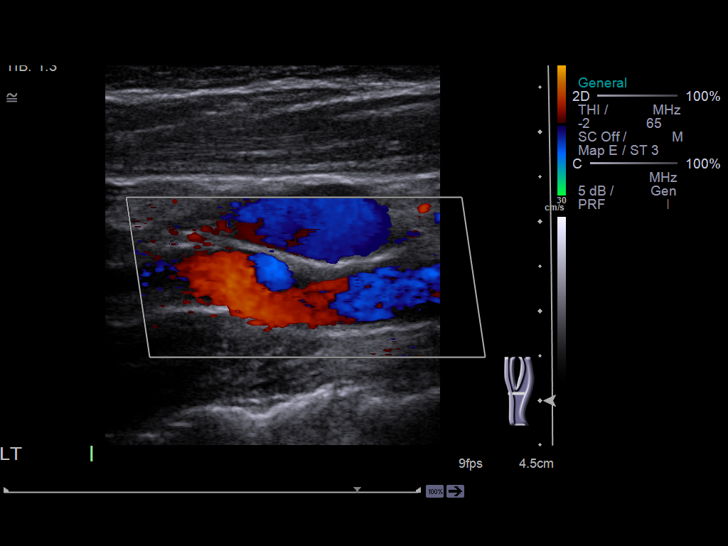
[im 56/68]
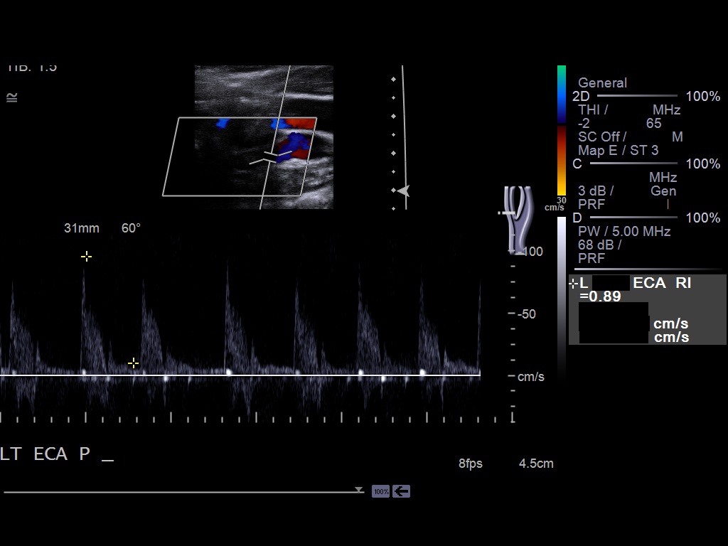
[im 62/68]
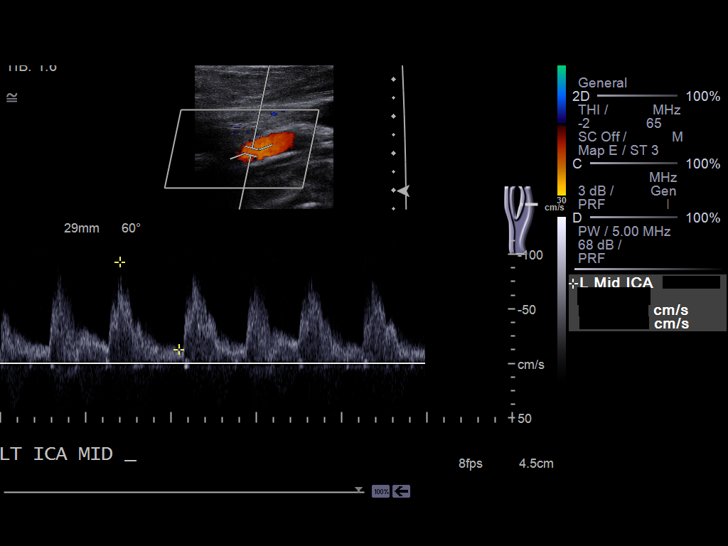
[im 68/68]
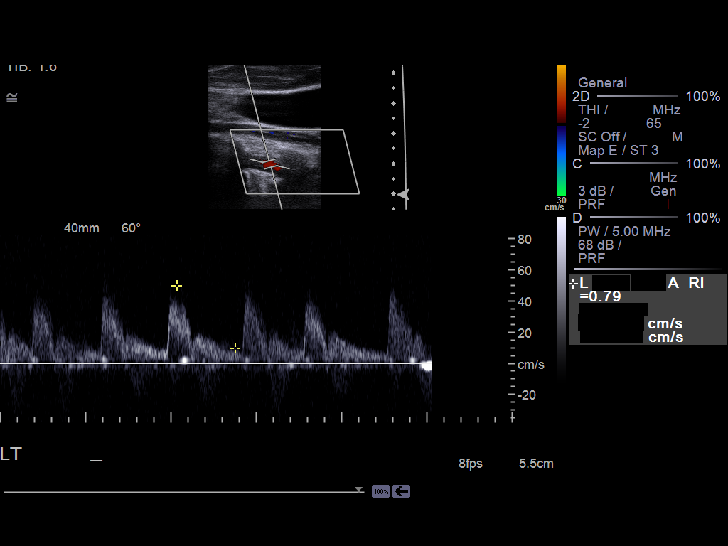

[13 of 24 positions shown; findings below may reference images not displayed]

Criteria:  Quantification of carotid stenosis is based on velocity
parameters that correlate the residual internal carotid diameter
with NASCET-based stenosis levels, using the diameter of the distal
internal carotid lumen as the denominator for stenosis measurement.

The following velocity measurements were obtained:

                 PEAK SYSTOLIC/END DIASTOLIC
RIGHT
ICA:                        97/15cm/sec
CCA:                        85/16cm/sec
SYSTOLIC ICA/CCA RATIO:
DIASTOLIC ICA/CCA RATIO:
ECA:                        01/12cm/sec

LEFT
ICA:                        94/14cm/sec
CCA:                        99/15cm/sec
SYSTOLIC ICA/CCA RATIO:
DIASTOLIC ICA/CCA RATIO:
ECA:                        96cm/sec
FINDINGS: RIGHT CAROTID ARTERY: There is minimal intimal wall thickening
within the carotid bulb, extending to the proximal aspect of the
internal carotid artery, not resulting in hemodynamically
significant stenosis.

RIGHT VERTEBRAL ARTERY:  Preserved antegrade flow.

LEFT CAROTID ARTERY: There is a minimal amount of eccentric
atherosclerotic plaque within the carotid bulb, not resulting in
hemodynamically significant stenosis. Minimal intimal wall
thickening within the proximal aspect of the internal carotid
artery.

LEFT VERTEBRAL ARTERY:  Preserved antegrade flow.
IMPRESSION: Minimal intimal wall thickening and atherosclerotic plaque within
the bilateral carotid bulbs (left greater than right) and proximal
aspect of the bilateral internal carotid arteries, not resulting in
hemodynamically significant stenosis.

## 2013-11-14 ENCOUNTER — Telehealth: Payer: Self-pay | Admitting: Cardiovascular Disease

## 2013-11-14 MED ORDER — METOPROLOL TARTRATE 25 MG PO TABS: ORAL_TABLET | ORAL | Status: DC

## 2013-11-14 MED ORDER — TORSEMIDE 20 MG PO TABS: ORAL_TABLET | ORAL | Status: DC

## 2013-11-14 MED ORDER — ATORVASTATIN CALCIUM 20 MG PO TABS: ORAL_TABLET | ORAL | Status: DC

## 2013-11-14 NOTE — Telephone Encounter (Signed)
Medication sent via escribe.  

## 2013-11-14 NOTE — Telephone Encounter (Signed)
Received fax refill request  Rx # [id number] Medication:  Atorvastatin 20 mg tablets Qty 30 Sig:  Take one tablet by mouth at bedtime Physician:  Bronson Ing Received fax refill request  Rx # [id number] Medication:  Torsemide 20 mg tablet Qty 60 Sig:  Take 2 tablets by mouth every morning Physician:  Bronson Ing Received fax refill request  Rx # [id number] Medication:  Metoprolol 25 mg tablet Qty 30 Sig:  Take 1/2 tablet by mouth twice daily Physician:  Bronson Ing

## 2013-11-15 IMAGING — CR DG CHEST 2V
2 series · 2 of 2 positions shown · non-contrast
Comparison: Chest x-ray 06/14/2011.

CLINICAL DATA: Lower extremity swelling.  Evaluate for potential
pulmonary edema.

CHEST - 2 VIEW

[view not recorded (1 of 2)]
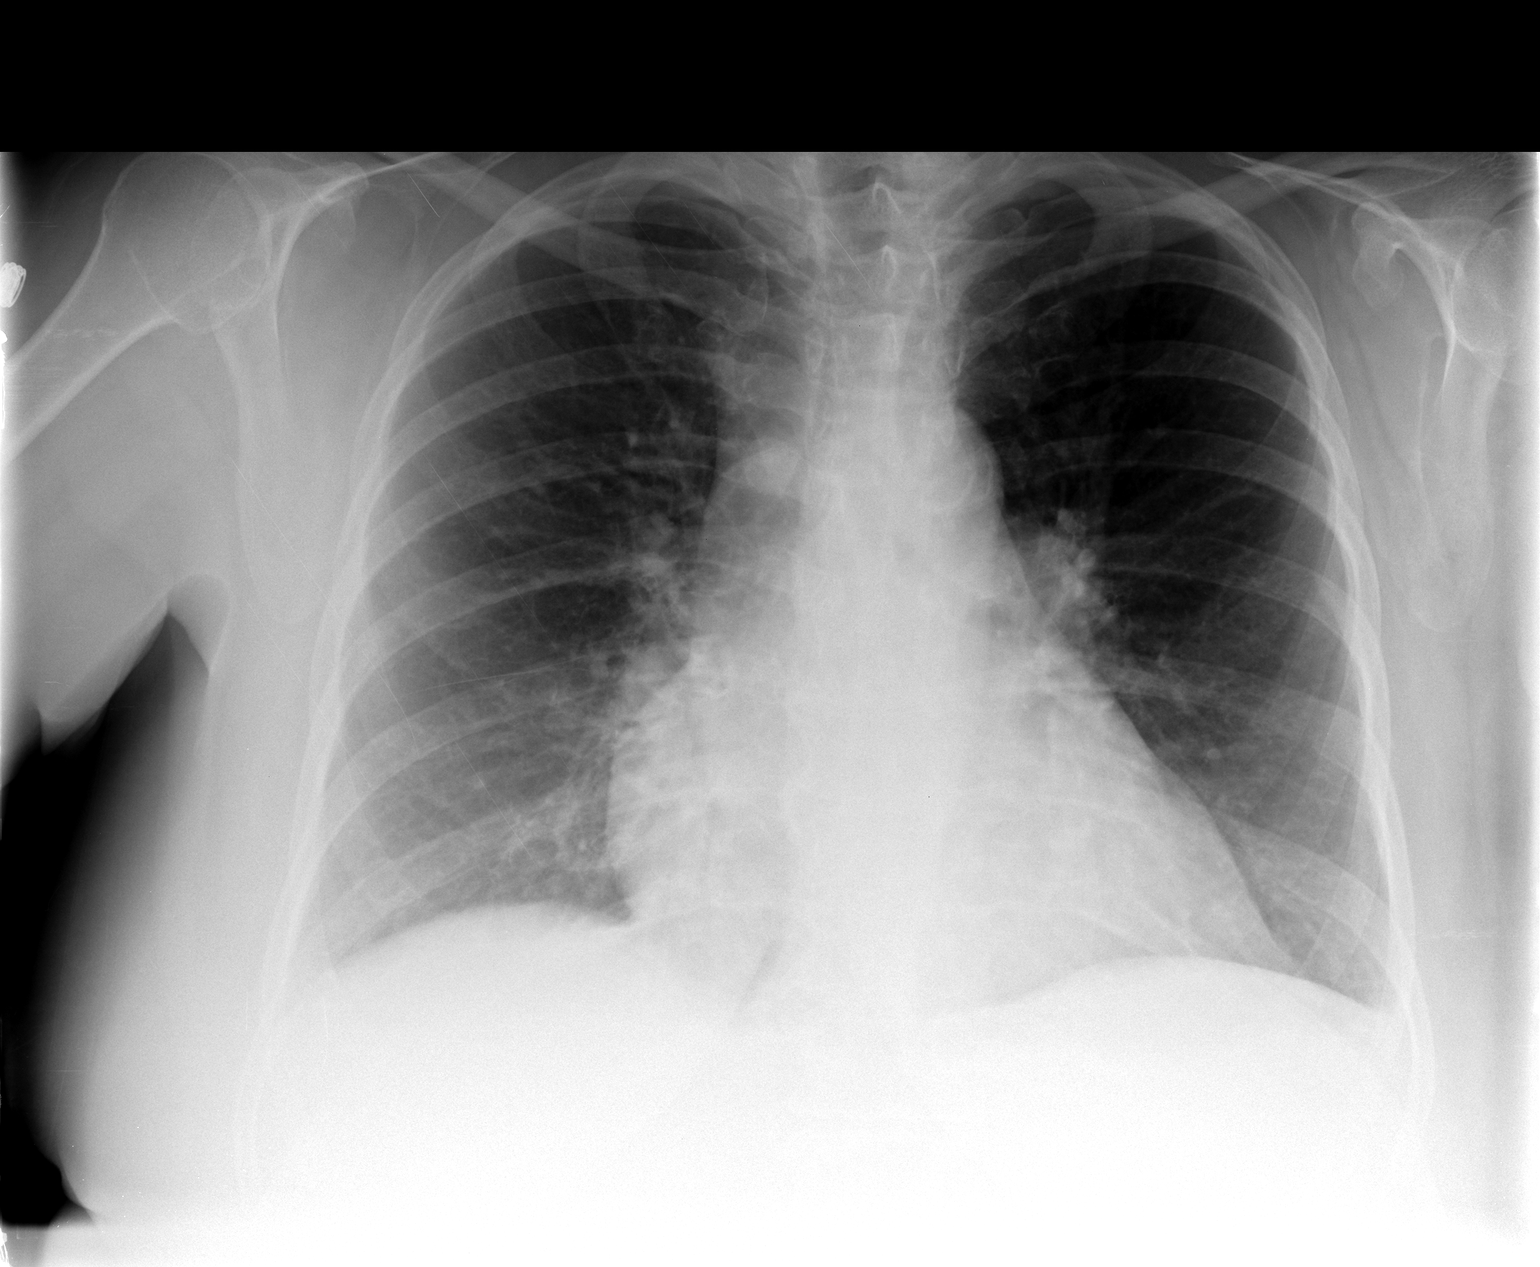

[view not recorded (2 of 2)]
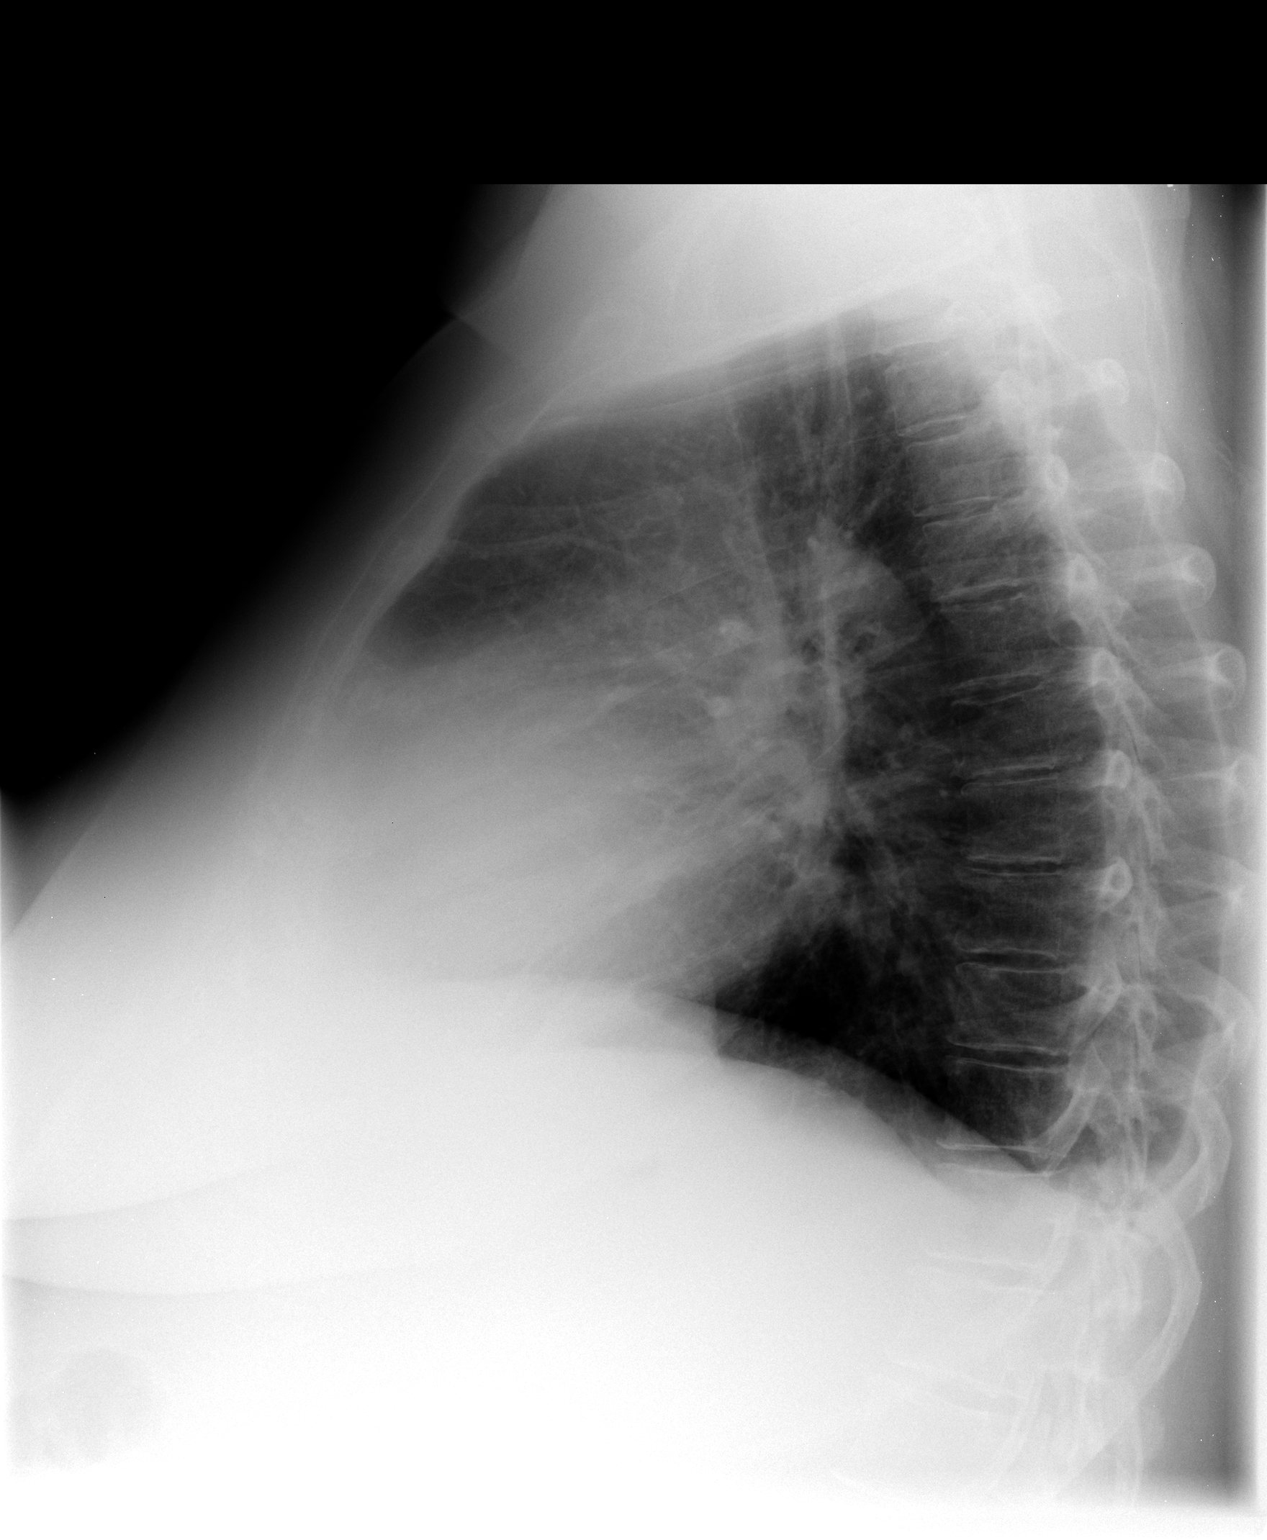

[2 of 2 positions shown; findings below may reference images not displayed]

FINDINGS: Lung volumes are normal.  No consolidative airspace
disease. Lateral view demonstrates blunting of both costophrenic
sulci, which could suggest presence of trace bilateral pleural
effusions.  This is new compared to prior study 01/06/2011.
Pulmonary vasculature is normal.  Heart size is borderline
enlarged, however, there is a prominent right atrial contour which
could suggest right atrial enlargement.  Upper mediastinal contours
are slightly widened (however, they are distorted by patient
rotation to the right and are overall similar compared to prior
studies).  Atherosclerotic calcifications noted in the arch of the
aorta.
IMPRESSION: 1. Trace bilateral pleural effusions.  No other radiographic
evidence of acute cardiopulmonary disease.
2.  Atherosclerosis.
3.  Borderline cardiomegaly with prominent right atrial contour.

## 2013-11-21 ENCOUNTER — Ambulatory Visit (INDEPENDENT_AMBULATORY_CARE_PROVIDER_SITE_OTHER): Payer: Medicare Other | Admitting: *Deleted

## 2013-11-21 DIAGNOSIS — Z7901 Long term (current) use of anticoagulants: Secondary | ICD-10-CM

## 2013-11-21 DIAGNOSIS — I4891 Unspecified atrial fibrillation: Secondary | ICD-10-CM

## 2013-11-21 DIAGNOSIS — Z5181 Encounter for therapeutic drug level monitoring: Secondary | ICD-10-CM

## 2013-11-21 LAB — POCT INR: INR: 3.2

## 2013-12-19 ENCOUNTER — Ambulatory Visit (INDEPENDENT_AMBULATORY_CARE_PROVIDER_SITE_OTHER): Payer: Medicare Other | Admitting: *Deleted

## 2013-12-19 DIAGNOSIS — Z7901 Long term (current) use of anticoagulants: Secondary | ICD-10-CM

## 2013-12-19 DIAGNOSIS — I4891 Unspecified atrial fibrillation: Secondary | ICD-10-CM

## 2013-12-19 DIAGNOSIS — Z5181 Encounter for therapeutic drug level monitoring: Secondary | ICD-10-CM

## 2013-12-19 LAB — POCT INR: INR: 2.9

## 2014-01-16 ENCOUNTER — Ambulatory Visit (INDEPENDENT_AMBULATORY_CARE_PROVIDER_SITE_OTHER): Payer: Medicare Other | Admitting: *Deleted

## 2014-01-16 DIAGNOSIS — Z7901 Long term (current) use of anticoagulants: Secondary | ICD-10-CM

## 2014-01-16 DIAGNOSIS — I4891 Unspecified atrial fibrillation: Secondary | ICD-10-CM

## 2014-01-16 DIAGNOSIS — Z5181 Encounter for therapeutic drug level monitoring: Secondary | ICD-10-CM

## 2014-01-16 LAB — POCT INR: INR: 2.7

## 2014-02-12 ENCOUNTER — Other Ambulatory Visit: Payer: Self-pay | Admitting: Cardiovascular Disease

## 2014-02-13 ENCOUNTER — Telehealth: Payer: Self-pay | Admitting: Adult Health

## 2014-02-13 MED ORDER — VERAPAMIL HCL ER 180 MG PO TBCR: EXTENDED_RELEASE_TABLET | ORAL | Status: DC

## 2014-02-13 NOTE — Telephone Encounter (Signed)
Received fax refill request  Rx # [id number] Medication:  Verapamil 180 mg ER tablet Qty 30 Sig:  Take one tablet by mouth at bedtime Physician:  Purcell Nails

## 2014-02-14 ENCOUNTER — Ambulatory Visit (INDEPENDENT_AMBULATORY_CARE_PROVIDER_SITE_OTHER): Payer: Medicare Other | Admitting: Pharmacist

## 2014-02-14 DIAGNOSIS — Z7901 Long term (current) use of anticoagulants: Secondary | ICD-10-CM

## 2014-02-14 DIAGNOSIS — Z5181 Encounter for therapeutic drug level monitoring: Secondary | ICD-10-CM

## 2014-02-14 DIAGNOSIS — I4891 Unspecified atrial fibrillation: Secondary | ICD-10-CM

## 2014-02-14 LAB — POCT INR: INR: 2.6

## 2014-03-12 ENCOUNTER — Other Ambulatory Visit: Payer: Self-pay | Admitting: Cardiovascular Disease

## 2014-03-27 ENCOUNTER — Ambulatory Visit (INDEPENDENT_AMBULATORY_CARE_PROVIDER_SITE_OTHER): Payer: Medicare Other | Admitting: *Deleted

## 2014-03-27 DIAGNOSIS — Z7901 Long term (current) use of anticoagulants: Secondary | ICD-10-CM

## 2014-03-27 DIAGNOSIS — I4891 Unspecified atrial fibrillation: Secondary | ICD-10-CM

## 2014-03-27 DIAGNOSIS — Z5181 Encounter for therapeutic drug level monitoring: Secondary | ICD-10-CM

## 2014-03-27 LAB — POCT INR: INR: 2.8

## 2014-04-08 ENCOUNTER — Other Ambulatory Visit (HOSPITAL_COMMUNITY): Payer: Self-pay | Admitting: Family Medicine

## 2014-04-08 DIAGNOSIS — Z1231 Encounter for screening mammogram for malignant neoplasm of breast: Secondary | ICD-10-CM

## 2014-04-09 ENCOUNTER — Other Ambulatory Visit (HOSPITAL_COMMUNITY): Payer: Self-pay | Admitting: Family Medicine

## 2014-04-09 DIAGNOSIS — Z09 Encounter for follow-up examination after completed treatment for conditions other than malignant neoplasm: Secondary | ICD-10-CM

## 2014-04-15 ENCOUNTER — Ambulatory Visit (HOSPITAL_COMMUNITY): Payer: Medicare Other

## 2014-04-15 ENCOUNTER — Other Ambulatory Visit: Payer: Self-pay

## 2014-04-15 MED ORDER — SPIRONOLACTONE 25 MG PO TABS: 12.5000 mg | ORAL_TABLET | Freq: Every day | ORAL | Status: DC

## 2014-04-23 ENCOUNTER — Ambulatory Visit (HOSPITAL_COMMUNITY)
Admission: RE | Admit: 2014-04-23 | Discharge: 2014-04-23 | Disposition: A | Discharge status: Home / Self Care | Payer: Medicare Other | Source: Ambulatory Visit | Attending: Family Medicine | Admitting: Family Medicine

## 2014-04-23 ENCOUNTER — Other Ambulatory Visit (HOSPITAL_COMMUNITY): Payer: Self-pay | Admitting: Family Medicine

## 2014-04-23 DIAGNOSIS — N63 Unspecified lump in breast: Secondary | ICD-10-CM | POA: Insufficient documentation

## 2014-04-23 DIAGNOSIS — N6310 Unspecified lump in the right breast, unspecified quadrant: Secondary | ICD-10-CM

## 2014-04-23 DIAGNOSIS — Z09 Encounter for follow-up examination after completed treatment for conditions other than malignant neoplasm: Secondary | ICD-10-CM

## 2014-05-13 ENCOUNTER — Other Ambulatory Visit: Payer: Self-pay | Admitting: Cardiovascular Disease

## 2014-05-13 ENCOUNTER — Encounter: Payer: Medicare Other | Admitting: Adult Health

## 2014-05-13 NOTE — Progress Notes (Signed)
Cancelled appt

## 2014-05-17 NOTE — Progress Notes (Signed)
Error. No Show 

## 2014-05-20 ENCOUNTER — Encounter: Payer: Medicare Other | Admitting: Adult Health

## 2014-05-27 ENCOUNTER — Ambulatory Visit (INDEPENDENT_AMBULATORY_CARE_PROVIDER_SITE_OTHER): Payer: Medicare Other | Admitting: Adult Health

## 2014-05-27 ENCOUNTER — Ambulatory Visit (INDEPENDENT_AMBULATORY_CARE_PROVIDER_SITE_OTHER): Payer: Medicare Other | Admitting: *Deleted

## 2014-05-27 ENCOUNTER — Encounter: Payer: Self-pay | Admitting: Adult Health

## 2014-05-27 VITALS — BP 112/78 | HR 78 | Ht 67.0 in | Wt 280.0 lb

## 2014-05-27 DIAGNOSIS — I4891 Unspecified atrial fibrillation: Secondary | ICD-10-CM

## 2014-05-27 DIAGNOSIS — Z5181 Encounter for therapeutic drug level monitoring: Secondary | ICD-10-CM

## 2014-05-27 DIAGNOSIS — Z7901 Long term (current) use of anticoagulants: Secondary | ICD-10-CM

## 2014-05-27 DIAGNOSIS — I5033 Acute on chronic diastolic (congestive) heart failure: Secondary | ICD-10-CM

## 2014-05-27 LAB — POCT INR: INR: 3.1

## 2014-05-27 NOTE — Assessment & Plan Note (Signed)
Heart rate is well controlled. Continues on coumadin therapy. She sees Lattie Haw today in our coumadin clinic. She is otherwise doing well she is advised to continue current medication.

## 2014-05-27 NOTE — Assessment & Plan Note (Signed)
Well controlled currently. No changes at this time.

## 2014-05-27 NOTE — Progress Notes (Deleted)
Name: Krystal [name]    DOB: 08/03/1945  Age: 68 y.o.  MR#: [id number]       PCP:  Purvis Kilts, MD      Insurance: Payor: [employer] / Plan: BLUE MEDICARE / Product Type: *No Product type* /   CC:    Chief Complaint  Patient presents with  . Atrial Fibrillation  . Hyperlipidemia  . Congestive Heart Failure    Diastolic    VS Filed Vitals:   05/27/14 1355  BP: 112/78  Pulse: 78  Height: 5\' 7"  (1.702 m)  Weight: 280 lb (127.007 kg)    Weights Current Weight  05/27/14 280 lb (127.007 kg)  10/18/13 256 lb (116.121 kg)  04/09/13 248 lb (112.492 kg)    Blood Pressure  BP Readings from Last 3 Encounters:  05/27/14 112/78  10/18/13 143/58  04/09/13 129/76     Admit date:  (Not on file) Last encounter with RMR:  02/13/2014   Allergy Ace inhibitors; Actos; Aspirin; Celecoxib; Diovan; Metformin and related; and Tylenol  Current Outpatient Prescriptions  Medication Sig Dispense Refill  . ALPRAZolam (XANAX) 0.5 MG tablet Take 0.25 mg by mouth at bedtime.     Marland Kitchen atorvastatin (LIPITOR) 20 MG tablet TAKE ONE TABLET BY MOUTH AT BEDTIME. 30 tablet 3  . Cholecalciferol (VITAMIN D3) 2000 UNITS TABS Take 1 tablet by mouth daily.     . citalopram (CELEXA) 20 MG tablet Take 20 mg by mouth at bedtime.     . insulin glargine (LANTUS) 100 UNIT/ML injection Inject 0.15 mLs (15 Units total) into the skin at bedtime. 10 mL 0  . insulin lispro (HUMALOG) 100 UNIT/ML injection Inject 5 Units into the skin 3 (three) times daily before meals. Take 5 units before meals. Adds sliding scale if needed for additional coverage.    . metoprolol tartrate (LOPRESSOR) 25 MG tablet TAKE 1/2 TABLET BY MOUTH TWICE DAILY. 30 tablet 6  . NOVOFINE 30G X 8 MM MISC   2  . Polysaccharide Iron Complex (POLY-IRON 150 PO) Take 150 mg by mouth.    Marland Kitchen SALINE NASAL MIST NA Place 1 spray into the nose daily as needed (for congestion).     Marland Kitchen spironolactone (ALDACTONE) 25 MG tablet Take 0.5  tablets (12.5 mg total) by mouth daily. 90 tablet 1  . torsemide (DEMADEX) 20 MG tablet TAKE 2 TABLETS BY MOUTH EVERY MORNING. 60 tablet 6  . verapamil (CALAN-SR) 180 MG CR tablet TAKE (1) TABLET BY MOUTH AT BEDTIME. 30 tablet 3  . warfarin (COUMADIN) 5 MG tablet Take 1 tablet daily except 1 1/2 tablets on Mondays, Wednesdays and Fridays or as directed 45 tablet 3   No current facility-administered medications for this visit.    Discontinued Meds:   There are no discontinued medications.  Patient Active Problem List   Diagnosis Date Noted  . Encounter for therapeutic drug monitoring 08/22/2013  . Rash and nonspecific skin eruption 04/09/2013  . Sepsis 09/03/2012  . Acute renal failure 09/03/2012  . Hypokalemia 09/02/2012  . Weakness 09/02/2012  . CKD (chronic kidney disease), stage IV 09/02/2012  . Nonalcoholic fatty liver disease 09/02/2012  . Encounter for long-term (current) use of anticoagulants 06/28/2011  . Conjunctival hemorrhage of left eye 06/25/2011  . Anemia, iron deficiency 06/21/2011  . Thrombocytopenia 06/21/2011  . Diastolic CHF, acute on chronic 06/21/2011  . Hyponatremia 06/21/2011  . Atrial fibrillation 06/14/2011  . Syncope and collapse 01/27/2011  . DIABETES MELLITUS, TYPE  II 04/25/2009  . OBESITY 04/25/2009  . HYPERTENSION 12/19/2008  . DYSPNEA 12/19/2008    LABS    Component Value Date/Time   NA 138 05/07/2013 1441   NA 138 04/20/2013 1158   NA 137 04/09/2013 1452   K 3.9 05/07/2013 1441   K 4.4 04/20/2013 1158   K 4.8 04/09/2013 1452   CL 100 05/07/2013 1441   CL 101 04/20/2013 1158   CL 98 04/09/2013 1452   CO2 25 05/07/2013 1441   CO2 30 04/20/2013 1158   CO2 27 04/09/2013 1452   GLUCOSE 134* 05/07/2013 1441   GLUCOSE 144* 04/20/2013 1158   GLUCOSE 114* 04/09/2013 1452   BUN 33* 05/07/2013 1441   BUN 40* 04/20/2013 1158   BUN 53* 04/09/2013 1452   CREATININE 1.56* 05/07/2013 1441   CREATININE 1.88* 04/20/2013 1158   CREATININE 1.92*  04/09/2013 1452   CREATININE 1.96* 10/14/2012 0933   CREATININE 1.69* 09/18/2012 1531   CREATININE 1.83* 09/08/2012 0449   CALCIUM 9.6 05/07/2013 1441   CALCIUM 9.0 04/20/2013 1158   CALCIUM 10.0 04/09/2013 1452   GFRNONAA 26* 10/14/2012 0933   GFRNONAA 31* 09/18/2012 1531   GFRNONAA 28* 09/08/2012 0449   GFRAA 30* 10/14/2012 0933   GFRAA 36* 09/18/2012 1531   GFRAA 32* 09/08/2012 0449   CMP     Component Value Date/Time   NA 138 05/07/2013 1441   K 3.9 05/07/2013 1441   CL 100 05/07/2013 1441   CO2 25 05/07/2013 1441   GLUCOSE 134* 05/07/2013 1441   BUN 33* 05/07/2013 1441   CREATININE 1.56* 05/07/2013 1441   CREATININE 1.96* 10/14/2012 0933   CALCIUM 9.6 05/07/2013 1441   PROT 7.2 11/02/2013 1200   ALBUMIN 3.9 11/02/2013 1200   AST 18 11/02/2013 1200   ALT <8 11/02/2013 1200   ALKPHOS 107 11/02/2013 1200   BILITOT 1.8* 11/02/2013 1200   GFRNONAA 26* 10/14/2012 0933   GFRAA 30* 10/14/2012 0933       Component Value Date/Time   WBC 8.7 04/09/2013 1452   WBC 7.6 10/14/2012 0933   WBC 6.5 09/18/2012 1531   HGB 14.0 04/09/2013 1452   HGB 10.8* 10/14/2012 0933   HGB 9.0* 09/18/2012 1531   HCT 41.6 04/09/2013 1452   HCT 34.6* 10/14/2012 0933   HCT 28.7* 09/18/2012 1531   MCV 90.2 04/09/2013 1452   MCV 86.7 10/14/2012 0933   MCV 87.8 09/18/2012 1531    Lipid Panel     Component Value Date/Time   CHOL 102 11/02/2013 1200   TRIG 107 11/02/2013 1200   HDL 25* 11/02/2013 1200   CHOLHDL 4.1 11/02/2013 1200   VLDL 21 11/02/2013 1200   LDLCALC 56 11/02/2013 1200    ABG    Component Value Date/Time   PHART 7.485* 07/28/2011 0455   PCO2ART 46.4* 07/28/2011 0455   PO2ART 86.0 07/28/2011 0455   HCO3 34.6* 07/28/2011 0455   TCO2 24 08/05/2011 1121   O2SAT 97.0 07/28/2011 0455     Lab Results  Component Value Date   TSH 1.553 07/22/2011   BNP (last 3 results) No results for input(s): PROBNP in the last 8760 hours. Cardiac Panel (last 3 results) No results  for input(s): CKTOTAL, CKMB, TROPONINI, RELINDX in the last 72 hours.  Iron/TIBC/Ferritin/ %Sat    Component Value Date/Time   IRON 35* 07/23/2011 1756   TIBC 330 07/23/2011 1756   FERRITIN 120 07/23/2011 1756   IRONPCTSAT 11* 07/23/2011 1756     EKG Orders placed  or performed in visit on 04/09/13  . EKG 12-Lead     Prior Assessment and Plan Problem List as of 05/27/2014      Cardiovascular and Mediastinum   HYPERTENSION   Last Assessment & Plan   03/15/2012 Office Visit Written 03/15/2012  1:34 PM by Imogene Burn, PA    Controlled    Syncope and collapse   Last Assessment & Plan   01/27/2011 Office Visit Written 01/27/2011  3:01 PM by Imogene Burn, PA    Patient had an episode of syncope while returning her grocery cart 3 weeks ago. Workup in the ER was negative except for hemoglobin of 10.7. She is having her CBC repeated today. Patient does have a carotid bruit, Systolic murmur, with history of moderate MR 2 years ago, and should have carotid Dopplers, 2-D echo, and stress Myoview. Unfortunately she does not have insurance and she does not qualify for the [facility] discount because she has a [place] as her only source of income. She does not turned 66 until next year. She is quite tearful about this situation but truly cannot afford to have these tests performed.    Atrial fibrillation   Last Assessment & Plan   04/09/2013 Office Visit Written 04/09/2013  2:05 PM by Lendon Colonel, NP    Rate is controlled. INR checked today by coumadin clinic 1.90. No evidence of bleeding. Continue current medications as directed, with coumadin adjustments as per coumadin clinic.    Diastolic CHF, acute on chronic   Last Assessment & Plan   04/09/2013 Office Visit Written 04/09/2013  2:06 PM by Lendon Colonel, NP    No evidence of decompensation. Wt essentially the same. Continue medications. CBC and BMET are ordered.       Digestive   Nonalcoholic fatty liver disease      Endocrine   DIABETES MELLITUS, TYPE II   Last Assessment & Plan   11/03/2011 Office Visit Written 11/03/2011 11:14 AM by Imogene Burn, PA    Patient sugars have been running on the high side recently and she is scheduled to see an endocrinologist next month.      Musculoskeletal and Integument   Rash and nonspecific skin eruption   Last Assessment & Plan   04/09/2013 Office Visit Written 04/09/2013  2:09 PM by Lendon Colonel, NP    Located on the right neck, scalp and shoulder. Appears to be shingles but uncertain,. No significant pain with the exception of pain from neck into head behind right eye. I have asked Dr. Bronson Ing to look at rash for recommendations. Typical and Atypical features of shingles. Will begin gabapentin 300 mg TID for one week. If pain does not improve or rash worsens, she is advised to see her PCP.      Genitourinary   CKD (chronic kidney disease), stage IV   Acute renal failure     Other   OBESITY   DYSPNEA   Last Assessment & Plan   01/08/2013 Office Visit Written 01/08/2013  4:54 PM by Lendon Colonel, NP    This is improved with weight loss and fluid management. Continue her current medication regimen.    Anemia, iron deficiency   Last Assessment & Plan   08/10/2011 Office Visit Written 08/10/2011  4:25 PM by Lendon Colonel, NP    Most recent Hbg 11.9 with Hct 35.0. She will continue iron replacement and follow-up with PCP. No active bleeding.    Thrombocytopenia  Hyponatremia   Conjunctival hemorrhage of left eye   Encounter for long-term (current) use of anticoagulants   Hypokalemia   Last Assessment & Plan   09/21/2012 Office Visit Written 09/21/2012  2:41 PM by Lendon Colonel, NP    Potassium has gotten much better at 4.5. She was given potassium replacement in the ER. Now that she is on spironolactone we will not add potassium replacement until followup labs are completed.    Weakness   Sepsis   Encounter for therapeutic drug  monitoring       Imaging: No results found.

## 2014-05-27 NOTE — Patient Instructions (Signed)
Your physician wants you to follow-up in: 6 MONTHS You will receive a reminder letter in the mail two months in advance. If you don't receive a letter, please call our office to schedule the follow-up appointment.  Your physician recommends that you continue on your current medications as directed. Please refer to the Current Medication list given to you today.  WE HAVE GIVEN YOU A SCRIPT TO ADD TSH TO YOUR LAB WORK.   Thank you for choosing [facility]!!

## 2014-05-27 NOTE — Assessment & Plan Note (Signed)
She does not appear to be fluid overloaded despite weight gain. She admits to eating more over the holidays and not being as active. I have advised her to increase her activity. I will had TSH to labs ordered by PCP.
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
## 2014-07-24 ENCOUNTER — Ambulatory Visit (INDEPENDENT_AMBULATORY_CARE_PROVIDER_SITE_OTHER): Payer: Medicare Other | Admitting: *Deleted

## 2014-07-24 DIAGNOSIS — Z7901 Long term (current) use of anticoagulants: Secondary | ICD-10-CM

## 2014-07-24 DIAGNOSIS — Z5181 Encounter for therapeutic drug level monitoring: Secondary | ICD-10-CM

## 2014-07-24 DIAGNOSIS — I480 Paroxysmal atrial fibrillation: Secondary | ICD-10-CM

## 2014-07-24 DIAGNOSIS — I4891 Unspecified atrial fibrillation: Secondary | ICD-10-CM

## 2014-07-24 LAB — POCT INR: INR: 2.5

## 2014-07-29 ENCOUNTER — Encounter (HOSPITAL_COMMUNITY): Payer: Self-pay | Admitting: *Deleted

## 2014-07-29 ENCOUNTER — Emergency Department (HOSPITAL_COMMUNITY)
Admission: EM | Admit: 2014-07-29 | Discharge: 2014-07-29 | Disposition: A | Discharge status: Home / Self Care | Payer: Medicare Other | Attending: Emergency Medicine | Admitting: Emergency Medicine

## 2014-07-29 ENCOUNTER — Other Ambulatory Visit: Payer: Self-pay | Admitting: Cardiovascular Disease

## 2014-07-29 ENCOUNTER — Emergency Department (HOSPITAL_COMMUNITY): Payer: Medicare Other

## 2014-07-29 ENCOUNTER — Telehealth: Payer: Self-pay | Admitting: Cardiovascular Disease

## 2014-07-29 DIAGNOSIS — Z7901 Long term (current) use of anticoagulants: Secondary | ICD-10-CM | POA: Insufficient documentation

## 2014-07-29 DIAGNOSIS — Z794 Long term (current) use of insulin: Secondary | ICD-10-CM | POA: Insufficient documentation

## 2014-07-29 DIAGNOSIS — M791 Myalgia: Secondary | ICD-10-CM | POA: Insufficient documentation

## 2014-07-29 DIAGNOSIS — I4891 Unspecified atrial fibrillation: Secondary | ICD-10-CM | POA: Insufficient documentation

## 2014-07-29 DIAGNOSIS — I10 Essential (primary) hypertension: Secondary | ICD-10-CM | POA: Diagnosis not present

## 2014-07-29 DIAGNOSIS — Z862 Personal history of diseases of the blood and blood-forming organs and certain disorders involving the immune mechanism: Secondary | ICD-10-CM | POA: Diagnosis not present

## 2014-07-29 DIAGNOSIS — I5033 Acute on chronic diastolic (congestive) heart failure: Secondary | ICD-10-CM | POA: Diagnosis not present

## 2014-07-29 DIAGNOSIS — H409 Unspecified glaucoma: Secondary | ICD-10-CM | POA: Insufficient documentation

## 2014-07-29 DIAGNOSIS — M79604 Pain in right leg: Secondary | ICD-10-CM | POA: Diagnosis present

## 2014-07-29 DIAGNOSIS — Z79899 Other long term (current) drug therapy: Secondary | ICD-10-CM | POA: Diagnosis not present

## 2014-07-29 DIAGNOSIS — F329 Major depressive disorder, single episode, unspecified: Secondary | ICD-10-CM | POA: Diagnosis not present

## 2014-07-29 DIAGNOSIS — Z87828 Personal history of other (healed) physical injury and trauma: Secondary | ICD-10-CM | POA: Insufficient documentation

## 2014-07-29 DIAGNOSIS — E119 Type 2 diabetes mellitus without complications: Secondary | ICD-10-CM | POA: Diagnosis not present

## 2014-07-29 DIAGNOSIS — Z8542 Personal history of malignant neoplasm of other parts of uterus: Secondary | ICD-10-CM | POA: Insufficient documentation

## 2014-07-29 DIAGNOSIS — E669 Obesity, unspecified: Secondary | ICD-10-CM | POA: Diagnosis not present

## 2014-07-29 LAB — CBC WITH DIFFERENTIAL/PLATELET
BASOS ABS: 0 10*3/uL (ref 0.0–0.1)
Basophils Relative: 0 % (ref 0–1)
EOS ABS: 0.1 10*3/uL (ref 0.0–0.7)
EOS PCT: 1 % (ref 0–5)
HEMATOCRIT: 35.5 % — AB (ref 36.0–46.0)
HEMOGLOBIN: 10.9 g/dL — AB (ref 12.0–15.0)
Lymphocytes Relative: 14 % (ref 12–46)
Lymphs Abs: 1.2 10*3/uL (ref 0.7–4.0)
MCH: 28.3 pg (ref 26.0–34.0)
MCHC: 30.7 g/dL (ref 30.0–36.0)
MCV: 92.2 fL (ref 78.0–100.0)
MONO ABS: 0.8 10*3/uL (ref 0.1–1.0)
Monocytes Relative: 10 % (ref 3–12)
NEUTROS PCT: 75 % (ref 43–77)
Neutro Abs: 6.5 10*3/uL (ref 1.7–7.7)
Platelets: 158 10*3/uL (ref 150–400)
RBC: 3.85 MIL/uL — AB (ref 3.87–5.11)
RDW: 16.8 % — ABNORMAL HIGH (ref 11.5–15.5)
WBC: 8.7 10*3/uL (ref 4.0–10.5)

## 2014-07-29 LAB — HEPATIC FUNCTION PANEL
ALT: 7 U/L (ref 0–35)
AST: 20 U/L (ref 0–37)
Albumin: 4.1 g/dL (ref 3.5–5.2)
Alkaline Phosphatase: 99 U/L (ref 39–117)
BILIRUBIN INDIRECT: 1.7 mg/dL — AB (ref 0.3–0.9)
BILIRUBIN TOTAL: 2.3 mg/dL — AB (ref 0.3–1.2)
Bilirubin, Direct: 0.6 mg/dL — ABNORMAL HIGH (ref 0.0–0.5)
Total Protein: 7 g/dL (ref 6.0–8.3)

## 2014-07-29 LAB — PROTIME-INR
INR: 2.28 — ABNORMAL HIGH (ref 0.00–1.49)
Prothrombin Time: 25.3 seconds — ABNORMAL HIGH (ref 11.6–15.2)

## 2014-07-29 LAB — BASIC METABOLIC PANEL
ANION GAP: 8 (ref 5–15)
BUN: 23 mg/dL (ref 6–23)
CHLORIDE: 104 mmol/L (ref 96–112)
CO2: 27 mmol/L (ref 19–32)
Calcium: 8.8 mg/dL (ref 8.4–10.5)
Creatinine, Ser: 1.58 mg/dL — ABNORMAL HIGH (ref 0.50–1.10)
GFR calc Af Amer: 38 mL/min — ABNORMAL LOW (ref >90)
GFR calc non Af Amer: 33 mL/min — ABNORMAL LOW (ref >90)
Glucose, Bld: 106 mg/dL — ABNORMAL HIGH (ref 70–99)
POTASSIUM: 2.9 mmol/L — AB (ref 3.5–5.1)
Sodium: 139 mmol/L (ref 135–145)

## 2014-07-29 MED ORDER — POTASSIUM CHLORIDE CRYS ER 20 MEQ PO TBCR
40.0000 meq | EXTENDED_RELEASE_TABLET | Freq: Once | ORAL | Status: AC
  Administered 2014-07-29: 40 meq via ORAL
  Filled 2014-07-29: qty 2

## 2014-07-29 MED ORDER — ENOXAPARIN SODIUM 150 MG/ML ~~LOC~~ SOLN
1.0000 mg/kg | Freq: Once | SUBCUTANEOUS | Status: DC
  Filled 2014-07-29: qty 1

## 2014-07-29 MED ORDER — OXYCODONE HCL 5 MG PO TABS
10.0000 mg | ORAL_TABLET | Freq: Once | ORAL | Status: AC
  Administered 2014-07-29: 10 mg via ORAL
  Filled 2014-07-29: qty 2

## 2014-07-29 MED ORDER — ATORVASTATIN CALCIUM 20 MG PO TABS: 20.0000 mg | ORAL_TABLET | Freq: Every day | ORAL | Status: DC

## 2014-07-29 MED ORDER — OXYCODONE HCL 5 MG PO TABS: 5.0000 mg | ORAL_TABLET | ORAL | Status: DC | PRN

## 2014-07-29 NOTE — Telephone Encounter (Signed)
Needs Atorvastatin called in to [facility] / tgs

## 2014-07-29 NOTE — ED Notes (Signed)
Informed pt and spouse to be here tomorrow 07/30/14 at 50 for ultrasound of lower rt leg at 1130am. Pt and husband verbalized understanding.

## 2014-07-29 NOTE — ED Notes (Addendum)
Pt was recently treated for infection on right foot on 2/2 and finsihed antibiotics yesterday. Pt foot is still swollen and red. Pt called PCP and advised her to come here.  Swelling is noted to right foot. Pt cannot put weight on that leg and states she is having leg pain to upper knee. NAD noted at this time.  Per EMS, CBG 120.

## 2014-07-29 NOTE — ED Notes (Signed)
EDP wants to hold off on lovenox at this time until pt's pt/inr results from lab.

## 2014-07-29 NOTE — ED Notes (Signed)
MD at bedside. 
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
## 2014-07-29 NOTE — Telephone Encounter (Signed)
Received fax refill request  Rx # [id number] Medication:  Atorvastain 20 mg tablets Qty 30 Sig:  Take one tablet by mouth at bedtime Physician:  Bronson Ing

## 2014-07-29 NOTE — Telephone Encounter (Signed)
esceibed rx complete

## 2014-07-30 ENCOUNTER — Ambulatory Visit (HOSPITAL_COMMUNITY): Admit: 2014-07-30 | Payer: Medicare Other

## 2014-07-30 ENCOUNTER — Other Ambulatory Visit (HOSPITAL_COMMUNITY): Payer: Self-pay | Admitting: Emergency Medicine

## 2014-07-30 DIAGNOSIS — R2240 Localized swelling, mass and lump, unspecified lower limb: Secondary | ICD-10-CM

## 2014-07-31 ENCOUNTER — Ambulatory Visit (HOSPITAL_COMMUNITY)
Admission: RE | Admit: 2014-07-31 | Discharge: 2014-07-31 | Disposition: A | Discharge status: Home / Self Care | Payer: Medicare Other | Source: Ambulatory Visit | Attending: Emergency Medicine | Admitting: Emergency Medicine

## 2014-07-31 DIAGNOSIS — M79604 Pain in right leg: Secondary | ICD-10-CM | POA: Diagnosis not present

## 2014-07-31 DIAGNOSIS — I8391 Asymptomatic varicose veins of right lower extremity: Secondary | ICD-10-CM | POA: Insufficient documentation

## 2014-07-31 DIAGNOSIS — M7989 Other specified soft tissue disorders: Secondary | ICD-10-CM | POA: Diagnosis not present

## 2014-07-31 DIAGNOSIS — R2240 Localized swelling, mass and lump, unspecified lower limb: Secondary | ICD-10-CM

## 2014-08-14 ENCOUNTER — Other Ambulatory Visit: Payer: Self-pay | Admitting: Cardiovascular Disease

## 2014-08-14 NOTE — Telephone Encounter (Signed)
Please see refill bin for Prior Authorization / tgs

## 2014-08-15 ENCOUNTER — Telehealth: Payer: Self-pay | Admitting: Cardiovascular Disease

## 2014-08-15 NOTE — Telephone Encounter (Signed)
Please see refill bin for Prior Authorization / tgs

## 2014-08-21 ENCOUNTER — Ambulatory Visit (INDEPENDENT_AMBULATORY_CARE_PROVIDER_SITE_OTHER): Payer: Medicare Other | Admitting: *Deleted

## 2014-08-21 DIAGNOSIS — Z5181 Encounter for therapeutic drug level monitoring: Secondary | ICD-10-CM

## 2014-08-21 DIAGNOSIS — I4891 Unspecified atrial fibrillation: Secondary | ICD-10-CM

## 2014-08-21 DIAGNOSIS — Z7901 Long term (current) use of anticoagulants: Secondary | ICD-10-CM

## 2014-08-21 DIAGNOSIS — I480 Paroxysmal atrial fibrillation: Secondary | ICD-10-CM

## 2014-08-21 LAB — POCT INR: INR: 2.9

## 2014-08-26 ENCOUNTER — Telehealth: Payer: Self-pay | Admitting: Cardiovascular Disease

## 2014-08-26 ENCOUNTER — Other Ambulatory Visit: Payer: Self-pay | Admitting: Cardiovascular Disease

## 2014-08-26 NOTE — Telephone Encounter (Signed)
Please see refill bin for PA / tg

## 2014-08-26 NOTE — Telephone Encounter (Signed)
Please see refill bin for prior authorization  /tg

## 2014-09-02 NOTE — Telephone Encounter (Signed)
PA faxed to [employer]

## 2014-09-04 ENCOUNTER — Telehealth: Payer: Self-pay | Admitting: *Deleted

## 2014-09-04 NOTE — Telephone Encounter (Signed)
Received call from [facility] stating that Torsemide has been approved x 1 year effective 09/02/2014 and the member has been notified.

## 2014-09-09 ENCOUNTER — Telehealth: Payer: Self-pay

## 2014-09-09 NOTE — Telephone Encounter (Signed)
Torsemide approved thru [place]  [id number] from 09/02/14 thru 09/02/15 Per [place]  Will notify [facility]

## 2014-09-18 ENCOUNTER — Ambulatory Visit (INDEPENDENT_AMBULATORY_CARE_PROVIDER_SITE_OTHER): Payer: Medicare Other | Admitting: *Deleted

## 2014-09-18 DIAGNOSIS — I4891 Unspecified atrial fibrillation: Secondary | ICD-10-CM | POA: Diagnosis not present

## 2014-09-18 DIAGNOSIS — Z7901 Long term (current) use of anticoagulants: Secondary | ICD-10-CM | POA: Diagnosis not present

## 2014-09-18 DIAGNOSIS — I480 Paroxysmal atrial fibrillation: Secondary | ICD-10-CM | POA: Diagnosis not present

## 2014-09-18 DIAGNOSIS — Z5181 Encounter for therapeutic drug level monitoring: Secondary | ICD-10-CM | POA: Diagnosis not present

## 2014-09-18 LAB — POCT INR: INR: 2.6

## 2014-09-20 ENCOUNTER — Other Ambulatory Visit: Payer: Self-pay | Admitting: Cardiovascular Disease

## 2014-10-29 ENCOUNTER — Other Ambulatory Visit: Payer: Self-pay | Admitting: Adult Health

## 2014-11-09 ENCOUNTER — Other Ambulatory Visit: Payer: Self-pay | Admitting: Cardiovascular Disease

## 2014-12-12 ENCOUNTER — Other Ambulatory Visit: Payer: Self-pay | Admitting: Cardiovascular Disease

## 2014-12-17 ENCOUNTER — Other Ambulatory Visit: Payer: Self-pay | Admitting: Cardiovascular Disease

## 2015-01-26 IMAGING — CR DG CHEST 1V PORT
1 series · 1 of 1 positions shown · non-contrast
Comparison: 07/25/2011

CLINICAL DATA: Nausea, vomiting, diarrhea

PORTABLE CHEST - 1 VIEW

[view not recorded]
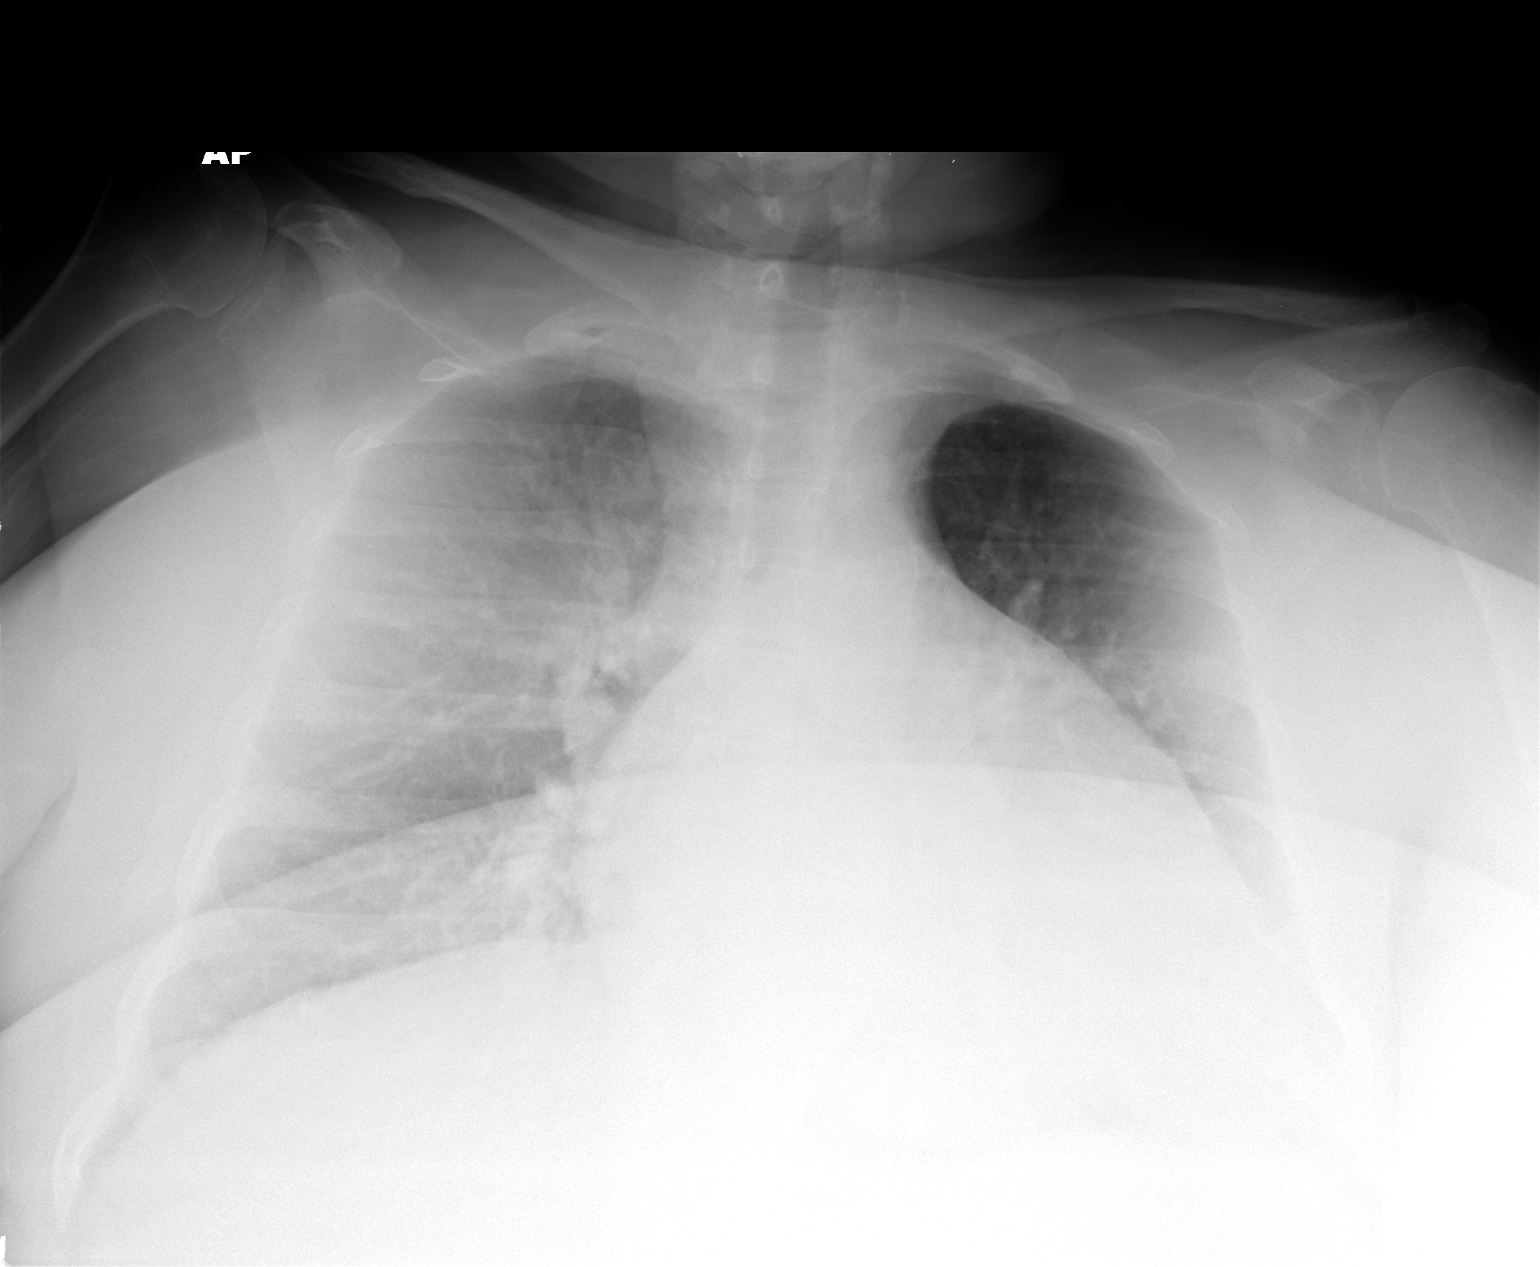

[1 of 1 positions shown; findings below may reference images not displayed]

FINDINGS: Study is limited by patient's large body habitus and poor
inspiration.  Cardiomegaly again noted.  Central vascular
congestion without convincing pulmonary edema.  Bilateral basilar
atelectasis or infiltrate.
IMPRESSION: Limited study as described above.  Central vascular congestion
without convincing pulmonary edema.  Bilateral basilar atelectasis
or infiltrate.  Cardiomegaly again noted.

## 2015-01-28 IMAGING — US US RENAL
1 series · 14 of 25 positions shown · non-contrast
Comparison: 06/15/2011

CLINICAL DATA: Sepsis and acute renal failure.  History of chronic
kidney disease.

RENAL/URINARY TRACT ULTRASOUND COMPLETE

[Series 1: us renal · 0.29mm/px · 14 of 50 slices shown]
[im 1/50]
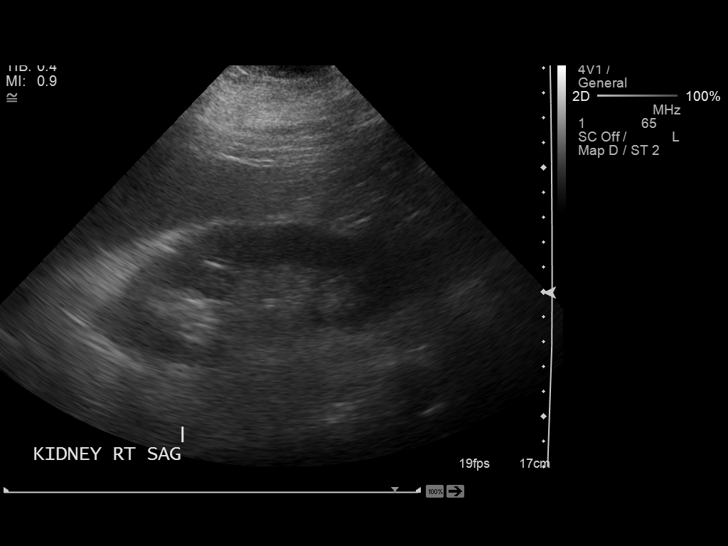
[im 5/50]
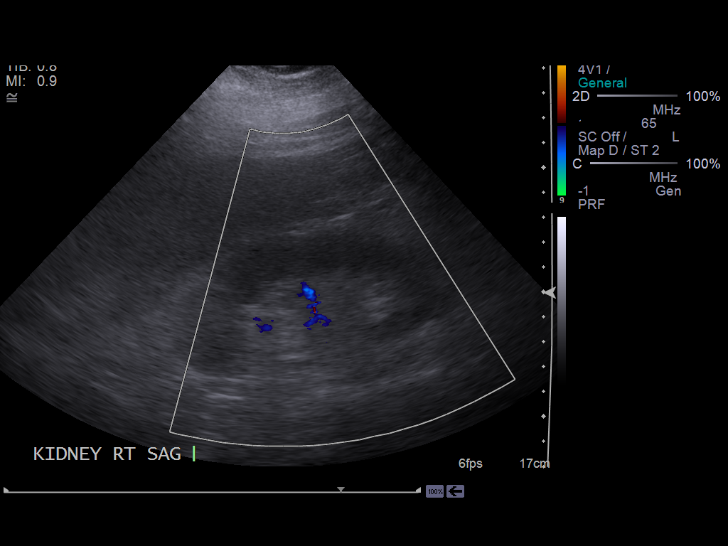
[im 9/50]
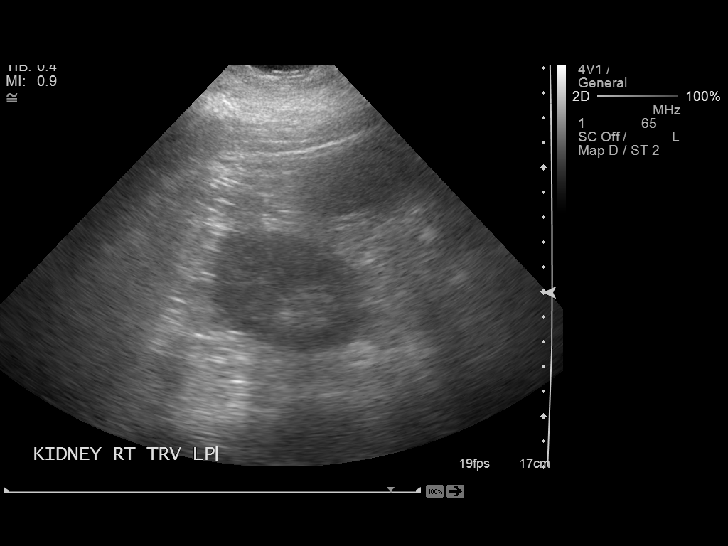
[im 13/50]
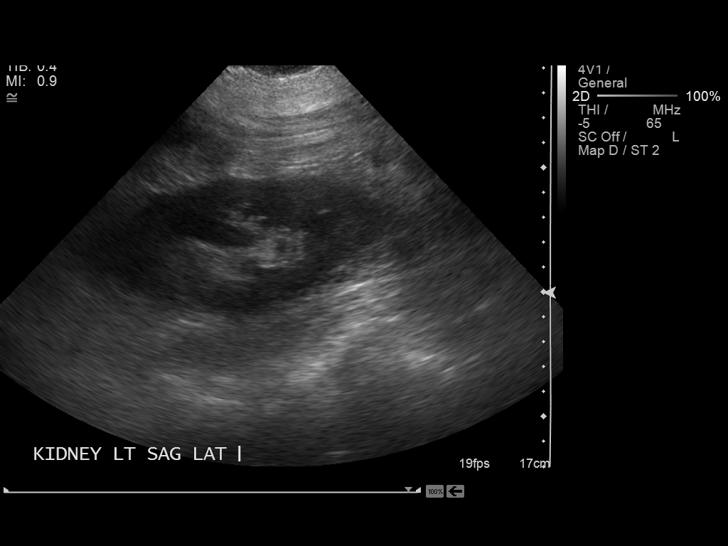
[im 17/50]
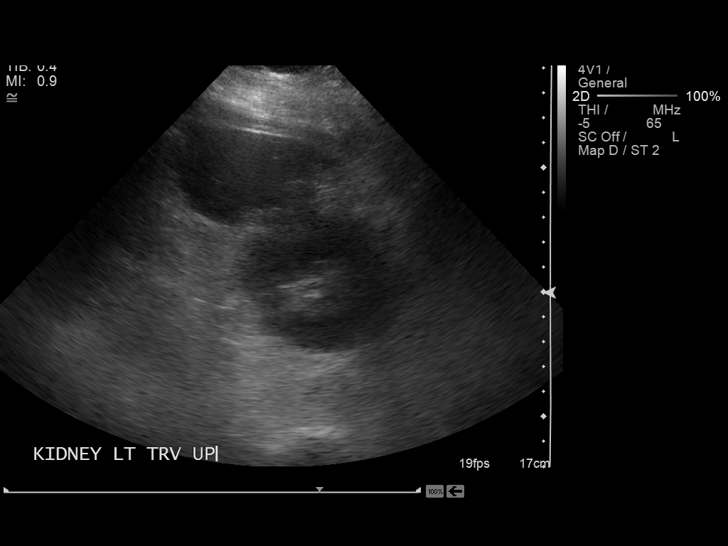
[im 19/50]
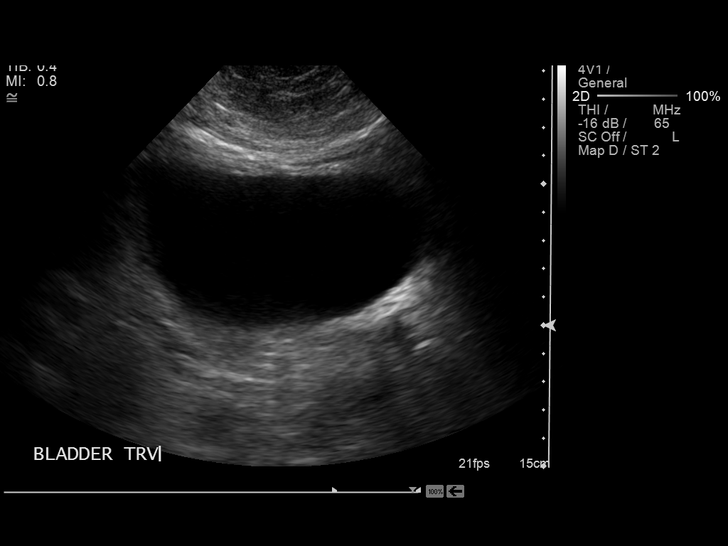
[im 23/50]
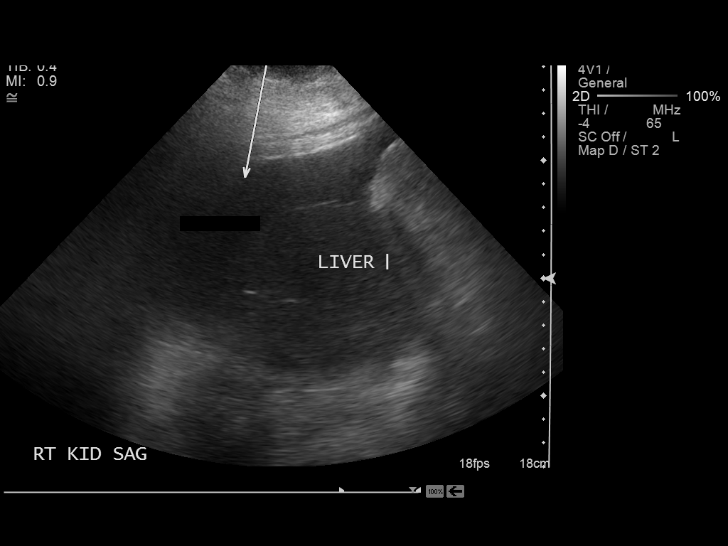
[im 27/50]
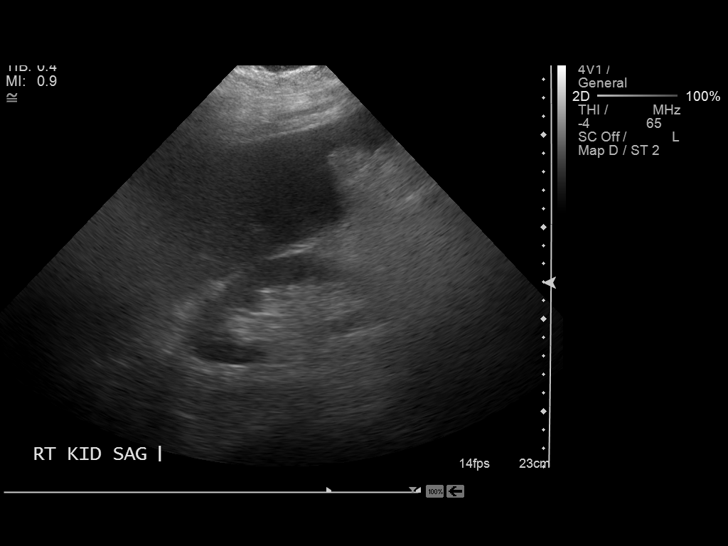
[im 31/50]
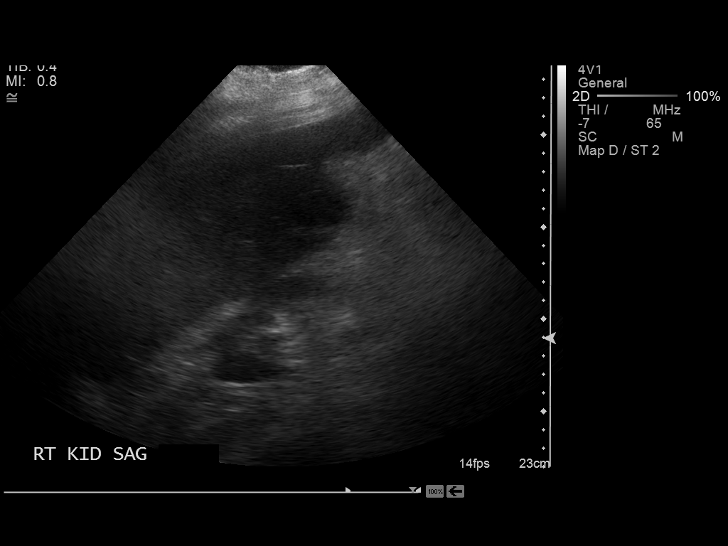
[im 33/50]
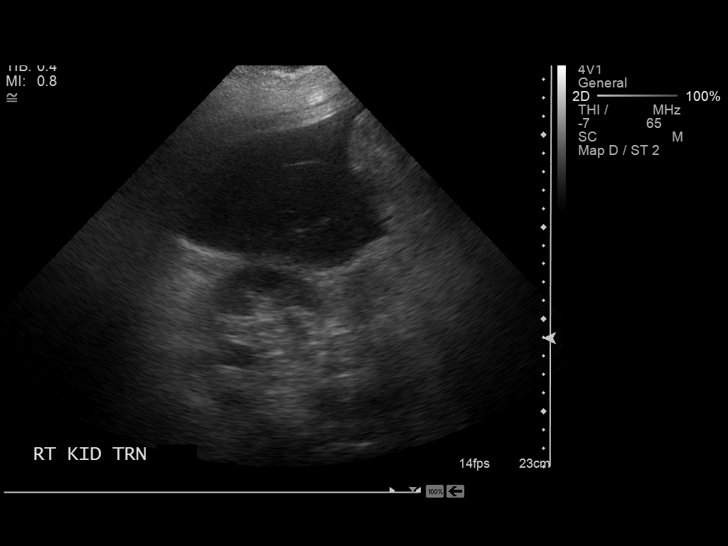
[im 37/50]
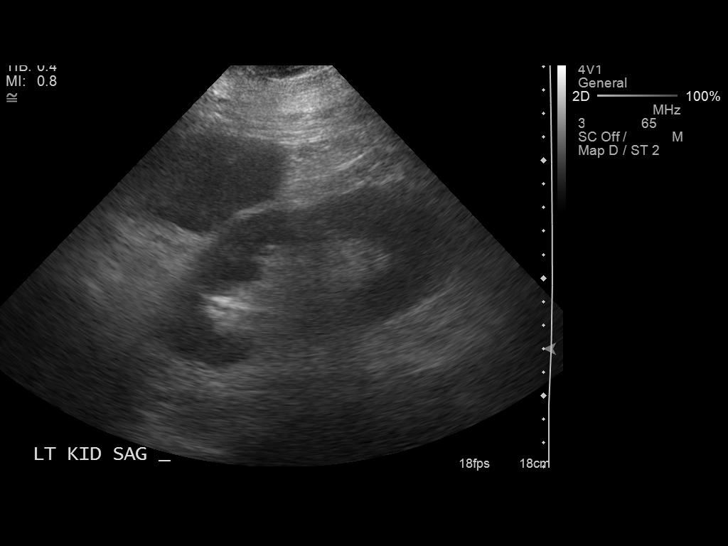
[im 41/50]
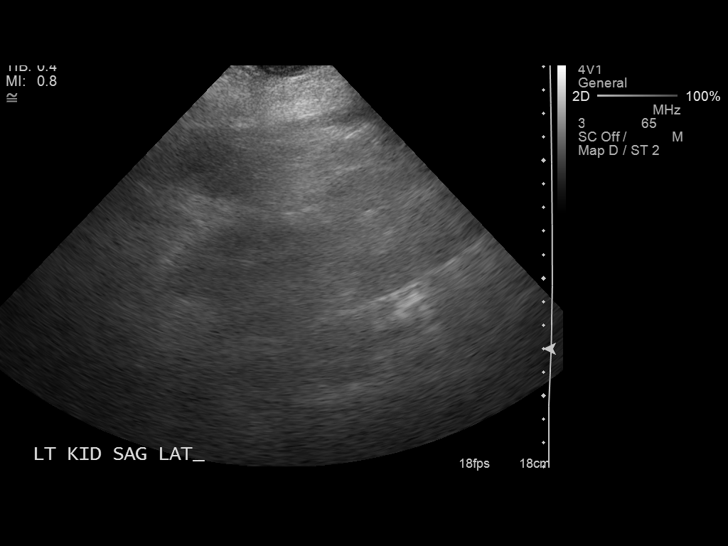
[im 45/50]
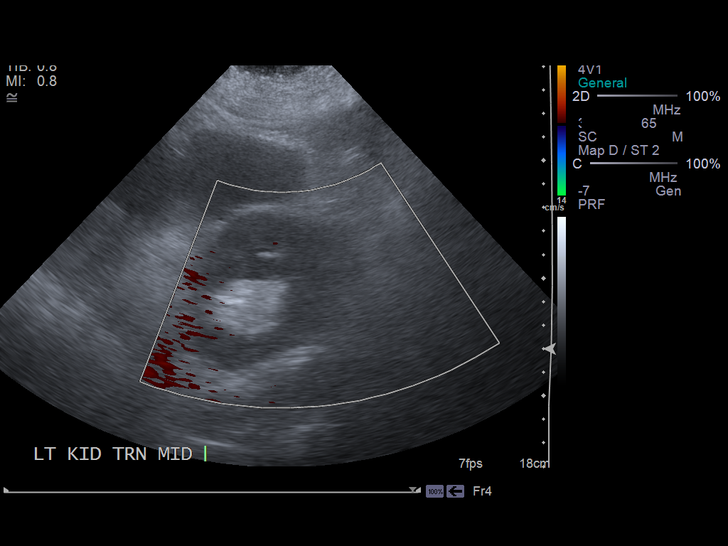
[im 50/50]
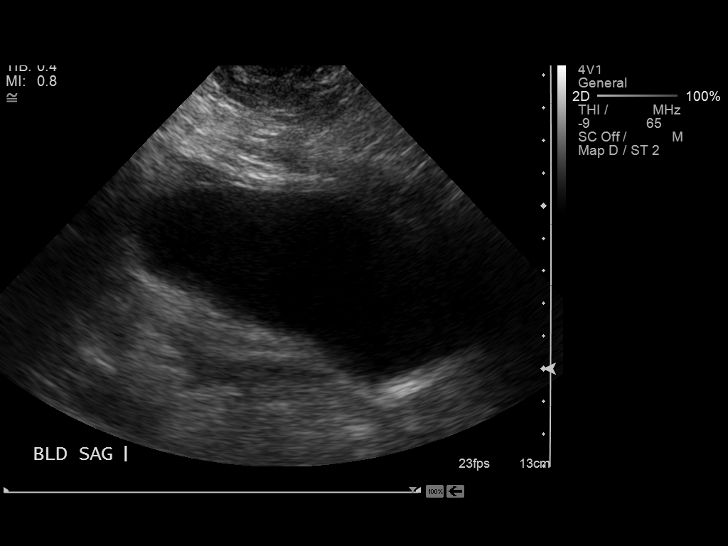

[14 of 25 positions shown; findings below may reference images not displayed]

FINDINGS: Right Kidney:  The right kidney measures 12.2 cm in length without
hydronephrosis.  Renal echogenicity is within normal limits.

Left Kidney:  Left kidney measures 12.9 cm in length without
hydronephrosis.  Normal left renal echogenicity.

Bladder:  Normal appearance of the urinary bladder.

Other findings:  There is perihepatic ascites.
IMPRESSION: Normal appearance of both kidneys without hydronephrosis.

Ascites.

## 2015-01-30 ENCOUNTER — Encounter: Payer: Self-pay | Admitting: Cardiovascular Disease

## 2015-01-30 ENCOUNTER — Encounter: Payer: Self-pay | Admitting: *Deleted

## 2015-01-30 ENCOUNTER — Ambulatory Visit (INDEPENDENT_AMBULATORY_CARE_PROVIDER_SITE_OTHER): Payer: Medicare Other | Admitting: *Deleted

## 2015-01-30 ENCOUNTER — Ambulatory Visit (INDEPENDENT_AMBULATORY_CARE_PROVIDER_SITE_OTHER): Payer: Medicare Other | Admitting: Cardiovascular Disease

## 2015-01-30 VITALS — BP 136/84 | HR 96 | Ht 67.0 in | Wt 278.0 lb

## 2015-01-30 DIAGNOSIS — E785 Hyperlipidemia, unspecified: Secondary | ICD-10-CM | POA: Diagnosis not present

## 2015-01-30 DIAGNOSIS — R5383 Other fatigue: Secondary | ICD-10-CM

## 2015-01-30 DIAGNOSIS — I4891 Unspecified atrial fibrillation: Secondary | ICD-10-CM

## 2015-01-30 DIAGNOSIS — I482 Chronic atrial fibrillation, unspecified: Secondary | ICD-10-CM

## 2015-01-30 DIAGNOSIS — R0602 Shortness of breath: Secondary | ICD-10-CM

## 2015-01-30 DIAGNOSIS — R55 Syncope and collapse: Secondary | ICD-10-CM

## 2015-01-30 DIAGNOSIS — Z7901 Long term (current) use of anticoagulants: Secondary | ICD-10-CM

## 2015-01-30 DIAGNOSIS — I5032 Chronic diastolic (congestive) heart failure: Secondary | ICD-10-CM

## 2015-01-30 DIAGNOSIS — Z5181 Encounter for therapeutic drug level monitoring: Secondary | ICD-10-CM

## 2015-01-30 LAB — POCT INR: INR: 3.7
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
## 2015-01-30 NOTE — Patient Instructions (Signed)
Your physician has requested that you have a lexiscan myoview. For further information please visit [email or web address]. Please follow instruction sheet, as given. Your physician has recommended that you wear a 30 day event monitor. Event monitors are medical devices that record the heart's electrical activity. Doctors most often [place] these monitors to diagnose arrhythmias. Arrhythmias are problems with the speed or rhythm of the heartbeat. The monitor is a small, portable device. You can wear one while you do your normal daily activities. This is usually used to diagnose what is causing palpitations/syncope (passing out). Office will contact with results via phone or letter.   Continue all current medications. Follow up in  2 months.

## 2015-02-07 ENCOUNTER — Other Ambulatory Visit: Payer: Self-pay

## 2015-02-07 ENCOUNTER — Encounter (HOSPITAL_COMMUNITY)
Admission: RE | Admit: 2015-02-07 | Discharge: 2015-02-07 | Disposition: A | Discharge status: Home / Self Care | Payer: Medicare Other | Source: Ambulatory Visit | Attending: Cardiovascular Disease | Admitting: Cardiovascular Disease

## 2015-02-07 ENCOUNTER — Encounter (HOSPITAL_COMMUNITY): Payer: Self-pay

## 2015-02-07 ENCOUNTER — Telehealth: Payer: Self-pay | Admitting: Cardiovascular Disease

## 2015-02-07 ENCOUNTER — Other Ambulatory Visit: Payer: Self-pay | Admitting: Cardiovascular Disease

## 2015-02-07 ENCOUNTER — Encounter: Payer: Self-pay | Admitting: *Deleted

## 2015-02-07 ENCOUNTER — Inpatient Hospital Stay (HOSPITAL_COMMUNITY): Admission: RE | Admit: 2015-02-07 | Payer: Medicare Other | Source: Ambulatory Visit

## 2015-02-07 ENCOUNTER — Telehealth: Payer: Self-pay | Admitting: *Deleted

## 2015-02-07 DIAGNOSIS — R55 Syncope and collapse: Secondary | ICD-10-CM | POA: Diagnosis present

## 2015-02-07 DIAGNOSIS — R0602 Shortness of breath: Secondary | ICD-10-CM | POA: Diagnosis not present

## 2015-02-07 DIAGNOSIS — R5383 Other fatigue: Secondary | ICD-10-CM | POA: Diagnosis present

## 2015-02-07 DIAGNOSIS — R9439 Abnormal result of other cardiovascular function study: Secondary | ICD-10-CM

## 2015-02-07 DIAGNOSIS — R079 Chest pain, unspecified: Secondary | ICD-10-CM

## 2015-02-07 DIAGNOSIS — Z01818 Encounter for other preprocedural examination: Secondary | ICD-10-CM

## 2015-02-07 LAB — NM MYOCAR MULTI W/SPECT W/WALL MOTION / EF
CHL CUP NUCLEAR SRS: 0
CHL CUP NUCLEAR SSS: 0
LV sys vol: 36 mL
LVDIAVOL: 82 mL
NUC STRESS TID: 1.39
Peak HR: 82 {beats}/min
RATE: 0
Rest HR: 70 {beats}/min
SDS: 0

## 2015-02-07 MED ORDER — REGADENOSON 0.4 MG/5ML IV SOLN
INTRAVENOUS | Status: AC
  Administered 2015-02-07: 0.4 mg via INTRAVENOUS
  Filled 2015-02-07: qty 5

## 2015-02-07 MED ORDER — SODIUM CHLORIDE 0.9 % IJ SOLN
INTRAMUSCULAR | Status: AC
  Administered 2015-02-07: 10 mL via INTRAVENOUS
  Filled 2015-02-07: qty 3

## 2015-02-07 MED ORDER — TECHNETIUM TC 99M SESTAMIBI GENERIC - CARDIOLITE
10.0000 | Freq: Once | INTRAVENOUS | Status: AC | PRN
  Administered 2015-02-07: 10 via INTRAVENOUS

## 2015-02-07 MED ORDER — TECHNETIUM TC 99M SESTAMIBI - CARDIOLITE
30.0000 | Freq: Once | INTRAVENOUS | Status: AC | PRN
  Administered 2015-02-07: 10:00:00 32 via INTRAVENOUS

## 2015-02-07 NOTE — Telephone Encounter (Signed)
Returning someones call 

## 2015-02-07 NOTE — Telephone Encounter (Signed)
Per Dr Jayme Cloud should have BMET and INR on Monday 02/10/15.Pt is also to be holding coumadin.I spoke with husband as pt was lying down and he stated he understood instructions.Labs to be drawn at [facility]

## 2015-02-07 NOTE — Telephone Encounter (Signed)
Cath scheduled for 8/31 @ 10AM. Pt aware and will come by office Monday for instructions. Letter printed

## 2015-02-07 NOTE — Telephone Encounter (Signed)
-----   Message from Herminio Commons, MD sent at 02/07/2015  3:12 PM EDT ----- In that case, just tell her there were abnormalities for which I recommend cardiac cath to rule out blockages as cause of passing out. Just let me know who you schedule it with, doesn't matter who it is.  ----- Message -----    From: Massie Maroon, CMA    Sent: 02/07/2015   2:14 PM      To: Herminio Commons, MD  You have no opening for the next 7 days, do you want to see this pt in the 940AM slot or wait another week?  Colleena Kurtenbach

## 2015-02-12 ENCOUNTER — Ambulatory Visit (HOSPITAL_COMMUNITY)
Admission: RE | Admit: 2015-02-12 | Discharge: 2015-02-12 | Disposition: A | Discharge status: Home / Self Care | Payer: Medicare Other | Source: Ambulatory Visit | Attending: Interventional Cardiology | Admitting: Interventional Cardiology

## 2015-02-12 ENCOUNTER — Encounter (HOSPITAL_COMMUNITY)
Admission: RE | Disposition: A | Discharge status: Home / Self Care | Payer: Self-pay | Source: Ambulatory Visit | Attending: Interventional Cardiology

## 2015-02-12 ENCOUNTER — Other Ambulatory Visit: Payer: Self-pay

## 2015-02-12 DIAGNOSIS — I482 Chronic atrial fibrillation: Secondary | ICD-10-CM | POA: Insufficient documentation

## 2015-02-12 DIAGNOSIS — R931 Abnormal findings on diagnostic imaging of heart and coronary circulation: Secondary | ICD-10-CM

## 2015-02-12 DIAGNOSIS — Z79899 Other long term (current) drug therapy: Secondary | ICD-10-CM | POA: Diagnosis not present

## 2015-02-12 DIAGNOSIS — Z7901 Long term (current) use of anticoagulants: Secondary | ICD-10-CM | POA: Insufficient documentation

## 2015-02-12 DIAGNOSIS — Z794 Long term (current) use of insulin: Secondary | ICD-10-CM | POA: Insufficient documentation

## 2015-02-12 DIAGNOSIS — E785 Hyperlipidemia, unspecified: Secondary | ICD-10-CM | POA: Insufficient documentation

## 2015-02-12 DIAGNOSIS — Z79891 Long term (current) use of opiate analgesic: Secondary | ICD-10-CM | POA: Diagnosis not present

## 2015-02-12 DIAGNOSIS — I10 Essential (primary) hypertension: Secondary | ICD-10-CM | POA: Insufficient documentation

## 2015-02-12 DIAGNOSIS — F329 Major depressive disorder, single episode, unspecified: Secondary | ICD-10-CM | POA: Diagnosis not present

## 2015-02-12 DIAGNOSIS — R9439 Abnormal result of other cardiovascular function study: Secondary | ICD-10-CM | POA: Diagnosis not present

## 2015-02-12 DIAGNOSIS — E119 Type 2 diabetes mellitus without complications: Secondary | ICD-10-CM | POA: Diagnosis not present

## 2015-02-12 DIAGNOSIS — I340 Nonrheumatic mitral (valve) insufficiency: Secondary | ICD-10-CM | POA: Diagnosis not present

## 2015-02-12 DIAGNOSIS — I5032 Chronic diastolic (congestive) heart failure: Secondary | ICD-10-CM | POA: Diagnosis not present

## 2015-02-12 DIAGNOSIS — R0602 Shortness of breath: Secondary | ICD-10-CM | POA: Insufficient documentation

## 2015-02-12 DIAGNOSIS — Z8542 Personal history of malignant neoplasm of other parts of uterus: Secondary | ICD-10-CM | POA: Insufficient documentation

## 2015-02-12 DIAGNOSIS — R079 Chest pain, unspecified: Secondary | ICD-10-CM

## 2015-02-12 DIAGNOSIS — Z6841 Body Mass Index (BMI) 40.0 and over, adult: Secondary | ICD-10-CM | POA: Insufficient documentation

## 2015-02-12 HISTORY — PX: CARDIAC CATHETERIZATION: SHX172

## 2015-02-12 LAB — BASIC METABOLIC PANEL
ANION GAP: 10 (ref 5–15)
BUN: 20 mg/dL (ref 6–20)
CALCIUM: 9.2 mg/dL (ref 8.9–10.3)
CO2: 25 mmol/L (ref 22–32)
CREATININE: 1.71 mg/dL — AB (ref 0.44–1.00)
Chloride: 103 mmol/L (ref 101–111)
GFR, EST AFRICAN AMERICAN: 34 mL/min — AB (ref >60)
GFR, EST NON AFRICAN AMERICAN: 30 mL/min — AB (ref >60)
GLUCOSE: 117 mg/dL — AB (ref 65–99)
Potassium: 4.2 mmol/L (ref 3.5–5.1)
Sodium: 138 mmol/L (ref 135–145)

## 2015-02-12 LAB — CBC
HCT: 34.5 % — ABNORMAL LOW (ref 36.0–46.0)
HEMOGLOBIN: 10.6 g/dL — AB (ref 12.0–15.0)
MCH: 27.7 pg (ref 26.0–34.0)
MCHC: 30.7 g/dL (ref 30.0–36.0)
MCV: 90.3 fL (ref 78.0–100.0)
PLATELETS: 169 10*3/uL (ref 150–400)
RBC: 3.82 MIL/uL — ABNORMAL LOW (ref 3.87–5.11)
RDW: 17.6 % — ABNORMAL HIGH (ref 11.5–15.5)
WBC: 6.4 10*3/uL (ref 4.0–10.5)

## 2015-02-12 LAB — PROTIME-INR
INR: 2.13 — ABNORMAL HIGH (ref 0.00–1.49)
PROTHROMBIN TIME: 23.7 s — AB (ref 11.6–15.2)

## 2015-02-12 LAB — GLUCOSE, CAPILLARY
GLUCOSE-CAPILLARY: 109 mg/dL — AB (ref 65–99)
GLUCOSE-CAPILLARY: 79 mg/dL (ref 65–99)

## 2015-02-12 SURGERY — LEFT HEART CATH AND CORONARY ANGIOGRAPHY
Anesthesia: LOCAL

## 2015-02-12 MED ORDER — SODIUM CHLORIDE 0.9 % IJ SOLN: 3.0000 mL | Freq: Two times a day (BID) | INTRAMUSCULAR | Status: DC

## 2015-02-12 MED ORDER — SODIUM CHLORIDE 0.9 % IV SOLN: INTRAVENOUS | Status: DC

## 2015-02-12 MED ORDER — SODIUM CHLORIDE 0.9 % IJ SOLN: 3.0000 mL | INTRAMUSCULAR | Status: DC | PRN

## 2015-02-12 MED ORDER — SODIUM CHLORIDE 0.9 % IV SOLN: 250.0000 mL | INTRAVENOUS | Status: DC | PRN

## 2015-02-12 MED ORDER — ASPIRIN 81 MG PO CHEW
81.0000 mg | CHEWABLE_TABLET | ORAL | Status: AC
  Administered 2015-02-12: 81 mg via ORAL

## 2015-02-12 MED ORDER — HEPARIN (PORCINE) IN NACL 2-0.9 UNIT/ML-% IJ SOLN
INTRAMUSCULAR | Status: AC
  Filled 2015-02-12: qty 1500

## 2015-02-12 MED ORDER — LIDOCAINE HCL (PF) 1 % IJ SOLN
INTRAMUSCULAR | Status: DC | PRN
  Administered 2015-02-12: 14:00:00

## 2015-02-12 MED ORDER — ASPIRIN 81 MG PO CHEW
CHEWABLE_TABLET | ORAL | Status: AC
  Administered 2015-02-12: 81 mg via ORAL
  Filled 2015-02-12: qty 1

## 2015-02-12 MED ORDER — LIDOCAINE HCL (PF) 1 % IJ SOLN
INTRAMUSCULAR | Status: AC
Start: 2015-02-12 — End: 2015-02-12
  Filled 2015-02-12: qty 30

## 2015-02-12 MED ORDER — SODIUM CHLORIDE 0.9 % WEIGHT BASED INFUSION: 1.0000 mL/kg/h | INTRAVENOUS | Status: DC

## 2015-02-12 MED ORDER — HEPARIN SODIUM (PORCINE) 1000 UNIT/ML IJ SOLN
INTRAMUSCULAR | Status: AC
  Filled 2015-02-12: qty 1

## 2015-02-12 MED ORDER — VERAPAMIL HCL 2.5 MG/ML IV SOLN
INTRAVENOUS | Status: DC | PRN
  Administered 2015-02-12: 14:00:00 via INTRA_ARTERIAL

## 2015-02-12 MED ORDER — HEPARIN SODIUM (PORCINE) 1000 UNIT/ML IJ SOLN
INTRAMUSCULAR | Status: DC | PRN
  Administered 2015-02-12: 6000 [IU] via INTRAVENOUS

## 2015-02-12 MED ORDER — MIDAZOLAM HCL 2 MG/2ML IJ SOLN
INTRAMUSCULAR | Status: DC | PRN
  Administered 2015-02-12: 2 mg via INTRAVENOUS

## 2015-02-12 MED ORDER — SODIUM CHLORIDE 0.9 % IV BOLUS (SEPSIS)
500.0000 mL | Freq: Once | INTRAVENOUS | Status: AC
  Administered 2015-02-12: 500 mL via INTRAVENOUS

## 2015-02-12 MED ORDER — MIDAZOLAM HCL 2 MG/2ML IJ SOLN
INTRAMUSCULAR | Status: AC
  Filled 2015-02-12: qty 4

## 2015-02-12 MED ORDER — FENTANYL CITRATE (PF) 100 MCG/2ML IJ SOLN
INTRAMUSCULAR | Status: DC | PRN
  Administered 2015-02-12: 25 ug via INTRAVENOUS

## 2015-02-12 MED ORDER — SODIUM CHLORIDE 0.9 % IV SOLN
INTRAVENOUS | Status: DC
  Administered 2015-02-12: 1000 mL via INTRAVENOUS

## 2015-02-12 MED ORDER — FENTANYL CITRATE (PF) 100 MCG/2ML IJ SOLN
INTRAMUSCULAR | Status: AC
Start: 2015-02-12 — End: 2015-02-12
  Filled 2015-02-12: qty 4

## 2015-02-12 MED ORDER — IOHEXOL 350 MG/ML SOLN
INTRAVENOUS | Status: DC | PRN
  Administered 2015-02-12: 30 mL via INTRA_ARTERIAL

## 2015-02-12 MED ORDER — VERAPAMIL HCL 2.5 MG/ML IV SOLN
INTRAVENOUS | Status: AC
Start: 2015-02-12 — End: 2015-02-12
  Filled 2015-02-12: qty 2

## 2015-02-12 SURGICAL SUPPLY — 11 items
CATH INFINITI 5 FR JL3.5 (CATHETERS) ×2 IMPLANT
CATH INFINITI JR4 5F (CATHETERS) ×2 IMPLANT
DEVICE RAD COMP TR BAND LRG (VASCULAR PRODUCTS) ×2 IMPLANT
GLIDESHEATH SLEND SS 6F .021 (SHEATH) ×2 IMPLANT
KIT HEART LEFT (KITS) ×2 IMPLANT
PACK CARDIAC CATHETERIZATION (CUSTOM PROCEDURE TRAY) ×2 IMPLANT
SYR MEDRAD MARK V 150ML (SYRINGE) ×2 IMPLANT
TRANSDUCER W/STOPCOCK (MISCELLANEOUS) ×2 IMPLANT
TUBING CIL FLEX 10 FLL-RA (TUBING) ×2 IMPLANT
WIRE HI TORQ VERSACORE-J 145CM (WIRE) ×1 IMPLANT
WIRE SAFE-T 1.5MM-J .035X260CM (WIRE) ×2 IMPLANT
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
## 2015-02-12 NOTE — Interval H&P Note (Signed)
History and Physical Interval Note:  02/12/2015 2:42 PM  Krystal [name]  has presented today for surgery, with the diagnosis of abnormal stress test  The various methods of treatment have been discussed with the patient and family. After consideration of risks, benefits and other options for treatment, the patient has consented to  Procedure(s): Left Heart Cath and Coronary Angiography (N/A) as a surgical intervention .  The patient's history has been reviewed, patient examined, no change in status, stable for surgery.  I have reviewed the patient's chart and labs.  Questions were answered to the patient's satisfaction.     Doryan Bahl S.

## 2015-02-12 NOTE — Interval H&P Note (Signed)
Cath Lab Visit (complete for each Cath Lab visit)  Clinical Evaluation Leading to the Procedure:   ACS: No.  Non-ACS:    Anginal Classification: CCS II  Anti-ischemic medical therapy: Minimal Therapy (1 class of medications)  Non-Invasive Test Results: High-risk stress test findings: cardiac mortality >3%/year  Prior CABG: No previous CABG      History and Physical Interval Note:  02/12/2015 2:41 PM  Krystal [name]  has presented today for surgery, with the diagnosis of abnormal stress test  The various methods of treatment have been discussed with the patient and family. After consideration of risks, benefits and other options for treatment, the patient has consented to  Procedure(s): Left Heart Cath and Coronary Angiography (N/A) as a surgical intervention .  The patient's history has been reviewed, patient examined, no change in status, stable for surgery.  I have reviewed the patient's chart and labs.  Questions were answered to the patient's satisfaction.     Sumiko Ceasar S.

## 2015-02-12 NOTE — Discharge Instructions (Signed)
Radial Site Care °Refer to this sheet in the next few weeks. These instructions provide you with information on caring for yourself after your procedure. Your caregiver may also give you more specific instructions. Your treatment has been planned according to current medical practices, but problems sometimes occur. Call your caregiver if you have any problems or questions after your procedure. °HOME CARE INSTRUCTIONS °· You may shower the day after the procedure. Remove the bandage (dressing) and gently wash the site with plain soap and water. Gently pat the site dry. °· Do not apply powder or lotion to the site. °· Do not submerge the affected site in water for 3 to 5 days. °· Inspect the site at least twice daily. °· Do not flex or bend the affected arm for 24 hours. °· No lifting over 5 pounds (2.3 kg) for 5 days after your procedure. °· Do not drive home if you are discharged the same day of the procedure. Have someone else drive you. °· You may drive 24 hours after the procedure unless otherwise instructed by your caregiver. °· Do not operate machinery or power tools for 24 hours. °· A responsible adult should be with you for the first 24 hours after you arrive home. °What to expect: °· Any bruising will usually fade within 1 to 2 weeks. °· Blood that collects in the tissue (hematoma) may be painful to the touch. It should usually decrease in size and tenderness within 1 to 2 weeks. °SEEK IMMEDIATE MEDICAL CARE IF: °· You have unusual pain at the radial site. °· You have redness, warmth, swelling, or pain at the radial site. °· You have drainage (other than a small amount of blood on the dressing). °· You have chills. °· You have a fever or persistent symptoms for more than 72 hours. °· You have a fever and your symptoms suddenly get worse. °· Your arm becomes pale, cool, tingly, or numb. °· You have heavy bleeding from the site. Hold pressure on the site. °Document Released: 07/03/2010 Document Revised:  08/23/2011 Document Reviewed: 07/03/2010 °ExitCare® Patient Information ©2015 [employer]. This information is not intended to replace advice given to you by your health care provider. Make sure you discuss any questions you have with your health care provider. ° °

## 2015-02-13 ENCOUNTER — Encounter (HOSPITAL_COMMUNITY): Payer: Self-pay | Admitting: Interventional Cardiology

## 2015-02-20 ENCOUNTER — Ambulatory Visit (INDEPENDENT_AMBULATORY_CARE_PROVIDER_SITE_OTHER): Payer: Medicare Other | Admitting: *Deleted

## 2015-02-20 DIAGNOSIS — Z7901 Long term (current) use of anticoagulants: Secondary | ICD-10-CM

## 2015-02-20 DIAGNOSIS — Z5181 Encounter for therapeutic drug level monitoring: Secondary | ICD-10-CM

## 2015-02-20 DIAGNOSIS — I4891 Unspecified atrial fibrillation: Secondary | ICD-10-CM

## 2015-02-20 LAB — POCT INR: INR: 1.7

## 2015-02-27 ENCOUNTER — Ambulatory Visit (INDEPENDENT_AMBULATORY_CARE_PROVIDER_SITE_OTHER): Payer: Medicare Other | Admitting: *Deleted

## 2015-02-27 DIAGNOSIS — Z7901 Long term (current) use of anticoagulants: Secondary | ICD-10-CM | POA: Diagnosis not present

## 2015-02-27 DIAGNOSIS — Z5181 Encounter for therapeutic drug level monitoring: Secondary | ICD-10-CM | POA: Diagnosis not present

## 2015-02-27 DIAGNOSIS — I4891 Unspecified atrial fibrillation: Secondary | ICD-10-CM

## 2015-02-27 DIAGNOSIS — I480 Paroxysmal atrial fibrillation: Secondary | ICD-10-CM

## 2015-02-27 LAB — POCT INR: INR: 2.4

## 2015-03-24 ENCOUNTER — Encounter (HOSPITAL_COMMUNITY): Payer: Self-pay | Admitting: *Deleted

## 2015-03-24 ENCOUNTER — Emergency Department (HOSPITAL_COMMUNITY): Payer: Medicare Other

## 2015-03-24 ENCOUNTER — Inpatient Hospital Stay (HOSPITAL_COMMUNITY)
Admission: EM | Admit: 2015-03-24 | Discharge: 2015-04-15 | DRG: 308 | Disposition: E | Payer: Medicare Other | Attending: Pulmonary Disease | Admitting: Pulmonary Disease

## 2015-03-24 DIAGNOSIS — E872 Acidosis: Secondary | ICD-10-CM | POA: Diagnosis present

## 2015-03-24 DIAGNOSIS — R310 Gross hematuria: Secondary | ICD-10-CM | POA: Diagnosis present

## 2015-03-24 DIAGNOSIS — I130 Hypertensive heart and chronic kidney disease with heart failure and stage 1 through stage 4 chronic kidney disease, or unspecified chronic kidney disease: Secondary | ICD-10-CM | POA: Diagnosis present

## 2015-03-24 DIAGNOSIS — Z7901 Long term (current) use of anticoagulants: Secondary | ICD-10-CM | POA: Diagnosis not present

## 2015-03-24 DIAGNOSIS — R188 Other ascites: Secondary | ICD-10-CM | POA: Diagnosis present

## 2015-03-24 DIAGNOSIS — E1121 Type 2 diabetes mellitus with diabetic nephropathy: Secondary | ICD-10-CM | POA: Diagnosis present

## 2015-03-24 DIAGNOSIS — Z85828 Personal history of other malignant neoplasm of skin: Secondary | ICD-10-CM

## 2015-03-24 DIAGNOSIS — J9601 Acute respiratory failure with hypoxia: Secondary | ICD-10-CM | POA: Diagnosis present

## 2015-03-24 DIAGNOSIS — I482 Chronic atrial fibrillation: Principal | ICD-10-CM | POA: Diagnosis present

## 2015-03-24 DIAGNOSIS — I509 Heart failure, unspecified: Secondary | ICD-10-CM | POA: Diagnosis not present

## 2015-03-24 DIAGNOSIS — Z9001 Acquired absence of eye: Secondary | ICD-10-CM | POA: Diagnosis not present

## 2015-03-24 DIAGNOSIS — R791 Abnormal coagulation profile: Secondary | ICD-10-CM | POA: Diagnosis present

## 2015-03-24 DIAGNOSIS — H409 Unspecified glaucoma: Secondary | ICD-10-CM | POA: Diagnosis present

## 2015-03-24 DIAGNOSIS — I4510 Unspecified right bundle-branch block: Secondary | ICD-10-CM | POA: Diagnosis present

## 2015-03-24 DIAGNOSIS — J9690 Respiratory failure, unspecified, unspecified whether with hypoxia or hypercapnia: Secondary | ICD-10-CM

## 2015-03-24 DIAGNOSIS — E662 Morbid (severe) obesity with alveolar hypoventilation: Secondary | ICD-10-CM | POA: Diagnosis present

## 2015-03-24 DIAGNOSIS — N184 Chronic kidney disease, stage 4 (severe): Secondary | ICD-10-CM | POA: Diagnosis present

## 2015-03-24 DIAGNOSIS — Z66 Do not resuscitate: Secondary | ICD-10-CM | POA: Diagnosis present

## 2015-03-24 DIAGNOSIS — Z8542 Personal history of malignant neoplasm of other parts of uterus: Secondary | ICD-10-CM

## 2015-03-24 DIAGNOSIS — Z6841 Body Mass Index (BMI) 40.0 and over, adult: Secondary | ICD-10-CM | POA: Diagnosis not present

## 2015-03-24 DIAGNOSIS — Z79899 Other long term (current) drug therapy: Secondary | ICD-10-CM | POA: Diagnosis not present

## 2015-03-24 DIAGNOSIS — Z9111 Patient's noncompliance with dietary regimen: Secondary | ICD-10-CM | POA: Diagnosis not present

## 2015-03-24 DIAGNOSIS — Z886 Allergy status to analgesic agent status: Secondary | ICD-10-CM

## 2015-03-24 DIAGNOSIS — N170 Acute kidney failure with tubular necrosis: Secondary | ICD-10-CM | POA: Diagnosis not present

## 2015-03-24 DIAGNOSIS — I272 Other secondary pulmonary hypertension: Secondary | ICD-10-CM | POA: Diagnosis present

## 2015-03-24 DIAGNOSIS — R0682 Tachypnea, not elsewhere classified: Secondary | ICD-10-CM | POA: Diagnosis not present

## 2015-03-24 DIAGNOSIS — J189 Pneumonia, unspecified organism: Secondary | ICD-10-CM | POA: Diagnosis not present

## 2015-03-24 DIAGNOSIS — T501X5A Adverse effect of loop [high-ceiling] diuretics, initial encounter: Secondary | ICD-10-CM | POA: Diagnosis not present

## 2015-03-24 DIAGNOSIS — A419 Sepsis, unspecified organism: Secondary | ICD-10-CM | POA: Diagnosis not present

## 2015-03-24 DIAGNOSIS — Z9289 Personal history of other medical treatment: Secondary | ICD-10-CM

## 2015-03-24 DIAGNOSIS — I5033 Acute on chronic diastolic (congestive) heart failure: Secondary | ICD-10-CM | POA: Diagnosis present

## 2015-03-24 DIAGNOSIS — I071 Rheumatic tricuspid insufficiency: Secondary | ICD-10-CM | POA: Diagnosis present

## 2015-03-24 DIAGNOSIS — Y95 Nosocomial condition: Secondary | ICD-10-CM | POA: Diagnosis not present

## 2015-03-24 DIAGNOSIS — R6521 Severe sepsis with septic shock: Secondary | ICD-10-CM | POA: Diagnosis not present

## 2015-03-24 DIAGNOSIS — Z794 Long term (current) use of insulin: Secondary | ICD-10-CM

## 2015-03-24 DIAGNOSIS — T45515A Adverse effect of anticoagulants, initial encounter: Secondary | ICD-10-CM | POA: Diagnosis present

## 2015-03-24 DIAGNOSIS — D638 Anemia in other chronic diseases classified elsewhere: Secondary | ICD-10-CM | POA: Diagnosis present

## 2015-03-24 DIAGNOSIS — K7581 Nonalcoholic steatohepatitis (NASH): Secondary | ICD-10-CM | POA: Diagnosis present

## 2015-03-24 DIAGNOSIS — Z888 Allergy status to other drugs, medicaments and biological substances status: Secondary | ICD-10-CM

## 2015-03-24 DIAGNOSIS — J80 Acute respiratory distress syndrome: Secondary | ICD-10-CM | POA: Diagnosis present

## 2015-03-24 DIAGNOSIS — D62 Acute posthemorrhagic anemia: Secondary | ICD-10-CM | POA: Diagnosis present

## 2015-03-24 DIAGNOSIS — Z515 Encounter for palliative care: Secondary | ICD-10-CM | POA: Diagnosis present

## 2015-03-24 DIAGNOSIS — J9801 Acute bronchospasm: Secondary | ICD-10-CM | POA: Diagnosis not present

## 2015-03-24 DIAGNOSIS — E669 Obesity, unspecified: Secondary | ICD-10-CM | POA: Diagnosis present

## 2015-03-24 DIAGNOSIS — M1990 Unspecified osteoarthritis, unspecified site: Secondary | ICD-10-CM | POA: Diagnosis present

## 2015-03-24 DIAGNOSIS — J90 Pleural effusion, not elsewhere classified: Secondary | ICD-10-CM

## 2015-03-24 DIAGNOSIS — E1165 Type 2 diabetes mellitus with hyperglycemia: Secondary | ICD-10-CM | POA: Diagnosis present

## 2015-03-24 DIAGNOSIS — I4891 Unspecified atrial fibrillation: Secondary | ICD-10-CM | POA: Diagnosis present

## 2015-03-24 DIAGNOSIS — E440 Moderate protein-calorie malnutrition: Secondary | ICD-10-CM | POA: Diagnosis present

## 2015-03-24 DIAGNOSIS — Z9114 Patient's other noncompliance with medication regimen: Secondary | ICD-10-CM | POA: Diagnosis not present

## 2015-03-24 DIAGNOSIS — Z90710 Acquired absence of both cervix and uterus: Secondary | ICD-10-CM

## 2015-03-24 DIAGNOSIS — K760 Fatty (change of) liver, not elsewhere classified: Secondary | ICD-10-CM | POA: Diagnosis present

## 2015-03-24 DIAGNOSIS — I129 Hypertensive chronic kidney disease with stage 1 through stage 4 chronic kidney disease, or unspecified chronic kidney disease: Secondary | ICD-10-CM | POA: Diagnosis present

## 2015-03-24 DIAGNOSIS — E1122 Type 2 diabetes mellitus with diabetic chronic kidney disease: Secondary | ICD-10-CM | POA: Diagnosis present

## 2015-03-24 DIAGNOSIS — R0602 Shortness of breath: Secondary | ICD-10-CM | POA: Diagnosis present

## 2015-03-24 DIAGNOSIS — F329 Major depressive disorder, single episode, unspecified: Secondary | ICD-10-CM | POA: Diagnosis present

## 2015-03-24 DIAGNOSIS — Z833 Family history of diabetes mellitus: Secondary | ICD-10-CM

## 2015-03-24 DIAGNOSIS — E871 Hypo-osmolality and hyponatremia: Secondary | ICD-10-CM | POA: Diagnosis present

## 2015-03-24 DIAGNOSIS — I10 Essential (primary) hypertension: Secondary | ICD-10-CM | POA: Diagnosis present

## 2015-03-24 DIAGNOSIS — D72829 Elevated white blood cell count, unspecified: Secondary | ICD-10-CM

## 2015-03-24 DIAGNOSIS — Z8249 Family history of ischemic heart disease and other diseases of the circulatory system: Secondary | ICD-10-CM | POA: Diagnosis not present

## 2015-03-24 DIAGNOSIS — N390 Urinary tract infection, site not specified: Secondary | ICD-10-CM | POA: Diagnosis present

## 2015-03-24 DIAGNOSIS — Z01818 Encounter for other preprocedural examination: Secondary | ICD-10-CM

## 2015-03-24 DIAGNOSIS — R109 Unspecified abdominal pain: Secondary | ICD-10-CM

## 2015-03-24 HISTORY — DX: Chronic diastolic (congestive) heart failure: I50.32

## 2015-03-24 HISTORY — DX: Type 2 diabetes mellitus without complications: E11.9

## 2015-03-24 HISTORY — DX: Essential (primary) hypertension: I10

## 2015-03-24 HISTORY — DX: Chronic atrial fibrillation, unspecified: I48.20

## 2015-03-24 LAB — PROTIME-INR
INR: 9.81 — AB (ref 0.00–1.49)
Prothrombin Time: 74.6 seconds — ABNORMAL HIGH (ref 11.6–15.2)

## 2015-03-24 LAB — CBC WITH DIFFERENTIAL/PLATELET
BASOS PCT: 0 %
Basophils Absolute: 0 10*3/uL (ref 0.0–0.1)
EOS ABS: 0 10*3/uL (ref 0.0–0.7)
Eosinophils Relative: 0 %
HCT: 36 % (ref 36.0–46.0)
Hemoglobin: 11.8 g/dL — ABNORMAL LOW (ref 12.0–15.0)
LYMPHS ABS: 0.5 10*3/uL — AB (ref 0.7–4.0)
Lymphocytes Relative: 3 %
MCH: 28.1 pg (ref 26.0–34.0)
MCHC: 32.8 g/dL (ref 30.0–36.0)
MCV: 85.7 fL (ref 78.0–100.0)
MONO ABS: 1.6 10*3/uL — AB (ref 0.1–1.0)
MONOS PCT: 10 %
Neutro Abs: 13.5 10*3/uL — ABNORMAL HIGH (ref 1.7–7.7)
Neutrophils Relative %: 87 %
Platelets: 281 10*3/uL (ref 150–400)
RBC: 4.2 MIL/uL (ref 3.87–5.11)
RDW: 16.8 % — AB (ref 11.5–15.5)
WBC: 15.7 10*3/uL — ABNORMAL HIGH (ref 4.0–10.5)

## 2015-03-24 LAB — LIPASE, BLOOD: LIPASE: 22 U/L (ref 22–51)

## 2015-03-24 LAB — COMPREHENSIVE METABOLIC PANEL
ALBUMIN: 3.1 g/dL — AB (ref 3.5–5.0)
ALK PHOS: 143 U/L — AB (ref 38–126)
ALT: 6 U/L — ABNORMAL LOW (ref 14–54)
ANION GAP: 14 (ref 5–15)
AST: 25 U/L (ref 15–41)
BILIRUBIN TOTAL: 5.5 mg/dL — AB (ref 0.3–1.2)
BUN: 28 mg/dL — ABNORMAL HIGH (ref 6–20)
CALCIUM: 8.7 mg/dL — AB (ref 8.9–10.3)
CO2: 23 mmol/L (ref 22–32)
Chloride: 94 mmol/L — ABNORMAL LOW (ref 101–111)
Creatinine, Ser: 1.59 mg/dL — ABNORMAL HIGH (ref 0.44–1.00)
GFR calc non Af Amer: 32 mL/min — ABNORMAL LOW (ref >60)
GFR, EST AFRICAN AMERICAN: 37 mL/min — AB (ref >60)
GLUCOSE: 131 mg/dL — AB (ref 65–99)
POTASSIUM: 3.8 mmol/L (ref 3.5–5.1)
SODIUM: 131 mmol/L — AB (ref 135–145)
TOTAL PROTEIN: 7.8 g/dL (ref 6.5–8.1)

## 2015-03-24 LAB — AMMONIA: AMMONIA: 18 umol/L (ref 9–35)

## 2015-03-24 LAB — LACTIC ACID, PLASMA
LACTIC ACID, VENOUS: 2.3 mmol/L — AB (ref 0.5–2.0)
LACTIC ACID, VENOUS: 1.6 mmol/L (ref 0.5–2.0)

## 2015-03-24 LAB — BRAIN NATRIURETIC PEPTIDE: B Natriuretic Peptide: 422 pg/mL — ABNORMAL HIGH (ref 0.0–100.0)

## 2015-03-24 LAB — TROPONIN I: Troponin I: <0.03 ng/mL (ref <0.031)

## 2015-03-24 MED ORDER — INFLUENZA VAC SPLIT QUAD 0.5 ML IM SUSY
0.5000 mL | PREFILLED_SYRINGE | INTRAMUSCULAR | Status: DC
  Filled 2015-03-24: qty 0.5

## 2015-03-24 MED ORDER — DILTIAZEM LOAD VIA INFUSION
10.0000 mg | Freq: Once | INTRAVENOUS | Status: AC
  Administered 2015-03-24: 10 mg via INTRAVENOUS
  Filled 2015-03-24: qty 10

## 2015-03-24 MED ORDER — SODIUM CHLORIDE 0.9 % IJ SOLN
3.0000 mL | Freq: Two times a day (BID) | INTRAMUSCULAR | Status: DC
  Administered 2015-03-25 – 2015-04-05 (×20): 3 mL via INTRAVENOUS

## 2015-03-24 MED ORDER — CITALOPRAM HYDROBROMIDE 20 MG PO TABS
20.0000 mg | ORAL_TABLET | Freq: Every day | ORAL | Status: DC
  Administered 2015-03-25 – 2015-04-09 (×16): 20 mg via ORAL
  Filled 2015-03-24 (×18): qty 1

## 2015-03-24 MED ORDER — DEXTROSE 5 % IV SOLN
2.0000 mg | Freq: Once | INTRAVENOUS | Status: AC
  Administered 2015-03-24: 2 mg via INTRAVENOUS
  Filled 2015-03-24: qty 0.2

## 2015-03-24 MED ORDER — INSULIN ASPART 100 UNIT/ML ~~LOC~~ SOLN: 0.0000 [IU] | Freq: Every day | SUBCUTANEOUS | Status: DC

## 2015-03-24 MED ORDER — VITAMIN K1 1 MG/0.5ML IJ SOLN
INTRAMUSCULAR | Status: AC
  Filled 2015-03-24: qty 1

## 2015-03-24 MED ORDER — SPIRONOLACTONE 25 MG PO TABS
25.0000 mg | ORAL_TABLET | Freq: Every day | ORAL | Status: DC
  Administered 2015-03-25: 25 mg via ORAL
  Filled 2015-03-24: qty 1

## 2015-03-24 MED ORDER — DEXTROSE 5 % IV SOLN
1.0000 g | Freq: Once | INTRAVENOUS | Status: AC
  Administered 2015-03-24: 21:00:00 via INTRAVENOUS
  Filled 2015-03-24: qty 10

## 2015-03-24 MED ORDER — DEXTROSE 5 % IV SOLN
INTRAVENOUS | Status: AC
  Administered 2015-03-24: 5 mg/h via INTRAVENOUS
  Filled 2015-03-24: qty 100

## 2015-03-24 MED ORDER — DILTIAZEM HCL 100 MG IV SOLR
5.0000 mg/h | INTRAVENOUS | Status: DC
  Administered 2015-03-24: 7.5 mg/h via INTRAVENOUS
  Administered 2015-03-24: 5 mg/h via INTRAVENOUS
  Administered 2015-03-25 (×4): 15 mg/h via INTRAVENOUS
  Administered 2015-03-26: 10 mg/h via INTRAVENOUS
  Filled 2015-03-24 (×5): qty 100

## 2015-03-24 MED ORDER — INSULIN ASPART 100 UNIT/ML ~~LOC~~ SOLN
0.0000 [IU] | Freq: Three times a day (TID) | SUBCUTANEOUS | Status: DC
Start: 2015-03-25 — End: 2015-04-06
  Administered 2015-03-25 – 2015-03-28 (×12): 2 [IU] via SUBCUTANEOUS
  Administered 2015-03-29 – 2015-03-30 (×5): 3 [IU] via SUBCUTANEOUS
  Administered 2015-03-30: 2 [IU] via SUBCUTANEOUS
  Administered 2015-03-31 – 2015-04-01 (×5): 3 [IU] via SUBCUTANEOUS
  Administered 2015-04-01 – 2015-04-02 (×2): 2 [IU] via SUBCUTANEOUS
  Administered 2015-04-02: 3 [IU] via SUBCUTANEOUS
  Administered 2015-04-02 – 2015-04-03 (×4): 2 [IU] via SUBCUTANEOUS
  Administered 2015-04-04 – 2015-04-05 (×5): 3 [IU] via SUBCUTANEOUS
  Administered 2015-04-05: 5 [IU] via SUBCUTANEOUS
  Administered 2015-04-06: 3 [IU] via SUBCUTANEOUS

## 2015-03-24 MED ORDER — DILTIAZEM HCL 25 MG/5ML IV SOLN
15.0000 mg | Freq: Once | INTRAVENOUS | Status: AC
  Administered 2015-03-24: 15 mg via INTRAVENOUS

## 2015-03-24 MED ORDER — TORSEMIDE 20 MG PO TABS: 20.0000 mg | ORAL_TABLET | Freq: Every day | ORAL | Status: DC | PRN

## 2015-03-24 MED ORDER — OMEGA-3-ACID ETHYL ESTERS 1 G PO CAPS
ORAL_CAPSULE | ORAL | Status: DC
  Administered 2015-03-25 – 2015-04-01 (×2): 1 g via ORAL
  Filled 2015-03-24 (×4): qty 1

## 2015-03-24 MED ORDER — VITAMIN D 1000 UNITS PO TABS
5000.0000 [IU] | ORAL_TABLET | Freq: Every day | ORAL | Status: DC
  Administered 2015-03-25 – 2015-04-05 (×12): 5000 [IU] via ORAL
  Filled 2015-03-24 (×13): qty 5

## 2015-03-24 MED ORDER — CETYLPYRIDINIUM CHLORIDE 0.05 % MT LIQD
7.0000 mL | Freq: Two times a day (BID) | OROMUCOSAL | Status: DC
  Administered 2015-03-24 – 2015-04-05 (×24): 7 mL via OROMUCOSAL

## 2015-03-24 MED ORDER — DOCUSATE SODIUM 100 MG PO CAPS
100.0000 mg | ORAL_CAPSULE | Freq: Two times a day (BID) | ORAL | Status: DC
  Administered 2015-03-25 – 2015-03-27 (×5): 100 mg via ORAL
  Filled 2015-03-24 (×5): qty 1

## 2015-03-24 MED ORDER — ATORVASTATIN CALCIUM 20 MG PO TABS
20.0000 mg | ORAL_TABLET | Freq: Every day | ORAL | Status: DC
  Administered 2015-03-25 – 2015-04-09 (×16): 20 mg via ORAL
  Filled 2015-03-24 (×18): qty 1

## 2015-03-24 MED ORDER — DOXYCYCLINE HYCLATE 100 MG PO TABS
100.0000 mg | ORAL_TABLET | Freq: Once | ORAL | Status: AC
  Administered 2015-03-24: 100 mg via ORAL
  Filled 2015-03-24: qty 1

## 2015-03-24 NOTE — ED Notes (Signed)
Dr Conan Bowens in room  to assess patient wound on back per patient husband. Bandage removed with no wound noted
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
## 2015-03-24 NOTE — ED Notes (Signed)
Pt brought in by [facility] for multiple complaints; pt states she has been sob x 2 days to the point she is not able to get up and go to bathroom as she could before; pt is c/o right lower abdominal pain x 3 days with diarrhea and nauseous

## 2015-03-24 NOTE — ED Notes (Signed)
CRITICAL VALUE ALERT  Critical value received:  Lactic acid 2.3   inr 9.8  Date of notification:  04/13/2015  Time of notification:  2250  Critical value read back:yes  Nurse who received alert: Kambree Krauss,rn  MD notified (1st page):  mcmanus  Time of first page:    MD notified (2nd page):  Time of second page:  Responding MD:  mcmanus  Time MD responded:  2250

## 2015-03-25 ENCOUNTER — Inpatient Hospital Stay (HOSPITAL_COMMUNITY): Payer: Medicare Other

## 2015-03-25 ENCOUNTER — Encounter (HOSPITAL_COMMUNITY): Payer: Self-pay | Admitting: Physician Assistant

## 2015-03-25 DIAGNOSIS — I4891 Unspecified atrial fibrillation: Secondary | ICD-10-CM

## 2015-03-25 DIAGNOSIS — I10 Essential (primary) hypertension: Secondary | ICD-10-CM

## 2015-03-25 DIAGNOSIS — Z7901 Long term (current) use of anticoagulants: Secondary | ICD-10-CM

## 2015-03-25 DIAGNOSIS — N183 Chronic kidney disease, stage 3 (moderate): Secondary | ICD-10-CM

## 2015-03-25 DIAGNOSIS — I509 Heart failure, unspecified: Secondary | ICD-10-CM

## 2015-03-25 DIAGNOSIS — I5033 Acute on chronic diastolic (congestive) heart failure: Secondary | ICD-10-CM

## 2015-03-25 LAB — CBC
HCT: 31.5 % — ABNORMAL LOW (ref 36.0–46.0)
HEMOGLOBIN: 10 g/dL — AB (ref 12.0–15.0)
MCH: 27 pg (ref 26.0–34.0)
MCHC: 31.7 g/dL (ref 30.0–36.0)
MCV: 84.9 fL (ref 78.0–100.0)
PLATELETS: 247 10*3/uL (ref 150–400)
RBC: 3.71 MIL/uL — AB (ref 3.87–5.11)
RDW: 16.8 % — ABNORMAL HIGH (ref 11.5–15.5)
WBC: 12.7 10*3/uL — AB (ref 4.0–10.5)

## 2015-03-25 LAB — GLUCOSE, CAPILLARY
Glucose-Capillary: 112 mg/dL — ABNORMAL HIGH (ref 65–99)
Glucose-Capillary: 125 mg/dL — ABNORMAL HIGH (ref 65–99)
Glucose-Capillary: 127 mg/dL — ABNORMAL HIGH (ref 65–99)
Glucose-Capillary: 136 mg/dL — ABNORMAL HIGH (ref 65–99)
Glucose-Capillary: 141 mg/dL — ABNORMAL HIGH (ref 65–99)

## 2015-03-25 LAB — BASIC METABOLIC PANEL
ANION GAP: 11 (ref 5–15)
BUN: 30 mg/dL — ABNORMAL HIGH (ref 6–20)
CHLORIDE: 98 mmol/L — AB (ref 101–111)
CO2: 22 mmol/L (ref 22–32)
CREATININE: 1.62 mg/dL — AB (ref 0.44–1.00)
Calcium: 8.4 mg/dL — ABNORMAL LOW (ref 8.9–10.3)
GFR calc non Af Amer: 32 mL/min — ABNORMAL LOW (ref >60)
GFR, EST AFRICAN AMERICAN: 37 mL/min — AB (ref >60)
Glucose, Bld: 134 mg/dL — ABNORMAL HIGH (ref 65–99)
Potassium: 3.8 mmol/L (ref 3.5–5.1)
SODIUM: 131 mmol/L — AB (ref 135–145)

## 2015-03-25 LAB — PROTIME-INR
INR: 4.66 — ABNORMAL HIGH (ref 0.00–1.49)
Prothrombin Time: 42.6 seconds — ABNORMAL HIGH (ref 11.6–15.2)

## 2015-03-25 LAB — TSH: TSH: 3.501 u[IU]/mL (ref 0.350–4.500)

## 2015-03-25 LAB — MRSA PCR SCREENING: MRSA by PCR: NEGATIVE

## 2015-03-25 MED ORDER — PERFLUTREN LIPID MICROSPHERE
1.0000 mL | INTRAVENOUS | Status: AC | PRN
  Administered 2015-03-25: 3 mL via INTRAVENOUS
  Filled 2015-03-25: qty 10

## 2015-03-25 MED ORDER — FUROSEMIDE 10 MG/ML IJ SOLN
40.0000 mg | Freq: Two times a day (BID) | INTRAMUSCULAR | Status: DC
  Administered 2015-03-25 (×2): 40 mg via INTRAVENOUS
  Filled 2015-03-25 (×2): qty 4

## 2015-03-25 NOTE — Progress Notes (Signed)
[facility] PROGRESS NOTE  Krystal [name] GYF:[id number] DOB: 11/04/1945 DOA: 03/30/2015 PCP: Krystal Kilts, MD  Assessment/Plan: Acute on Chronic Diastolic CHF -On IV lasix. -Significant volume overload on exam. -Follow Is and Os and aim for negative fluid balance. -Admits to dietary non-compliance. Will request nutrition consult.  A Fib with RVR -rate improved. -Remains on a cardizem drip. -Consider switching cardizem to PO in am.  Coagulopathy -Due to coumadin use. -improving; coumadin on hold.  HTN -fair control.  Acute on CKD Stage IV -Improving. -Continue to follow renal function.  Code Status: Full Code Family Communication: Husband at bedside  Disposition Plan: To be determined   Consultants:  Cardiology   Antibiotics:  None   Subjective: SOB improved altho still present  Objective: Filed Vitals:   03/25/15 1200 03/25/15 1208 03/25/15 1300 03/25/15 1400  BP: 131/55  118/62 126/100  Pulse: 90  90 93  Temp:  98.3 F (36.8 C)    TempSrc:  Oral    Resp: 28  29 25   Height:      Weight:      SpO2: 93%  94% 94%    Intake/Output Summary (Last 24 hours) at 03/25/15 1451 Last data filed at 03/25/15 0500  Gross per 24 hour  Intake 125.58 ml  Output    100 ml  Net  25.58 ml   Filed Weights   03/21/2015 1941 03/31/2015 2330 03/25/15 0500  Weight: 131.543 kg (290 lb) 144 kg (317 lb 7.4 oz) 144.3 kg (318 lb 2 oz)    Exam:   General:  AA Ox3  Cardiovascular: RRR  Respiratory: CTA B  Abdomen: S/NT/ND/+BS  Extremities: 2++ edema   Neurologic:  Non-focal  Data Reviewed: Basic Metabolic Panel:  Recent Labs Lab 03/30/2015 1954 03/25/15 0456  NA 131* 131*  K 3.8 3.8  CL 94* 98*  CO2 23 22  GLUCOSE 131* 134*  BUN 28* 30*  CREATININE 1.59* 1.62*  CALCIUM 8.7* 8.4*   Liver Function Tests:  Recent Labs Lab 04/14/2015 1954  AST 25  ALT 6*  ALKPHOS 143*  BILITOT 5.5*  PROT 7.8  ALBUMIN 3.1*    Recent Labs Lab  04/03/2015 1954  LIPASE 22    Recent Labs Lab 04/02/2015 2325  AMMONIA 18   CBC:  Recent Labs Lab 03/21/2015 1954 03/25/15 0456  WBC 15.7* 12.7*  NEUTROABS 13.5*  --   HGB 11.8* 10.0*  HCT 36.0 31.5*  MCV 85.7 84.9  PLT 281 247   Cardiac Enzymes:  Recent Labs Lab 03/23/2015 1954  TROPONINI <0.03   BNP (last 3 results)  Recent Labs  04/11/2015 1954  BNP 422.0*    ProBNP (last 3 results) No results for input(s): PROBNP in the last 8760 hours.  CBG:  Recent Labs Lab 03/25/15 0017 03/25/15 0738 03/25/15 1154  GLUCAP 112* 136* 125*    Recent Results (from the past 240 hour(s))  MRSA PCR Screening     Status: None   Collection Time: 04/07/2015 11:25 PM  Result Value Ref Range Status   MRSA by PCR NEGATIVE NEGATIVE Final    Comment:        The GeneXpert MRSA Assay (FDA approved for NASAL specimens only), is one component of a comprehensive MRSA colonization surveillance program. It is not intended to diagnose MRSA infection nor to guide or monitor treatment for MRSA infections.      Studies: US Abdomen Limited  03/25/2015   CLINICAL DATA:  Krystal [name].  Rule out  ascites.  EXAM: LIMITED ABDOMEN ULTRASOUND FOR ASCITES  TECHNIQUE: Limited ultrasound survey for ascites was performed in all four abdominal quadrants.  COMPARISON:  None.  FINDINGS: Small amount of ascites right lower quadrant.  Otherwise negative.  IMPRESSION: Small amount of ascites right lower quadrant.   Electronically Signed   By: Franchot Gallo M.D.   On: 03/25/2015 14:06   Dg Chest Port 1 View  04/08/2015   CLINICAL DATA:  Increasing shortness of breath for 1 year worse the past several days history of hypertension and congestive heart failure as well as atrial fibrillation  EXAM: PORTABLE CHEST 1 VIEW  COMPARISON:  09/16/2012  FINDINGS: Moderate cardiac enlargement. Aortic calcification. Vascular congestion. No evidence of pulmonary edema. Limited evaluation in the retrocardiac left lower lobe.  Frontal radiograph suggests possible retrocardiac opacities.  IMPRESSION: Stable cardiac enlargement. Possible retrocardiac airspace disease not well characterized on single AP view. Cannot exclude left lower lobe pneumonia or pneumonitis.   Electronically Signed   By: Skipper Cliche M.D.   On: 04/09/2015 20:10    Scheduled Meds: . antiseptic oral rinse  7 mL Mouth Rinse BID  . atorvastatin  20 mg Oral QHS  . cholecalciferol  5,000 Units Oral Daily  . citalopram  20 mg Oral QHS  . docusate sodium  100 mg Oral BID  . furosemide  40 mg Intravenous BID  . Influenza vac split quadrivalent PF  0.5 mL Intramuscular Tomorrow-1000  . insulin aspart  0-15 Units Subcutaneous TID WC  . insulin aspart  0-5 Units Subcutaneous QHS  . omega-3 acid ethyl esters   Oral Weekly  . sodium chloride  3 mL Intravenous Q12H  . spironolactone  25 mg Oral Daily   Continuous Infusions: . diltiazem (CARDIZEM) infusion 15 mg/hr (03/25/15 0818)    Principal Problem:   Atrial fibrillation (HCC) Active Problems:   OBESITY   Essential hypertension   DYSPNEA   Diastolic CHF, acute on chronic ([place])   Long term (current) use of anticoagulants   CKD (chronic kidney disease), stage IV (HCC)   Nonalcoholic fatty liver disease   Atrial fibrillation with rapid ventricular response ([place])    Time spent: 25 minutes. Greater than 50% of this time was spent in direct contact with the patient coordinating care.    Krystal [name]  [facility] Pager [phone]  If 7PM-7AM, please contact night-coverage at [email or web address], password [facility] 03/25/2015, 2:51 PM  LOS: 1 day
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
## 2015-03-25 NOTE — Care Management Note (Addendum)
Case Management Note  Patient Details  Name: Krystal [name] MRN: [id number] Date of Birth: 1945-07-24  Expected Discharge Date:  03/27/15               Expected Discharge Plan:  [place]  In-House Referral:  NA  Discharge planning Services  CM Consult  Post Acute Care Choice:  Home Health Choice offered to:  Patient  DME Arranged:    DME Agency:     HH Arranged:  RN, PT [place] Agency:  [facility]  Status of Service:  In process, will continue to follow  Medicare Important Message Given:    Date Medicare IM Given:    Medicare IM give by:    Date Additional Medicare IM Given:    Additional Medicare Important Message give by:     If discussed at [place] of Stay Meetings, dates discussed:    Additional Comments: Pt admitted with afib. Pt is from home, lives with husband and reports she needs assistance with ADL's. Pt has walker and BSC at home. Pt has used [facility] in the past and would like them again if needed. Anticipate return home with self care and [facility] RN services at DC. Will notify [facility] of referral and representative will obtain pt info from chart. Pt does not require supplemental O2 prior to admission. Will cont to follow for DC planning.  Sherald Barge, RN 03/25/2015, 3:29 PM
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
## 2015-03-26 DIAGNOSIS — D649 Anemia, unspecified: Secondary | ICD-10-CM

## 2015-03-26 DIAGNOSIS — N184 Chronic kidney disease, stage 4 (severe): Secondary | ICD-10-CM

## 2015-03-26 DIAGNOSIS — R791 Abnormal coagulation profile: Secondary | ICD-10-CM | POA: Diagnosis present

## 2015-03-26 DIAGNOSIS — I272 Other secondary pulmonary hypertension: Secondary | ICD-10-CM

## 2015-03-26 DIAGNOSIS — D62 Acute posthemorrhagic anemia: Secondary | ICD-10-CM | POA: Diagnosis present

## 2015-03-26 DIAGNOSIS — R310 Gross hematuria: Secondary | ICD-10-CM | POA: Diagnosis present

## 2015-03-26 LAB — GLUCOSE, CAPILLARY
GLUCOSE-CAPILLARY: 133 mg/dL — AB (ref 65–99)
GLUCOSE-CAPILLARY: 142 mg/dL — AB (ref 65–99)
GLUCOSE-CAPILLARY: 137 mg/dL — AB (ref 65–99)
Glucose-Capillary: 133 mg/dL — ABNORMAL HIGH (ref 65–99)

## 2015-03-26 LAB — URINALYSIS, ROUTINE W REFLEX MICROSCOPIC
Glucose, UA: NEGATIVE mg/dL
Ketones, ur: NEGATIVE mg/dL
Nitrite: NEGATIVE
Protein, ur: 30 mg/dL — AB
SPECIFIC GRAVITY, URINE: 1.015 (ref 1.005–1.030)
UROBILINOGEN UA: 1 mg/dL (ref 0.0–1.0)
pH: 5.5 (ref 5.0–8.0)

## 2015-03-26 LAB — CBC
HEMATOCRIT: 29 % — AB (ref 36.0–46.0)
Hemoglobin: 9.4 g/dL — ABNORMAL LOW (ref 12.0–15.0)
MCH: 27.2 pg (ref 26.0–34.0)
MCHC: 32.4 g/dL (ref 30.0–36.0)
MCV: 83.8 fL (ref 78.0–100.0)
PLATELETS: 229 10*3/uL (ref 150–400)
RBC: 3.46 MIL/uL — ABNORMAL LOW (ref 3.87–5.11)
RDW: 17 % — AB (ref 11.5–15.5)
WBC: 11.2 10*3/uL — AB (ref 4.0–10.5)

## 2015-03-26 LAB — URINE MICROSCOPIC-ADD ON

## 2015-03-26 LAB — PROTIME-INR
INR: 2.43 — AB (ref 0.00–1.49)
PROTHROMBIN TIME: 26.1 s — AB (ref 11.6–15.2)

## 2015-03-26 LAB — BASIC METABOLIC PANEL
ANION GAP: 11 (ref 5–15)
BUN: 32 mg/dL — ABNORMAL HIGH (ref 6–20)
CO2: 24 mmol/L (ref 22–32)
CREATININE: 1.59 mg/dL — AB (ref 0.44–1.00)
Calcium: 8.9 mg/dL (ref 8.9–10.3)
Chloride: 96 mmol/L — ABNORMAL LOW (ref 101–111)
GFR calc non Af Amer: 32 mL/min — ABNORMAL LOW (ref >60)
GFR, EST AFRICAN AMERICAN: 37 mL/min — AB (ref >60)
Glucose, Bld: 138 mg/dL — ABNORMAL HIGH (ref 65–99)
Potassium: 3.6 mmol/L (ref 3.5–5.1)
SODIUM: 131 mmol/L — AB (ref 135–145)

## 2015-03-26 MED ORDER — DILTIAZEM HCL 60 MG PO TABS
60.0000 mg | ORAL_TABLET | Freq: Four times a day (QID) | ORAL | Status: DC
  Administered 2015-03-26 – 2015-04-05 (×42): 60 mg via ORAL
  Filled 2015-03-26 (×42): qty 1

## 2015-03-26 MED ORDER — SPIRONOLACTONE 25 MG PO TABS
12.5000 mg | ORAL_TABLET | Freq: Every day | ORAL | Status: DC
  Administered 2015-03-26 – 2015-03-27 (×2): 12.5 mg via ORAL
  Filled 2015-03-26 (×2): qty 1

## 2015-03-26 MED ORDER — PANTOPRAZOLE SODIUM 40 MG PO TBEC
40.0000 mg | DELAYED_RELEASE_TABLET | Freq: Every day | ORAL | Status: DC
  Administered 2015-03-26 – 2015-04-05 (×11): 40 mg via ORAL
  Filled 2015-03-26 (×12): qty 1

## 2015-03-26 MED ORDER — ZOLPIDEM TARTRATE 5 MG PO TABS
5.0000 mg | ORAL_TABLET | Freq: Every evening | ORAL | Status: DC | PRN
  Administered 2015-03-27 – 2015-04-01 (×5): 5 mg via ORAL
  Filled 2015-03-26 (×5): qty 1

## 2015-03-26 MED ORDER — DIPHENHYDRAMINE HCL 12.5 MG/5ML PO ELIX: 12.5000 mg | ORAL_SOLUTION | Freq: Every evening | ORAL | Status: DC | PRN

## 2015-03-26 MED ORDER — FUROSEMIDE 10 MG/ML IJ SOLN
8.0000 mg/h | INTRAVENOUS | Status: DC
  Administered 2015-03-26: 3 mg/h via INTRAVENOUS
  Administered 2015-03-28 – 2015-03-31 (×3): 8 mg/h via INTRAVENOUS
  Filled 2015-03-26 (×5): qty 25
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
## 2015-03-26 NOTE — Progress Notes (Signed)
Primary cardiologist: Dr. Kate Sable  Seen for followup: CHF, atrial fibrillation  Subjective:    States that she did not sleep well last night. Still short of breath, no palpitations or chest pain. Appetite is fair. No active abdominal pain or dysuria.  Objective:   Temp:  [98.1 F (36.7 C)-98.4 F (36.9 C)] 98.4 F (36.9 C) (10/12 0802) Pulse Rate:  [87-106] 94 (10/12 0700) Resp:  [18-34] 23 (10/12 0700) BP: (108-153)/(48-105) 108/55 mmHg (10/12 0600) SpO2:  [93 %-95 %] 94 % (10/12 0700) Weight:  [320 lb 8.8 oz (145.4 kg)] 320 lb 8.8 oz (145.4 kg) (10/12 0500) Last BM Date: 04/05/2015  Filed Weights   04/12/2015 2330 03/25/15 0500 03/26/15 0500  Weight: 317 lb 7.4 oz (144 kg) 318 lb 2 oz (144.3 kg) 320 lb 8.8 oz (145.4 kg)    Intake/Output Summary (Last 24 hours) at 03/26/15 0805 Last data filed at 03/26/15 0700  Gross per 24 hour  Intake 6399.59 ml  Output    500 ml  Net 5899.59 ml    Telemetry: Atrial fibrillation around 100 bpm.  Exam:  General: Morbidly obese, mildly short of breath.  Lungs: Decreased breath sounds.  Cardiac: Elevated JVP. Irregularly irregular without gallop.  Abdomen: Obese, protuberant. NABS.  Extremities: Chronic appearing edema.  Lab Results:  Basic Metabolic Panel:  Recent Labs Lab 04/11/2015 1954 03/25/15 0456 03/26/15 0527  NA 131* 131* 131*  K 3.8 3.8 3.6  CL 94* 98* 96*  CO2 23 22 24   GLUCOSE 131* 134* 138*  BUN 28* 30* 32*  CREATININE 1.59* 1.62* 1.59*  CALCIUM 8.7* 8.4* 8.9    Liver Function Tests:  Recent Labs Lab 03/18/2015 1954  AST 25  ALT 6*  ALKPHOS 143*  BILITOT 5.5*  PROT 7.8  ALBUMIN 3.1*    CBC:  Recent Labs Lab 03/22/2015 1954 03/25/15 0456 03/26/15 0527  WBC 15.7* 12.7* 11.2*  HGB 11.8* 10.0* 9.4*  HCT 36.0 31.5* 29.0*  MCV 85.7 84.9 83.8  PLT 281 247 229    Cardiac Enzymes:  Recent Labs Lab 03/18/2015 1954  TROPONINI <0.03    Coagulation:  Recent Labs Lab  03/21/2015 1954 03/25/15 0456 03/26/15 0527  INR 9.81* 4.66* 2.43*    Echocardiogram 03/25/2015: Study Conclusions  - Left ventricle: The cavity size was normal. Wall thickness was increased in a pattern of mild LVH. Systolic function was normal. The estimated ejection fraction was in the range of 60% to 65%. Wall motion was normal; there were no regional wall motion abnormalities. The study is not technically sufficient to allow evaluation of LV diastolic function. - Ventricular septum: The contour showed diastolic flattening and systolic flattening. - Aortic valve: Mildly calcified annulus. Trileaflet. - Mitral valve: Calcified annulus. There was trivial regurgitation. - Left atrium: The atrium was mildly dilated. - Right ventricle: The cavity size was severely dilated. Systolic function was reduced - difficult to assess. - Right atrium: The atrium was mildly dilated. - Tricuspid valve: There was moderate regurgitation. Peak RV-RA gradient (S): 65 mm Hg. - Pulmonary arteries: Systolic pressure could not be accurately estimated. - Pericardium, extracardiac: There was no pericardial effusion.  Impressions:  - Mild LVH with LVEF 60-65%, indeterminate diastolic function in the setting of atrial fibrillation. Septal flatening consistent with RV pressure and volume overload. Mild biatrial enlargement. Mildly sclerotic aortic valve. Severe RV dilatation with somewhat reduced contraction - difficult to assess. Moderate tricuspid regurgitation with suspected severe pulmonary hypertension, RV-RA gradient 65 mmHg. Unable to  assess CVP.  Abdominal ultrasound 03/25/2015: TECHNIQUE: Limited ultrasound survey for ascites was performed in all four abdominal quadrants.  COMPARISON: None.  FINDINGS: Small amount of ascites right lower quadrant. Otherwise negative.  IMPRESSION: Small amount of ascites right lower quadrant.    Medications:    Scheduled Medications: . antiseptic oral rinse  7 mL Mouth Rinse BID  . atorvastatin  20 mg Oral QHS  . cholecalciferol  5,000 Units Oral Daily  . citalopram  20 mg Oral QHS  . docusate sodium  100 mg Oral BID  . furosemide  40 mg Intravenous BID  . Influenza vac split quadrivalent PF  0.5 mL Intramuscular Tomorrow-1000  . insulin aspart  0-15 Units Subcutaneous TID WC  . insulin aspart  0-5 Units Subcutaneous QHS  . omega-3 acid ethyl esters   Oral Weekly  . sodium chloride  3 mL Intravenous Q12H  . spironolactone  25 mg Oral Daily     Infusions: . diltiazem (CARDIZEM) infusion 10 mg/hr (03/26/15 0700)      Assessment:   1. Volume overload with suspected diastolic heart failure and component of RV dysfunction with pulmonary hypertension. Has not made much progress in terms of diuresis. Weight has gone up. She has a small amount of ascites in the right lower quadrant, no indication for paracentesis.  2. Chronic atrial fibrillation. She has been on intravenous diltiazem, Coumadin is on hold as patient presented with supratherapeutic INR. INR now down to 2.4. She is been followed by pharmacy.  3. CKD stage 3. Creatinine 1.6, stable.  4. Anemia, hemoglobin down to 9.4 from 11.8.  5. Dark urine, question hematuria. Needs urinalysis.   Plan/Discussion:    Discussed with patient and husband, medication adjustments are needed. Will discontinue intravenous diltiazem and switch to oral regimen for heart rate control. Initiating Lasix drip starting at 3 mg per hour and increasing as needed to achieve adequate urine output. Sending urinalysis, would also heme check stools. It does not look like paracentesis will be necessary.   Satira Sark, M.D., F.A.C.C.

## 2015-03-26 NOTE — Progress Notes (Addendum)
[facility] PROGRESS NOTE  Krystal [name] KYH:[id number] DOB: [birth year] DOA: 04/04/2015 PCP: Purvis Kilts, MD    Code Status: Full code Family Communication: Discussed with husband Disposition Plan: Discharge  when clinically appropriate   Consultants:  Cardiology  Procedures: Echocardiogram 03/25/2015: Study Conclusions  - Left ventricle: The cavity size was normal. Wall thickness was increased in a pattern of mild LVH. Systolic function was normal. The estimated ejection fraction was in the range of 60% to 65%. Wall motion was normal; there were no regional wall motion abnormalities. The study is not technically sufficient to allow evaluation of LV diastolic function. - Ventricular septum: The contour showed diastolic flattening and systolic flattening. - Aortic valve: Mildly calcified annulus. Trileaflet. - Mitral valve: Calcified annulus. There was trivial regurgitation. - Left atrium: The atrium was mildly dilated. - Right ventricle: The cavity size was severely dilated. Systolic function was reduced - difficult to assess. - Right atrium: The atrium was mildly dilated. - Tricuspid valve: There was moderate regurgitation. Peak RV-RA gradient (S): 65 mm Hg. - Pulmonary arteries: Systolic pressure could not be accurately estimated. - Pericardium, extracardiac: There was no pericardial effusion. Impressions: - Mild LVH with LVEF 60-65%, indeterminate diastolic function in the setting of atrial fibrillation. Septal flatening consistent with RV pressure and volume overload. Mild biatrial enlargement. Mildly sclerotic aortic valve. Severe RV dilatation with somewhat reduced contraction - difficult to assess. Moderate tricuspid regurgitation with suspected severe pulmonary hypertension, RV-RA gradient 65 mmHg. Unable to assess CVP.  Antibiotics:  None  HPI/Subjective: Patient says that she is still somewhat short of  breath. She denies chest pain. She reports not sleeping well last night. She denies any pain with the Foley catheter or pain with urination prior to hospitalization.  Objective: Filed Vitals:   03/26/15 0942  BP: 131/59  Pulse:   Temp:   Resp:    temperature 98.4. Respiratory rate 27. Heart rate 94. Blood pressure 131/59. Oxygen saturation 93% on supplemental oxygen.  Intake/Output Summary (Last 24 hours) at 03/26/15 1036 Last data filed at 03/26/15 0800  Gross per 24 hour  Intake 6289.59 ml  Output    500 ml  Net 5789.59 ml   Filed Weights   03/30/2015 2330 03/25/15 0500 03/26/15 0500  Weight: 144 kg (317 lb 7.4 oz) 144.3 kg (318 lb 2 oz) 145.4 kg (320 lb 8.8 oz)    Exam:   General:  Obese ill-appearing 69 year old woman in no acute distress.  Cardiovascular: Irregular, irregular.  Respiratory: Decreased breath sounds in the bases; occasional crackles in the mid lobes  Abdomen: Obese, positive bowel sounds, nontender, nondistended.  GU: Pink-colored urine in the Foley catheter bag noted.  Musculoskeletal/extremities: 1+ nonpitting edema.  Neurologic/psychiatric: Flat affect, but alert and oriented 2. Cranial nerves II through XII are intact.   Data Reviewed: Basic Metabolic Panel:  Recent Labs Lab 04/03/2015 1954 03/25/15 0456 03/26/15 0527  NA 131* 131* 131*  K 3.8 3.8 3.6  CL 94* 98* 96*  CO2 23 22 24   GLUCOSE 131* 134* 138*  BUN 28* 30* 32*  CREATININE 1.59* 1.62* 1.59*  CALCIUM 8.7* 8.4* 8.9   Liver Function Tests:  Recent Labs Lab 03/16/2015 1954  AST 25  ALT 6*  ALKPHOS 143*  BILITOT 5.5*  PROT 7.8  ALBUMIN 3.1*    Recent Labs Lab 03/28/2015 1954  LIPASE 22    Recent Labs Lab 03/22/2015 2325  AMMONIA 18   CBC:  Recent Labs Lab 04/05/2015 1954 03/25/15 0456 03/26/15  0527  WBC 15.7* 12.7* 11.2*  NEUTROABS 13.5*  --   --   HGB 11.8* 10.0* 9.4*  HCT 36.0 31.5* 29.0*  MCV 85.7 84.9 83.8  PLT 281 247 229   Cardiac Enzymes:  Recent  Labs Lab 03/25/2015 1954  TROPONINI <0.03   BNP (last 3 results)  Recent Labs  04/01/2015 1954  BNP 422.0*    ProBNP (last 3 results) No results for input(s): PROBNP in the last 8760 hours.  CBG:  Recent Labs Lab 03/25/15 0738 03/25/15 1154 03/25/15 1638 03/25/15 2055 03/26/15 0742  GLUCAP 136* 125* 127* 141* 133*    Recent Results (from the past 240 hour(s))  MRSA PCR Screening     Status: None   Collection Time: 03/25/2015 11:25 PM  Result Value Ref Range Status   MRSA by PCR NEGATIVE NEGATIVE Final    Comment:        The GeneXpert MRSA Assay (FDA approved for NASAL specimens only), is one component of a comprehensive MRSA colonization surveillance program. It is not intended to diagnose MRSA infection nor to guide or monitor treatment for MRSA infections.      Studies: US Abdomen Limited  03/25/2015  CLINICAL DATA:  [name].  Rule out ascites. EXAM: LIMITED ABDOMEN ULTRASOUND FOR ASCITES TECHNIQUE: Limited ultrasound survey for ascites was performed in all four abdominal quadrants. COMPARISON:  None. FINDINGS: Small amount of ascites right lower quadrant.  Otherwise negative. IMPRESSION: Small amount of ascites right lower quadrant. Electronically Signed   By: Franchot Gallo M.D.   On: 03/25/2015 14:06   Dg Chest Port 1 View  03/26/2015  CLINICAL DATA:  Increasing shortness of breath for 1 year worse the past several days history of hypertension and congestive heart failure as well as atrial fibrillation EXAM: PORTABLE CHEST 1 VIEW COMPARISON:  09/16/2012 FINDINGS: Moderate cardiac enlargement. Aortic calcification. Vascular congestion. No evidence of pulmonary edema. Limited evaluation in the retrocardiac left lower lobe. Frontal radiograph suggests possible retrocardiac opacities. IMPRESSION: Stable cardiac enlargement. Possible retrocardiac airspace disease not well characterized on single AP view. Cannot exclude left lower lobe pneumonia or pneumonitis.  Electronically Signed   By: Skipper Cliche M.D.   On: 04/09/2015 20:10    Scheduled Meds: . antiseptic oral rinse  7 mL Mouth Rinse BID  . atorvastatin  20 mg Oral QHS  . cholecalciferol  5,000 Units Oral Daily  . citalopram  20 mg Oral QHS  . diltiazem  60 mg Oral 4 times per day  . docusate sodium  100 mg Oral BID  . Influenza vac split quadrivalent PF  0.5 mL Intramuscular Tomorrow-1000  . insulin aspart  0-15 Units Subcutaneous TID WC  . insulin aspart  0-5 Units Subcutaneous QHS  . omega-3 acid ethyl esters   Oral Weekly  . sodium chloride  3 mL Intravenous Q12H  . spironolactone  12.5 mg Oral Daily   Continuous Infusions: . furosemide (LASIX) infusion 3 mg/hr (03/26/15 0945)   Assessment and plan:  Principal Problem:   Atrial fibrillation (HCC) Active Problems:   Atrial fibrillation with rapid ventricular response (HCC)   Diastolic CHF, acute on chronic (HCC)   CKD (chronic kidney disease), stage IV (HCC)   Nonalcoholic fatty liver disease   Diabetes mellitus with nephropathy ([place])   OBESITY   Essential hypertension   Long term (current) use of anticoagulants   Hematuria, gross   Acute blood loss anemia    1. Acute on chronic diastolic congestive heart failure with superimposed right  heart dysfunction and pulmonary hypertension. On admission, on exam, the patient had significant volume overload. She admitted to dietary noncompliance. Patient was started on IV Lasix. Cardiology was consulted and started the patient on a Lasix drip given that she has not had significant urine output/diuresis. She is being continued on spironolactone.  Chronic atrial fibrillation with RVR on admission. The patient is treated chronically with verapamil and Coumadin. Her INR was supratherapeutic at 9.81. It is being held. Verapamil is being held and she was started on a diltiazem drip. Cardiology has discontinued the diltiazem drip on 10/12 and started her on oral  diltiazem. Echocardiogram results noted above.  Warfarin coagulopathy. The patient is treated with warfarin for chronic age fibrillation. Her INR was supratherapeutic at 9.81. It looks like she may have mild hematuria. Her hemoglobin has drifted down from 11.8 on admission to 9.4. Her INR is now in the therapeutic range. -We'll consider restarting Coumadin cautiously; dosing per pharmacy; tomorrow.  Anemia, likely secondary to acute blood loss in the setting of anticoagulation. Patient's hemoglobin was 11.8 on admission and has drifted down to 9.4. She denies bright red blood per rectum or melena, but she has some hematuria. -Urinalysis ordered -We'll at PPI empirically.  Chronic kidney disease, stage 3 to stage IV. Patient's creatinine has been stable, ranging from 1.59-1.62.  Diabetes mellitus with nephropathy. Patient is treated chronically with Humalog each morning; it is being held and she is being treated with sliding scale NovoLog area Her CBGs have been reasonable.  Nonalcoholic fatty liver disease. She has hyperbilirubinemia, but no significant transaminitis. Abdominal ultrasound on 10/11 revealed small amount of ascites in the right lower quadrant. Will continue spironolactone. Will order an ammonia level tomorrow morning.   Time spent: 40 minutes.    [facility] Pager [phone]. If 7PM-7AM, please contact night-coverage at [email or web address], password [facility] 03/26/2015, 10:36 AM  LOS: 2 days

## 2015-03-27 DIAGNOSIS — N390 Urinary tract infection, site not specified: Secondary | ICD-10-CM | POA: Insufficient documentation

## 2015-03-27 DIAGNOSIS — K7581 Nonalcoholic steatohepatitis (NASH): Secondary | ICD-10-CM | POA: Diagnosis present

## 2015-03-27 DIAGNOSIS — D62 Acute posthemorrhagic anemia: Secondary | ICD-10-CM

## 2015-03-27 LAB — GLUCOSE, CAPILLARY
GLUCOSE-CAPILLARY: 138 mg/dL — AB (ref 65–99)
Glucose-Capillary: 127 mg/dL — ABNORMAL HIGH (ref 65–99)
Glucose-Capillary: 128 mg/dL — ABNORMAL HIGH (ref 65–99)
Glucose-Capillary: 143 mg/dL — ABNORMAL HIGH (ref 65–99)

## 2015-03-27 LAB — BASIC METABOLIC PANEL
ANION GAP: 13 (ref 5–15)
BUN: 33 mg/dL — AB (ref 6–20)
CO2: 23 mmol/L (ref 22–32)
Calcium: 9.1 mg/dL (ref 8.9–10.3)
Chloride: 96 mmol/L — ABNORMAL LOW (ref 101–111)
Creatinine, Ser: 1.48 mg/dL — ABNORMAL HIGH (ref 0.44–1.00)
GFR calc Af Amer: 41 mL/min — ABNORMAL LOW (ref >60)
GFR calc non Af Amer: 35 mL/min — ABNORMAL LOW (ref >60)
GLUCOSE: 141 mg/dL — AB (ref 65–99)
POTASSIUM: 3.5 mmol/L (ref 3.5–5.1)
Sodium: 132 mmol/L — ABNORMAL LOW (ref 135–145)

## 2015-03-27 LAB — PROTIME-INR
INR: 3.22 — ABNORMAL HIGH (ref 0.00–1.49)
PROTHROMBIN TIME: 32.3 s — AB (ref 11.6–15.2)

## 2015-03-27 MED ORDER — SENNOSIDES-DOCUSATE SODIUM 8.6-50 MG PO TABS
2.0000 | ORAL_TABLET | Freq: Every day | ORAL | Status: DC
  Administered 2015-03-27 – 2015-04-04 (×8): 2 via ORAL
  Filled 2015-03-27 (×9): qty 2

## 2015-03-27 MED ORDER — WARFARIN - PHARMACIST DOSING INPATIENT
Status: DC
  Administered 2015-03-29: 16:00:00

## 2015-03-27 MED ORDER — FAMOTIDINE 20 MG PO TABS
20.0000 mg | ORAL_TABLET | Freq: Every day | ORAL | Status: DC
  Administered 2015-03-27 – 2015-04-05 (×10): 20 mg via ORAL
  Filled 2015-03-27 (×10): qty 1

## 2015-03-27 MED ORDER — LEVALBUTEROL HCL 0.63 MG/3ML IN NEBU
0.6300 mg | INHALATION_SOLUTION | Freq: Three times a day (TID) | RESPIRATORY_TRACT | Status: DC
  Administered 2015-03-27 – 2015-04-01 (×15): 0.63 mg via RESPIRATORY_TRACT
  Filled 2015-03-27 (×15): qty 3

## 2015-03-27 NOTE — Progress Notes (Signed)
Primary cardiologist: Dr. Kate Sable  Seen for followup: CHF, atrial fibrillation  Subjective:    Feels a little better today. Slept better last night. Appetite is fair. No chest pain.  Objective:   Temp:  [98 F (36.7 C)-98.6 F (37 C)] 98.2 F (36.8 C) (10/13 0739) Pulse Rate:  [87-111] 99 (10/13 0700) Resp:  [18-37] 24 (10/13 0700) BP: (83-147)/(41-125) 136/65 mmHg (10/13 0700) SpO2:  [90 %-96 %] 96 % (10/13 0700) FiO2 (%):  [32 %] 32 % (10/13 0739) Weight:  [323 lb 13.7 oz (146.9 kg)] 323 lb 13.7 oz (146.9 kg) (10/13 0500) Last BM Date: 04/03/2015  Filed Weights   03/25/15 0500 03/26/15 0500 03/27/15 0500  Weight: 318 lb 2 oz (144.3 kg) 320 lb 8.8 oz (145.4 kg) 323 lb 13.7 oz (146.9 kg)    Intake/Output Summary (Last 24 hours) at 03/27/15 0950 Last data filed at 03/27/15 0500  Gross per 24 hour  Intake  57.75 ml  Output    900 ml  Net -842.25 ml    Telemetry: Atrial fibrillation around 100 bpm.  Exam:  General: Morbidly obese, mildly short of breath.  Lungs: Decreased breath sounds.  Cardiac: Elevated JVP. Irregularly irregular without gallop.  Abdomen: Obese, protuberant. NABS.  Extremities: Chronic appearing edema.  Lab Results:  Basic Metabolic Panel:  Recent Labs Lab 03/25/15 0456 03/26/15 0527 03/27/15 0455  NA 131* 131* 132*  K 3.8 3.6 3.5  CL 98* 96* 96*  CO2 22 24 23   GLUCOSE 134* 138* 141*  BUN 30* 32* 33*  CREATININE 1.62* 1.59* 1.48*  CALCIUM 8.4* 8.9 9.1    Liver Function Tests:  Recent Labs Lab 03/19/2015 1954  AST 25  ALT 6*  ALKPHOS 143*  BILITOT 5.5*  PROT 7.8  ALBUMIN 3.1*    CBC:  Recent Labs Lab 04/12/2015 1954 03/25/15 0456 03/26/15 0527  WBC 15.7* 12.7* 11.2*  HGB 11.8* 10.0* 9.4*  HCT 36.0 31.5* 29.0*  MCV 85.7 84.9 83.8  PLT 281 247 229    Cardiac Enzymes:  Recent Labs Lab 03/22/2015 1954  TROPONINI <0.03    Coagulation:  Recent Labs Lab 03/25/15 0456 03/26/15 0527 03/27/15 0455   INR 4.66* 2.43* 3.22*    Echocardiogram 03/25/2015: Study Conclusions  - Left ventricle: The cavity size was normal. Wall thickness was increased in a pattern of mild LVH. Systolic function was normal. The estimated ejection fraction was in the range of 60% to 65%. Wall motion was normal; there were no regional wall motion abnormalities. The study is not technically sufficient to allow evaluation of LV diastolic function. - Ventricular septum: The contour showed diastolic flattening and systolic flattening. - Aortic valve: Mildly calcified annulus. Trileaflet. - Mitral valve: Calcified annulus. There was trivial regurgitation. - Left atrium: The atrium was mildly dilated. - Right ventricle: The cavity size was severely dilated. Systolic function was reduced - difficult to assess. - Right atrium: The atrium was mildly dilated. - Tricuspid valve: There was moderate regurgitation. Peak RV-RA gradient (S): 65 mm Hg. - Pulmonary arteries: Systolic pressure could not be accurately estimated. - Pericardium, extracardiac: There was no pericardial effusion.  Impressions:  - Mild LVH with LVEF 60-65%, indeterminate diastolic function in the setting of atrial fibrillation. Septal flatening consistent with RV pressure and volume overload. Mild biatrial enlargement. Mildly sclerotic aortic valve. Severe RV dilatation with somewhat reduced contraction - difficult to assess. Moderate tricuspid regurgitation with suspected severe pulmonary hypertension, RV-RA gradient 65 mmHg. Unable to assess CVP.  Abdominal ultrasound 03/25/2015: TECHNIQUE: Limited ultrasound survey for ascites was performed in all four abdominal quadrants.  COMPARISON: None.  FINDINGS: Small amount of ascites right lower quadrant. Otherwise negative.  IMPRESSION: Small amount of ascites right lower quadrant.   Medications:   Scheduled Medications: . antiseptic oral rinse  7 mL  Mouth Rinse BID  . atorvastatin  20 mg Oral QHS  . cholecalciferol  5,000 Units Oral Daily  . citalopram  20 mg Oral QHS  . diltiazem  60 mg Oral 4 times per day  . docusate sodium  100 mg Oral BID  . Influenza vac split quadrivalent PF  0.5 mL Intramuscular Tomorrow-1000  . insulin aspart  0-15 Units Subcutaneous TID WC  . insulin aspart  0-5 Units Subcutaneous QHS  . omega-3 acid ethyl esters   Oral Weekly  . pantoprazole  40 mg Oral Daily  . sodium chloride  3 mL Intravenous Q12H  . spironolactone  12.5 mg Oral Daily  . [START ON 03/28/2015] Warfarin - Pharmacist Dosing Inpatient   Does not apply Q24H    Infusions: . furosemide (LASIX) infusion 3 mg/hr (03/27/15 0701)     Assessment:   1. Volume overload with suspected diastolic heart failure and component of RV dysfunction with pulmonary hypertension. Approximately 800 cc out more than and yesterday, shows progress with Lasix drip. Still has a long way to go however.  2. Chronic atrial fibrillation. She is been followed by pharmacy. Coumadin held initially with supratherapeutic INR. She has been converted to oral diltiazem.  3. CKD stage 3. Creatinine down to 1.4.  4. Anemia, hemoglobin down to 9.4 from 11.8.  5. Hematuria by UA, Foley in place. Further evaluation per primary team.   Plan/Discussion:    Plan to increase Lasix drip to 5 mg/h, may need to go higher depending on urine output and stability in renal function. No change in oral diltiazem for now.   Satira Sark, M.D., F.A.C.C.

## 2015-03-27 NOTE — Progress Notes (Signed)
[facility] PROGRESS NOTE  Krystal [name] GLO:[id number] DOB: 08-30-1945 DOA: 04/07/2015 PCP: Krystal Kilts, MD    Code Status: Full code Family Communication: Discussed with husband Disposition Plan: Discharge  when clinically appropriate   Consultants:  Cardiology  Procedures: Echocardiogram 03/25/2015: Study Conclusions  - Left ventricle: The cavity size was normal. Wall thickness was increased in a pattern of mild LVH. Systolic function was normal. The estimated ejection fraction was in the range of 60% to 65%. Wall motion was normal; there were no regional wall motion abnormalities. The study is not technically sufficient to allow evaluation of LV diastolic function. - Ventricular septum: The contour showed diastolic flattening and systolic flattening. - Aortic valve: Mildly calcified annulus. Trileaflet. - Mitral valve: Calcified annulus. There was trivial regurgitation. - Left atrium: The atrium was mildly dilated. - Right ventricle: The cavity size was severely dilated. Systolic function was reduced - difficult to assess. - Right atrium: The atrium was mildly dilated. - Tricuspid valve: There was moderate regurgitation. Peak RV-RA gradient (S): 65 mm Hg. - Pulmonary arteries: Systolic pressure could not be accurately estimated. - Pericardium, extracardiac: There was no pericardial effusion. Impressions: - Mild LVH with LVEF 60-65%, indeterminate diastolic function in the setting of atrial fibrillation. Septal flatening consistent with RV pressure and volume overload. Mild biatrial enlargement. Mildly sclerotic aortic valve. Severe RV dilatation with somewhat reduced contraction - difficult to assess. Moderate tricuspid regurgitation with suspected severe pulmonary hypertension, RV-RA gradient 65 mmHg. Unable to assess CVP.  Antibiotics:  None.... Starting Rocephin.  HPI/Subjective: Patient says that she is still  somewhat short of breath, but breathing better than yesterday. She reports having some gastrointestinal symptoms, possibly a gastroenteritis, one week prior to hospitalization.  Objective: Filed Vitals:   03/27/15 0739  BP:   Pulse:   Temp: 98.2 F (36.8 C)  Resp:    temperature 98.2. Pulse 99. Respiratory rate 24. The pressure 136/65. Oxygen saturation 96% on nasal cannula oxygen.  Intake/Output Summary (Last 24 hours) at 03/27/15 0950 Last data filed at 03/27/15 0500  Gross per 24 hour  Intake  57.75 ml  Output    900 ml  Net -842.25 ml   Filed Weights   03/25/15 0500 03/26/15 0500 03/27/15 0500  Weight: 144.3 kg (318 lb 2 oz) 145.4 kg (320 lb 8.8 oz) 146.9 kg (323 lb 13.7 oz)    Exam:   General:  Obese ill-appearing 69 year old woman in no acute distress.  Cardiovascular: Irregular, irregular with borderline tachycardia.  Respiratory: Decreased breath sounds in the bases; occasional and wheezes crackles in the mid lobes  Abdomen: Obese, positive bowel sounds, minimal hypogastrium tenderness; nondistended.  GU: Dark yellow colored urine in the Foley catheter bag noted.  Musculoskeletal/extremities: 1+ nonpitting edema.  Neurologic/psychiatric: Flat affect, but alert and oriented 2. Cranial nerves II through XII are intact.   Data Reviewed: Basic Metabolic Panel:  Recent Labs Lab 04/07/2015 1954 03/25/15 0456 03/26/15 0527 03/27/15 0455  NA 131* 131* 131* 132*  K 3.8 3.8 3.6 3.5  CL 94* 98* 96* 96*  CO2 23 22 24 23   GLUCOSE 131* 134* 138* 141*  BUN 28* 30* 32* 33*  CREATININE 1.59* 1.62* 1.59* 1.48*  CALCIUM 8.7* 8.4* 8.9 9.1   Liver Function Tests:  Recent Labs Lab 04/05/2015 1954  AST 25  ALT 6*  ALKPHOS 143*  BILITOT 5.5*  PROT 7.8  ALBUMIN 3.1*    Recent Labs Lab 04/05/2015 1954  LIPASE 22    Recent Labs  Lab 04/01/2015 2325  AMMONIA 18   CBC:  Recent Labs Lab 04/14/2015 1954 03/25/15 0456 03/26/15 0527  WBC 15.7* 12.7* 11.2*   NEUTROABS 13.5*  --   --   HGB 11.8* 10.0* 9.4*  HCT 36.0 31.5* 29.0*  MCV 85.7 84.9 83.8  PLT 281 247 229   Cardiac Enzymes:  Recent Labs Lab 04/02/2015 1954  TROPONINI <0.03   BNP (last 3 results)  Recent Labs  04/07/2015 1954  BNP 422.0*    ProBNP (last 3 results) No results for input(s): PROBNP in the last 8760 hours.  CBG:  Recent Labs Lab 03/26/15 0742 03/26/15 1157 03/26/15 1643 03/26/15 2153 03/27/15 0725  GLUCAP 133* 142* 133* 137* 127*    Recent Results (from the past 240 hour(s))  MRSA PCR Screening     Status: None   Collection Time: 04/03/2015 11:25 PM  Result Value Ref Range Status   MRSA by PCR NEGATIVE NEGATIVE Final    Comment:        The GeneXpert MRSA Assay (FDA approved for NASAL specimens only), is one component of a comprehensive MRSA colonization surveillance program. It is not intended to diagnose MRSA infection nor to guide or monitor treatment for MRSA infections.      Studies: US Abdomen Limited  03/25/2015  CLINICAL DATA:  Krystal [name].  Rule out ascites. EXAM: LIMITED ABDOMEN ULTRASOUND FOR ASCITES TECHNIQUE: Limited ultrasound survey for ascites was performed in all four abdominal quadrants. COMPARISON:  None. FINDINGS: Small amount of ascites right lower quadrant.  Otherwise negative. IMPRESSION: Small amount of ascites right lower quadrant. Electronically Signed   By: Franchot Gallo M.D.   On: 03/25/2015 14:06    Scheduled Meds: . antiseptic oral rinse  7 mL Mouth Rinse BID  . atorvastatin  20 mg Oral QHS  . cholecalciferol  5,000 Units Oral Daily  . citalopram  20 mg Oral QHS  . diltiazem  60 mg Oral 4 times per day  . docusate sodium  100 mg Oral BID  . Influenza vac split quadrivalent PF  0.5 mL Intramuscular Tomorrow-1000  . insulin aspart  0-15 Units Subcutaneous TID WC  . insulin aspart  0-5 Units Subcutaneous QHS  . omega-3 acid ethyl esters   Oral Weekly  . pantoprazole  40 mg Oral Daily  . sodium chloride  3 mL  Intravenous Q12H  . spironolactone  12.5 mg Oral Daily  . [START ON 03/28/2015] Warfarin - Pharmacist Dosing Inpatient   Does not apply Q24H   Continuous Infusions: . furosemide (LASIX) infusion 3 mg/hr (03/27/15 0701)   Assessment and plan:  Principal Problem:   Atrial fibrillation (HCC) Active Problems:   Atrial fibrillation with rapid ventricular response (HCC)   Diastolic CHF, acute on chronic (HCC)   CKD (chronic kidney disease), stage IV (HCC)   Nonalcoholic fatty liver disease   Supratherapeutic INR   Diabetes mellitus with nephropathy ([place])   OBESITY   Essential hypertension   Long term (current) use of anticoagulants   Hematuria, gross   Acute blood loss anemia   UTI (lower urinary tract infection)    1. Acute on chronic diastolic congestive heart failure with superimposed right heart dysfunction and pulmonary hypertension. On admission, on exam, the patient had significant volume overload. She admitted to dietary noncompliance. Patient was started on IV Lasix. Cardiology was consulted and started the patient on a Lasix drip given that she has not had significant urine output/diuresis. She is being continued on spironolactone. -I/O -832, but overall  still positive.  Chronic atrial fibrillation with RVR on admission. The patient is treated chronically with verapamil and Coumadin. Her INR was supratherapeutic at 9.81. It was held. Verapamil was held and she was started on a diltiazem drip. Cardiology discontinued the diltiazem drip on 10/12 and started her on oral diltiazem. Echocardiogram results noted above. Her TSH was within normal limits. -Coumadin is being restarted and dosed per pharmacy. Patient's INR is currently 3.22.  Warfarin coagulopathy/supratherapeutic INR The patient is treated with warfarin for chronic age fibrillation. Her INR was supratherapeutic at 9.81. It looks like she may have mild hematuria. Her hemoglobin has drifted down from 11.8 on admission  to 9.4. Her INR has decreased to therapeutic range. -Coumadin restarted with pharmacy dosing.  Anemia, likely secondary to mild acute blood loss in the setting of anticoagulation. Patient's hemoglobin was 11.8 on admission and has drifted down to 9.4. She denies bright red blood per rectum or melena, but she has some hematuria. -No need for packed red blood cell transfusion. We'll continue to monitor her H/H. -We'll at H2 blocker empirically.  Hyponatremia. Patient's serum sodium was 131 on admission. This is presumed to be secondary to volume overload. Her serum sodium has improved slightly on diuretic therapy. We'll continue to monitor.  Chronic kidney disease, stage 3 to stage IV. Patient's creatinine was 1.62 admission. It has improved to 1.48. Continue to monitor.  Diabetes mellitus with nephropathy. Patient is treated chronically with Humalog each morning; it is being held and she is being treated with sliding scale NovoLog area Her CBGs have been reasonable.  UTI. Patient's urinalysis revealed many bacteria, WBCs, and RBCs. Will start Rocephin empirically. Will culture the urine.  Nonalcoholic fatty liver disease. She has hyperbilirubinemia, but no significant transaminitis. Abdominal ultrasound on 10/11 revealed small amount of ascites in the right lower quadrant. Will continue spironolactone. Will order an ammonia level tomorrow morning.   Time spent: 35 minutes.    [facility] Pager [phone]. If 7PM-7AM, please contact night-coverage at [email or web address], password [facility] 03/27/2015, 9:50 AM  LOS: 3 days

## 2015-03-27 NOTE — Progress Notes (Signed)
ERROR
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
## 2015-03-28 DIAGNOSIS — N390 Urinary tract infection, site not specified: Secondary | ICD-10-CM

## 2015-03-28 LAB — GLUCOSE, CAPILLARY
GLUCOSE-CAPILLARY: 139 mg/dL — AB (ref 65–99)
GLUCOSE-CAPILLARY: 149 mg/dL — AB (ref 65–99)
Glucose-Capillary: 149 mg/dL — ABNORMAL HIGH (ref 65–99)
Glucose-Capillary: 156 mg/dL — ABNORMAL HIGH (ref 65–99)

## 2015-03-28 LAB — BASIC METABOLIC PANEL
ANION GAP: 10 (ref 5–15)
BUN: 32 mg/dL — ABNORMAL HIGH (ref 6–20)
CHLORIDE: 97 mmol/L — AB (ref 101–111)
CO2: 24 mmol/L (ref 22–32)
Calcium: 8.8 mg/dL — ABNORMAL LOW (ref 8.9–10.3)
Creatinine, Ser: 1.42 mg/dL — ABNORMAL HIGH (ref 0.44–1.00)
GFR calc Af Amer: 43 mL/min — ABNORMAL LOW (ref >60)
GFR calc non Af Amer: 37 mL/min — ABNORMAL LOW (ref >60)
GLUCOSE: 156 mg/dL — AB (ref 65–99)
Potassium: 3.2 mmol/L — ABNORMAL LOW (ref 3.5–5.1)
Sodium: 131 mmol/L — ABNORMAL LOW (ref 135–145)

## 2015-03-28 LAB — CBC
HEMATOCRIT: 30 % — AB (ref 36.0–46.0)
Hemoglobin: 9.5 g/dL — ABNORMAL LOW (ref 12.0–15.0)
MCH: 26.7 pg (ref 26.0–34.0)
MCHC: 31.7 g/dL (ref 30.0–36.0)
MCV: 84.3 fL (ref 78.0–100.0)
Platelets: 237 10*3/uL (ref 150–400)
RBC: 3.56 MIL/uL — AB (ref 3.87–5.11)
RDW: 17.1 % — ABNORMAL HIGH (ref 11.5–15.5)
WBC: 10 10*3/uL (ref 4.0–10.5)

## 2015-03-28 LAB — PROTIME-INR
INR: 3.82 — ABNORMAL HIGH (ref 0.00–1.49)
Prothrombin Time: 36.7 seconds — ABNORMAL HIGH (ref 11.6–15.2)

## 2015-03-28 LAB — AMMONIA: AMMONIA: 26 umol/L (ref 9–35)

## 2015-03-28 MED ORDER — SPIRONOLACTONE 25 MG PO TABS
25.0000 mg | ORAL_TABLET | Freq: Every day | ORAL | Status: DC
  Administered 2015-03-28 – 2015-04-05 (×9): 25 mg via ORAL
  Filled 2015-03-28 (×9): qty 1

## 2015-03-28 MED ORDER — POTASSIUM CHLORIDE CRYS ER 20 MEQ PO TBCR
20.0000 meq | EXTENDED_RELEASE_TABLET | Freq: Three times a day (TID) | ORAL | Status: AC
  Administered 2015-03-28 – 2015-03-29 (×5): 20 meq via ORAL
  Filled 2015-03-28 (×6): qty 1

## 2015-03-28 NOTE — Progress Notes (Signed)
[facility] PROGRESS NOTE  MELLONIE GUESS OLM:[id number] DOB: [birth year] DOA: 03/30/2015 PCP: Purvis Kilts, MD    Code Status: Full code Family Communication: Discussed with husband Disposition Plan: Discharge  when clinically appropriate   Consultants:  Cardiology  Procedures: Echocardiogram 03/25/2015: Study Conclusions  - Left ventricle: The cavity size was normal. Wall thickness was increased in a pattern of mild LVH. Systolic function was normal. The estimated ejection fraction was in the range of 60% to 65%. Wall motion was normal; there were no regional wall motion abnormalities. The study is not technically sufficient to allow evaluation of LV diastolic function. - Ventricular septum: The contour showed diastolic flattening and systolic flattening. - Aortic valve: Mildly calcified annulus. Trileaflet. - Mitral valve: Calcified annulus. There was trivial regurgitation. - Left atrium: The atrium was mildly dilated. - Right ventricle: The cavity size was severely dilated. Systolic function was reduced - difficult to assess. - Right atrium: The atrium was mildly dilated. - Tricuspid valve: There was moderate regurgitation. Peak RV-RA gradient (S): 65 mm Hg. - Pulmonary arteries: Systolic pressure could not be accurately estimated. - Pericardium, extracardiac: There was no pericardial effusion. Impressions: - Mild LVH with LVEF 60-65%, indeterminate diastolic function in the setting of atrial fibrillation. Septal flatening consistent with RV pressure and volume overload. Mild biatrial enlargement. Mildly sclerotic aortic valve. Severe RV dilatation with somewhat reduced contraction - difficult to assess. Moderate tricuspid regurgitation with suspected severe pulmonary hypertension, RV-RA gradient 65 mmHg. Unable to assess CVP.  Antibiotics:   Rocephin 10/13>>  HPI/Subjective: Patient says that she is breathing a  little better. She wants to try to get out of bed as she feels weak generally.  Objective: Filed Vitals:   03/28/15 0800  BP: 140/66  Pulse: 104  Temp:   Resp: 14   oxygen saturation 97%. Temperature 98.5.  Intake/Output Summary (Last 24 hours) at 03/28/15 1000 Last data filed at 03/28/15 0522  Gross per 24 hour  Intake 471.16 ml  Output   2000 ml  Net -1528.84 ml   Filed Weights   03/26/15 0500 03/27/15 0500 03/28/15 0500  Weight: 145.4 kg (320 lb 8.8 oz) 146.9 kg (323 lb 13.7 oz) 146 kg (321 lb 14 oz)    Exam:   General:  Obese ill-appearing 69 year old woman in no acute distress.  Cardiovascular: Irregular, irregular with borderline tachycardia.  Respiratory: Decreased breath sounds in the bases; clear lungs anteriorly.  Abdomen: Obese, positive bowel sounds, minimal hypogastrium tenderness; nondistended.  GU: Dark yellow colored urine in the Foley catheter bag again noted.  Musculoskeletal/extremities: 1+ nonpitting edema.  Neurologic/psychiatric: Flat affect, but alert and oriented 2. Cranial nerves II through XII are intact.   Data Reviewed: Basic Metabolic Panel:  Recent Labs Lab 03/17/2015 1954 03/25/15 0456 03/26/15 0527 03/27/15 0455 03/28/15 0527  NA 131* 131* 131* 132* 131*  K 3.8 3.8 3.6 3.5 3.2*  CL 94* 98* 96* 96* 97*  CO2 23 22 24 23 24   GLUCOSE 131* 134* 138* 141* 156*  BUN 28* 30* 32* 33* 32*  CREATININE 1.59* 1.62* 1.59* 1.48* 1.42*  CALCIUM 8.7* 8.4* 8.9 9.1 8.8*   Liver Function Tests:  Recent Labs Lab 03/25/2015 1954  AST 25  ALT 6*  ALKPHOS 143*  BILITOT 5.5*  PROT 7.8  ALBUMIN 3.1*    Recent Labs Lab 04/01/2015 1954  LIPASE 22    Recent Labs Lab 04/02/2015 2325 03/28/15 0527  AMMONIA 18 26   CBC:  Recent Labs Lab 04/04/2015  1954 03/25/15 0456 03/26/15 0527 03/28/15 0527  WBC 15.7* 12.7* 11.2* 10.0  NEUTROABS 13.5*  --   --   --   HGB 11.8* 10.0* 9.4* 9.5*  HCT 36.0 31.5* 29.0* 30.0*  MCV 85.7 84.9 83.8 84.3   PLT 281 247 229 237   Cardiac Enzymes:  Recent Labs Lab 03/22/2015 1954  TROPONINI <0.03   BNP (last 3 results)  Recent Labs  03/28/2015 1954  BNP 422.0*    ProBNP (last 3 results) No results for input(s): PROBNP in the last 8760 hours.  CBG:  Recent Labs Lab 03/27/15 0725 03/27/15 1150 03/27/15 1600 03/27/15 2107 03/28/15 0742  GLUCAP 127* 128* 143* 138* 139*    Recent Results (from the past 240 hour(s))  MRSA PCR Screening     Status: None   Collection Time: 03/18/2015 11:25 PM  Result Value Ref Range Status   MRSA by PCR NEGATIVE NEGATIVE Final    Comment:        The GeneXpert MRSA Assay (FDA approved for NASAL specimens only), is one component of a comprehensive MRSA colonization surveillance program. It is not intended to diagnose MRSA infection nor to guide or monitor treatment for MRSA infections.   Culture, Urine     Status: None (Preliminary result)   Collection Time: 03/27/15  1:15 PM  Result Value Ref Range Status   Specimen Description URINE, CATHETERIZED  Final   Special Requests NONE  Final   Culture   Final    NO GROWTH < 12 HOURS Performed at [facility]    Report Status PENDING  Incomplete     Studies: No results found.  Scheduled Meds: . antiseptic oral rinse  7 mL Mouth Rinse BID  . atorvastatin  20 mg Oral QHS  . cholecalciferol  5,000 Units Oral Daily  . citalopram  20 mg Oral QHS  . diltiazem  60 mg Oral 4 times per day  . famotidine  20 mg Oral Daily  . Influenza vac split quadrivalent PF  0.5 mL Intramuscular Tomorrow-1000  . insulin aspart  0-15 Units Subcutaneous TID WC  . insulin aspart  0-5 Units Subcutaneous QHS  . levalbuterol  0.63 mg Nebulization TID  . omega-3 acid ethyl esters   Oral Weekly  . pantoprazole  40 mg Oral Daily  . senna-docusate  2 tablet Oral QHS  . sodium chloride  3 mL Intravenous Q12H  . spironolactone  25 mg Oral Daily  . Warfarin - Pharmacist Dosing Inpatient   Does not apply Q24H    Continuous Infusions: . furosemide (LASIX) infusion 8 mg/hr (03/28/15 0909)   Assessment and plan:  Principal Problem:   Atrial fibrillation (HCC) Active Problems:   Atrial fibrillation with rapid ventricular response (HCC)   Diastolic CHF, acute on chronic (HCC)   CKD (chronic kidney disease), stage IV (HCC)   Nonalcoholic fatty liver disease   Supratherapeutic INR   Diabetes mellitus with nephropathy ([place])   OBESITY   Essential hypertension   Long term (current) use of anticoagulants   Hematuria, gross   Acute blood loss anemia   UTI (lower urinary tract infection)   NASH (nonalcoholic steatohepatitis)    1. Acute on chronic diastolic congestive heart failure with superimposed right heart dysfunction and pulmonary hypertension. On admission, on exam, the patient had significant volume overload. She admitted to dietary noncompliance. Patient was started on IV Lasix. Cardiology was consulted and started the patient on a Lasix drip given that she has not had significant  urine output/diuresis for the first 24 hours. She is being continued on spironolactone. -I/O improving: -1.1 L -Cardiology increased Lasix drip to 8 mg per hour. Chronic atrial fibrillation with RVR on admission. The patient is treated chronically with verapamil and Coumadin. Her INR was supratherapeutic at 9.81. It was held. Verapamil was held and she was started on a diltiazem drip. Cardiology discontinued the diltiazem drip on 10/12 and started her on oral diltiazem. Echocardiogram results noted above. Her TSH was within normal limits. -Her INR improved and Coumadin was restarted and dosed per pharmacy. Warfarin coagulopathy/supratherapeutic INR The patient is treated with warfarin for chronic atrial fibrillation. Her INR was supratherapeutic at 9.81. It looks like she may have mild hematuria. Her hemoglobin has drifted down from 11.8 on admission to 9.4. Her INR has decreased to therapeutic range. -Her  hemoglobin has been stable over the past 48 hours. -Coumadin restarted with pharmacy dosing. Anemia, likely secondary to mild acute blood loss in the setting of anticoagulation. Patient's hemoglobin was 11.8 on admission and has drifted down to 9.4. She denies bright red blood per rectum or melena, but she had some hematuria. -No need for packed red blood cell transfusion. We'll continue to monitor her H/H, which has been stable over the past 48 hours. Pepcid started empirically. Hyponatremia. Patient's serum sodium was 131 on admission. This is presumed to be secondary to volume overload. Her serum sodium had improved slightly on diuretic therapy. We'll continue to monitor. Chronic kidney disease, stage 3 to stage IV. Patient's creatinine was 1.62 admission. It has improved to 1.42. Continue to monitor. Diabetes mellitus with nephropathy. Patient is treated chronically with Humalog each morning; it is being held and she is being treated with sliding scale NovoLog. Her CBGs have been reasonable. UTI. Patient's urinalysis revealed many bacteria, WBCs, and RBCs. Rocephin started empirically on 10/13. Urine culture ordered and pending.  Nonalcoholic fatty liver disease. She has hyperbilirubinemia, but no significant transaminitis. Abdominal ultrasound on 10/11 revealed small amount of ascites in the right lower quadrant. Will continue spironolactone. -Her ammonia level was 26.   We'll transfer out of the ICU to telemetry. Will order PT consult and out of bed to the chair.   Time spent: 35 minutes.    [facility] Pager [phone]. If 7PM-7AM, please contact night-coverage at [email or web address], password [email or web address] 03/28/2015, 10:00 AM  LOS: 4 days

## 2015-03-28 NOTE — Progress Notes (Signed)
Arrived to room from ICU via bed, Lassix drip infusing at 79ml/hour in left forearm, saline lock in right forearm.  Oxygen intact acute, SOB with slight exertion.  Telemetry #19 placed, central telemetry notified.  Husband in room.  Pitting edema to lower extremities, abdomen distended.  Will continue to monitor

## 2015-03-28 NOTE — Progress Notes (Signed)
Primary cardiologist: Dr. Kate Sable  Seen for followup: CHF, atrial fibrillation  Subjective:    Week but generally improving in terms of edema. No abdominal pain. No palpitations.  Objective:   Temp:  [97.9 F (36.6 C)-98.8 F (37.1 C)] 98.5 F (36.9 C) (10/14 0400) Pulse Rate:  [92-115] 104 (10/14 0800) Resp:  [14-27] 14 (10/14 0800) BP: (116-155)/(55-118) 140/66 mmHg (10/14 0800) SpO2:  [94 %-99 %] 97 % (10/14 0800) Weight:  [321 lb 14 oz (146 kg)] 321 lb 14 oz (146 kg) (10/14 0500) Last BM Date: 03/27/15  Filed Weights   03/26/15 0500 03/27/15 0500 03/28/15 0500  Weight: 320 lb 8.8 oz (145.4 kg) 323 lb 13.7 oz (146.9 kg) 321 lb 14 oz (146 kg)    Intake/Output Summary (Last 24 hours) at 03/28/15 0902 Last data filed at 03/28/15 0522  Gross per 24 hour  Intake 471.16 ml  Output   2000 ml  Net -1528.84 ml    Telemetry: Atrial fibrillation around 100 bpm.  Exam:  General: Morbidly obese, no distress.  Lungs: Decreased breath sounds.  Cardiac: Elevated JVP. Irregularly irregular without gallop.  Abdomen: Obese, protuberant. NABS.  Extremities: Chronic appearing edema.  Lab Results:  Basic Metabolic Panel:  Recent Labs Lab 03/26/15 0527 03/27/15 0455 03/28/15 0527  NA 131* 132* 131*  K 3.6 3.5 3.2*  CL 96* 96* 97*  CO2 24 23 24   GLUCOSE 138* 141* 156*  BUN 32* 33* 32*  CREATININE 1.59* 1.48* 1.42*  CALCIUM 8.9 9.1 8.8*    Liver Function Tests:  Recent Labs Lab 03/18/2015 1954  AST 25  ALT 6*  ALKPHOS 143*  BILITOT 5.5*  PROT 7.8  ALBUMIN 3.1*    CBC:  Recent Labs Lab 03/25/15 0456 03/26/15 0527 03/28/15 0527  WBC 12.7* 11.2* 10.0  HGB 10.0* 9.4* 9.5*  HCT 31.5* 29.0* 30.0*  MCV 84.9 83.8 84.3  PLT 247 229 237    Cardiac Enzymes:  Recent Labs Lab 04/07/2015 1954  TROPONINI <0.03    Coagulation:  Recent Labs Lab 03/26/15 0527 03/27/15 0455 03/28/15 0527  INR 2.43* 3.22* 3.82*    Echocardiogram  03/25/2015: Study Conclusions  - Left ventricle: The cavity size was normal. Wall thickness was increased in a pattern of mild LVH. Systolic function was normal. The estimated ejection fraction was in the range of 60% to 65%. Wall motion was normal; there were no regional wall motion abnormalities. The study is not technically sufficient to allow evaluation of LV diastolic function. - Ventricular septum: The contour showed diastolic flattening and systolic flattening. - Aortic valve: Mildly calcified annulus. Trileaflet. - Mitral valve: Calcified annulus. There was trivial regurgitation. - Left atrium: The atrium was mildly dilated. - Right ventricle: The cavity size was severely dilated. Systolic function was reduced - difficult to assess. - Right atrium: The atrium was mildly dilated. - Tricuspid valve: There was moderate regurgitation. Peak RV-RA gradient (S): 65 mm Hg. - Pulmonary arteries: Systolic pressure could not be accurately estimated. - Pericardium, extracardiac: There was no pericardial effusion.  Impressions:  - Mild LVH with LVEF 60-65%, indeterminate diastolic function in the setting of atrial fibrillation. Septal flatening consistent with RV pressure and volume overload. Mild biatrial enlargement. Mildly sclerotic aortic valve. Severe RV dilatation with somewhat reduced contraction - difficult to assess. Moderate tricuspid regurgitation with suspected severe pulmonary hypertension, RV-RA gradient 65 mmHg. Unable to assess CVP.  Abdominal ultrasound 03/25/2015: TECHNIQUE: Limited ultrasound survey for ascites was performed in all four  abdominal quadrants.  COMPARISON: None.  FINDINGS: Small amount of ascites right lower quadrant. Otherwise negative.  IMPRESSION: Small amount of ascites right lower quadrant.   Medications:   Scheduled Medications: . antiseptic oral rinse  7 mL Mouth Rinse BID  . atorvastatin  20 mg Oral  QHS  . cholecalciferol  5,000 Units Oral Daily  . citalopram  20 mg Oral QHS  . diltiazem  60 mg Oral 4 times per day  . famotidine  20 mg Oral Daily  . Influenza vac split quadrivalent PF  0.5 mL Intramuscular Tomorrow-1000  . insulin aspart  0-15 Units Subcutaneous TID WC  . insulin aspart  0-5 Units Subcutaneous QHS  . levalbuterol  0.63 mg Nebulization TID  . omega-3 acid ethyl esters   Oral Weekly  . pantoprazole  40 mg Oral Daily  . senna-docusate  2 tablet Oral QHS  . sodium chloride  3 mL Intravenous Q12H  . spironolactone  12.5 mg Oral Daily  . Warfarin - Pharmacist Dosing Inpatient   Does not apply Q24H    Infusions: . furosemide (LASIX) infusion 5 mg/hr (03/27/15 1020)     Assessment:   1. Volume overload with suspected diastolic heart failure and component of RV dysfunction with pulmonary hypertension. Approximately 1100 cc out more than and yesterday, shows progress with Lasix GTT. Still has a long way to go however.  2. Chronic atrial fibrillation. She is been followed by pharmacy. Coumadin held initially with supratherapeutic INR. She has been converted to oral diltiazem.  3. CKD stage 3. Creatinine down to 1.4 and stable on Lasix GTT.  4. Anemia, hemoglobin down to 9.5 from 11.8.  5. UTI, management per primary team.   Plan/Discussion:    Increase Lasix drip to 8 mg/h, and aldactone to 25 mg daily. No change in oral diltiazem for now. Follow BMET and UOP. May need standing potassium supplement. Can likely transfer to telemetry.   Satira Sark, M.D., F.A.C.C.
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
## 2015-03-28 NOTE — Progress Notes (Signed)
Report called to [facility] on 300. Patient ready for transport.

## 2015-03-28 NOTE — Evaluation (Signed)
Physical Therapy Evaluation Patient Details Name: Krystal [name] MRN: [id number] DOB: 20-Oct-1945 Today's Date: 03/28/2015   History of Present Illness  Krystal [name] is an 69 y.o. female with hx of obesity, known afib since 2012, on supratherapeutic Coumadin with INR of 9.81, not bleeding, hx of abnormal nuclear stress [name] (question of balanced multivessel ischemia), culminated in LHC last month with EF 55 %, and no obstructive coronaries, hx of prior disastolic CHF, DM, HTN, severe non compliance and hadn't taken her medications for " weeks", hx of NASH (Avandia induced cirrhosis), presented to the ER with several weeks of SOB, DOE, worse this past week, and found to be in afib with RVR. Her CXR showed questionable infiltrate, and her EKG showed afib with HR 140. Her troponin was negative. She had a WBC of 15K, and her lactic acid was slightly elevated. Hospitalist was asked to admit her for afib with RVR. She denied fever, coughs, chest pain, orthopnea, PND. She felt that her abdomen was a little larger when asked. It was not painful.  Pt lives with her husband who states that pt has been in a decline over the past month, especially and had gotten to the point where she was nearly bed bound.  Clinical Impression   Pt was seen for evaluation.  She is morbidly obese with a great deal of edema present.  She is currently on supplemental O2 at 2 L/min.  She was found to have profound weakness and limited mobility due to illness, fluid retention and obesity.  She required mod assist with HOB elevated to transfer to EOB and max assist to stand to a walker.  She had significant instability in both knees in stance due to LE weakness and she then needed max assist to transfer to a chair using the walker.  She is unable to ambulate at this time.  She would benefit from SNF at d/c.  I do not believe that her husband could manage her at home until she regains some strength.  Pt is agreeable to this.   Follow Up Recommendations SNF    Equipment Recommendations  None recommended by PT    Recommendations for Other Services   OT    Precautions / Restrictions Precautions Precautions: Fall Restrictions Weight Bearing Restrictions: No      Mobility  Bed Mobility Overal bed mobility: Needs Assistance Bed Mobility: Supine to Sit     Supine to sit: HOB elevated;Mod assist     General bed mobility comments: HOB fully elevated, pt is able to assist in scooting her buttocks to EOB  Transfers Overall transfer level: Needs assistance Equipment used: Rolling walker (2 wheeled) Transfers: Sit to/from [employer] Transfers Sit to Stand: Max assist;From elevated surface Stand pivot transfers: Max assist       General transfer comment: pt used the walker to pivot bed to chair but this was extremely slow and labored,,,her knees tend to buckle in stance and gait was impossible.  Ambulation/Gait    unable                                  Balance Overall balance assessment:  (sitting balance is good, unable to [name] standing balance)  Pertinent Vitals/Pain Pain Assessment: No/denies pain    Home Living Family/patient expects to be discharged to:: Skilled nursing facility Living Arrangements: Spouse/significant other                    Prior Function Level of Independence: Needs assistance   Gait / Transfers Assistance Needed: pt has been minimally able to ambulate for the past month, using a walker...needed assist to transfer out of bed.Marland KitchenMarland KitchenMarland KitchenMarland Kitchenprior to that she was fairly independent with gait using a walker  ADL's / Homemaking Assistance Needed: assist with all ADLs for at least the past month...husband states that she has been in a decline over the past several months        Hand Dominance   Dominant Hand: Right    Extremity/Trunk Assessment   Upper Extremity Assessment:  Generalized weakness           Lower Extremity Assessment: Generalized weakness (pt has severe edemal of LEs....LLE is weaker than RLE (LLE=2/5, RLE=3/5))      Cervical / Trunk Assessment: Kyphotic  Communication   Communication: No difficulties  Cognition Arousal/Alertness: Awake/alert Behavior During Therapy: WFL for tasks assessed/performed Overall Cognitive Status: Within Functional Limits for tasks assessed                                    Assessment/Plan    PT Assessment Patient needs continued PT services  PT Diagnosis Difficulty walking;Generalized weakness   PT Problem List Decreased strength;Decreased activity tolerance;Decreased mobility;Obesity;Cardiopulmonary status limiting activity  PT Treatment Interventions Functional mobility training;Therapeutic exercise   PT Goals (Current goals can be found in the Care Plan section) Acute Rehab PT Goals Patient Stated Goal: none stated PT Goal Formulation: With patient Time For Goal Achievement: 04/11/15 Potential to Achieve Goals: Fair    Frequency Min 3X/week   Barriers to discharge   none                   End of Session Equipment Utilized During Treatment: Gait belt;Oxygen Activity Tolerance: Patient tolerated treatment well Patient left: in chair;with call bell/phone within reach Nurse Communication: Mobility status;Need for lift equipment (maxi move sling placed in chair to assist staff in returning pt to bed)         Time: 1450-1600 PT Time Calculation (min) (ACUTE ONLY): 70 min   Charges:   PT Evaluation $Initial PT Evaluation Tier I: 1 Procedure PT Treatments $Therapeutic Activity: 23-37 mins   PT G CodesSable Feil  PT 03/28/2015, 5:10 PM [phone]

## 2015-03-29 DIAGNOSIS — R310 Gross hematuria: Secondary | ICD-10-CM

## 2015-03-29 LAB — PROTIME-INR
INR: 4.52 — AB (ref 0.00–1.49)
PROTHROMBIN TIME: 41.6 s — AB (ref 11.6–15.2)

## 2015-03-29 LAB — URINE CULTURE: CULTURE: NO GROWTH

## 2015-03-29 LAB — BASIC METABOLIC PANEL
ANION GAP: 9 (ref 5–15)
BUN: 29 mg/dL — ABNORMAL HIGH (ref 6–20)
CO2: 26 mmol/L (ref 22–32)
Calcium: 8.8 mg/dL — ABNORMAL LOW (ref 8.9–10.3)
Chloride: 97 mmol/L — ABNORMAL LOW (ref 101–111)
Creatinine, Ser: 1.23 mg/dL — ABNORMAL HIGH (ref 0.44–1.00)
GFR calc Af Amer: 51 mL/min — ABNORMAL LOW (ref >60)
GFR, EST NON AFRICAN AMERICAN: 44 mL/min — AB (ref >60)
GLUCOSE: 156 mg/dL — AB (ref 65–99)
POTASSIUM: 3.3 mmol/L — AB (ref 3.5–5.1)
Sodium: 132 mmol/L — ABNORMAL LOW (ref 135–145)

## 2015-03-29 LAB — GLUCOSE, CAPILLARY
GLUCOSE-CAPILLARY: 161 mg/dL — AB (ref 65–99)
Glucose-Capillary: 156 mg/dL — ABNORMAL HIGH (ref 65–99)
Glucose-Capillary: 165 mg/dL — ABNORMAL HIGH (ref 65–99)

## 2015-03-29 MED ORDER — POTASSIUM CHLORIDE CRYS ER 20 MEQ PO TBCR
40.0000 meq | EXTENDED_RELEASE_TABLET | ORAL | Status: AC
  Administered 2015-03-29 (×2): 40 meq via ORAL
  Filled 2015-03-29 (×2): qty 2

## 2015-03-29 MED ORDER — FUROSEMIDE 10 MG/ML IJ SOLN
INTRAMUSCULAR | Status: AC
  Filled 2015-03-29: qty 30

## 2015-03-29 NOTE — Progress Notes (Signed)
[facility] PROGRESS NOTE  YUMA BLUCHER UDJ:[id number] DOB: 1946-06-11 DOA: 04/02/2015 PCP: Purvis Kilts, MD    Code Status: Full code Family Communication: Discussed with husband Disposition Plan: Discharge  when clinically appropriate   Consultants:  Cardiology  Procedures: Echocardiogram 03/25/2015: Study Conclusions  - Left ventricle: The cavity size was normal. Wall thickness was increased in a pattern of mild LVH. Systolic function was normal. The estimated ejection fraction was in the range of 60% to 65%. Wall motion was normal; there were no regional wall motion abnormalities. The study is not technically sufficient to allow evaluation of LV diastolic function. - Ventricular septum: The contour showed diastolic flattening and systolic flattening. - Aortic valve: Mildly calcified annulus. Trileaflet. - Mitral valve: Calcified annulus. There was trivial regurgitation. - Left atrium: The atrium was mildly dilated. - Right ventricle: The cavity size was severely dilated. Systolic function was reduced - difficult to assess. - Right atrium: The atrium was mildly dilated. - Tricuspid valve: There was moderate regurgitation. Peak RV-RA gradient (S): 65 mm Hg. - Pulmonary arteries: Systolic pressure could not be accurately estimated. - Pericardium, extracardiac: There was no pericardial effusion. Impressions: - Mild LVH with LVEF 60-65%, indeterminate diastolic function in the setting of atrial fibrillation. Septal flatening consistent with RV pressure and volume overload. Mild biatrial enlargement. Mildly sclerotic aortic valve. Severe RV dilatation with somewhat reduced contraction - difficult to assess. Moderate tricuspid regurgitation with suspected severe pulmonary hypertension, RV-RA gradient 65 mmHg. Unable to assess CVP.  Antibiotics:   Rocephin 10/13>>10/15  HPI/Subjective: Feels weak. Shortness of breath is  improving. No chest pain.  Objective: Filed Vitals:   03/29/15 0508  BP: 140/68  Pulse: 98  Temp: 97.8 F (36.6 C)  Resp: 16     Intake/Output Summary (Last 24 hours) at 03/29/15 1029 Last data filed at 03/29/15 0939  Gross per 24 hour  Intake    843 ml  Output   1850 ml  Net  -1007 ml   Filed Weights   03/26/15 0500 03/27/15 0500 03/28/15 0500  Weight: 145.4 kg (320 lb 8.8 oz) 146.9 kg (323 lb 13.7 oz) 146 kg (321 lb 14 oz)    Exam:   General:  Obese ill-appearing 69 year old woman with mild increase work of breathing.  Cardiovascular: Irregular, irregular   Respiratory: Decreased breath sounds in the bases; crackles at bases.  Abdomen: Obese, positive bowel sounds, nontender; nondistended.  GU: Dark yellow colored urine in the Foley catheter bag again noted.  Musculoskeletal/extremities: 1+ nonpitting edema.  Data Reviewed: Basic Metabolic Panel:  Recent Labs Lab 03/25/15 0456 03/26/15 0527 03/27/15 0455 03/28/15 0527 03/29/15 0602  NA 131* 131* 132* 131* 132*  K 3.8 3.6 3.5 3.2* 3.3*  CL 98* 96* 96* 97* 97*  CO2 22 24 23 24 26   GLUCOSE 134* 138* 141* 156* 156*  BUN 30* 32* 33* 32* 29*  CREATININE 1.62* 1.59* 1.48* 1.42* 1.23*  CALCIUM 8.4* 8.9 9.1 8.8* 8.8*   Liver Function Tests:  Recent Labs Lab 04/02/2015 1954  AST 25  ALT 6*  ALKPHOS 143*  BILITOT 5.5*  PROT 7.8  ALBUMIN 3.1*    Recent Labs Lab 04/13/2015 1954  LIPASE 22    Recent Labs Lab 04/07/2015 2325 03/28/15 0527  AMMONIA 18 26   CBC:  Recent Labs Lab 04/05/2015 1954 03/25/15 0456 03/26/15 0527 03/28/15 0527  WBC 15.7* 12.7* 11.2* 10.0  NEUTROABS 13.5*  --   --   --   HGB 11.8* 10.0*  9.4* 9.5*  HCT 36.0 31.5* 29.0* 30.0*  MCV 85.7 84.9 83.8 84.3  PLT 281 247 229 237   Cardiac Enzymes:  Recent Labs Lab 03/23/2015 1954  TROPONINI <0.03   BNP (last 3 results)  Recent Labs  03/25/2015 1954  BNP 422.0*    ProBNP (last 3 results) No results for input(s):  PROBNP in the last 8760 hours.  CBG:  Recent Labs Lab 03/28/15 0742 03/28/15 1104 03/28/15 1624 03/28/15 2113 03/29/15 0724  GLUCAP 139* 149* 149* 156* 156*    Recent Results (from the past 240 hour(s))  MRSA PCR Screening     Status: None   Collection Time: 04/03/2015 11:25 PM  Result Value Ref Range Status   MRSA by PCR NEGATIVE NEGATIVE Final    Comment:        The GeneXpert MRSA Assay (FDA approved for NASAL specimens only), is one component of a comprehensive MRSA colonization surveillance program. It is not intended to diagnose MRSA infection nor to guide or monitor treatment for MRSA infections.   Culture, Urine     Status: None   Collection Time: 03/27/15  1:15 PM  Result Value Ref Range Status   Specimen Description URINE, CATHETERIZED  Final   Special Requests NONE  Final   Culture   Final    NO GROWTH 2 DAYS Performed at [facility]    Report Status 03/29/2015 FINAL  Final     Studies: No results found.  Scheduled Meds: . antiseptic oral rinse  7 mL Mouth Rinse BID  . atorvastatin  20 mg Oral QHS  . cholecalciferol  5,000 Units Oral Daily  . citalopram  20 mg Oral QHS  . diltiazem  60 mg Oral 4 times per day  . famotidine  20 mg Oral Daily  . Influenza vac split quadrivalent PF  0.5 mL Intramuscular Tomorrow-1000  . insulin aspart  0-15 Units Subcutaneous TID WC  . insulin aspart  0-5 Units Subcutaneous QHS  . levalbuterol  0.63 mg Nebulization TID  . omega-3 acid ethyl esters   Oral Weekly  . pantoprazole  40 mg Oral Daily  . potassium chloride  20 mEq Oral TID  . potassium chloride  40 mEq Oral Q3H  . senna-docusate  2 tablet Oral QHS  . sodium chloride  3 mL Intravenous Q12H  . spironolactone  25 mg Oral Daily  . Warfarin - Pharmacist Dosing Inpatient   Does not apply Q24H   Continuous Infusions: . furosemide (LASIX) infusion 8 mg/hr (03/28/15 1655)   Assessment and plan:  Principal Problem:   Atrial fibrillation ([place]) Active  Problems:   Diabetes mellitus with nephropathy ([facility])   OBESITY   Essential hypertension   Diastolic CHF, acute on chronic ([facility])   Long term (current) use of anticoagulants   CKD (chronic kidney disease), stage IV ([facility])   Nonalcoholic fatty liver disease   Atrial fibrillation with rapid ventricular response ([facility])   Hematuria, gross   Acute blood loss anemia   Supratherapeutic INR   UTI (lower urinary tract infection)   NASH (nonalcoholic steatohepatitis)    1. Acute on chronic diastolic congestive heart failure with superimposed right heart dysfunction and pulmonary hypertension. On admission, on exam, the patient had significant volume overload. She admitted to dietary noncompliance. Patient was started on IV Lasix. Cardiology was consulted and started the patient on a Lasix drip.  She is being continued on spironolactone. -urine output is improving. She made 2.5L of urine yesterday -She is currently  on  Lasix drip to 8 mg per hour. Chronic atrial fibrillation with RVR on admission. The patient is treated chronically with verapamil and Coumadin. Her INR was supratherapeutic at 9.81. It was held. Verapamil was held and she was started on a diltiazem drip. Cardiology discontinued the diltiazem drip on 10/12 and started her on oral diltiazem. Echocardiogram results noted above. Her TSH was within normal limits. -Her INR improved and Coumadin was restarted and dosed per pharmacy. Warfarin coagulopathy/supratherapeutic INR The patient is treated with warfarin for chronic atrial fibrillation. Her INR was supratherapeutic at 9.81. It looks like she may have mild hematuria. Her hemoglobin has drifted down from 11.8 on admission to 9.4. Her INR has decreased to therapeutic range. -Her hemoglobin has been stable over the past 48 hours. -Coumadin restarted with pharmacy dosing. Anemia, likely secondary to mild acute blood loss in the setting of anticoagulation. Patient's hemoglobin was 11.8 on  admission and has drifted down to 9.4. She denies bright red blood per rectum or melena, but she had some hematuria. -No need for packed red blood cell transfusion. We'll continue to monitor her H/H, which has been stable over the past 48 hours. Pepcid started empirically. Hyponatremia. Patient's serum sodium was 131 on admission. This is presumed to be secondary to volume overload. Her serum sodium had improved slightly on diuretic therapy. We'll continue to monitor. Chronic kidney disease, stage 3 to stage IV. Patient's creatinine was 1.62 admission. It has improved to 1.23. Continue to monitor. Diabetes mellitus with nephropathy. Patient is treated chronically with Humalog each morning; it is being held and she is being treated with sliding scale NovoLog. Her CBGs have been reasonable. UTI. Patient's urinalysis revealed many bacteria, WBCs, and RBCs. Rocephin started empirically on 10/13. Urine culture shows no growth Nonalcoholic fatty liver disease. She has hyperbilirubinemia, but no significant transaminitis. Abdominal ultrasound on 10/11 revealed small amount of ascites in the right lower quadrant. Will continue spironolactone. -Her ammonia level was 26.    Time spent: 35 minutes.    [facility] Pager [phone]. If 7PM-7AM, please contact night-coverage at [email or web address], password [facility] 03/29/2015, 10:29 AM  LOS: 5 days
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
## 2015-03-30 LAB — BASIC METABOLIC PANEL
ANION GAP: 11 (ref 5–15)
BUN: 28 mg/dL — ABNORMAL HIGH (ref 6–20)
CO2: 26 mmol/L (ref 22–32)
Calcium: 8.9 mg/dL (ref 8.9–10.3)
Chloride: 97 mmol/L — ABNORMAL LOW (ref 101–111)
Creatinine, Ser: 1.35 mg/dL — ABNORMAL HIGH (ref 0.44–1.00)
GFR calc Af Amer: 46 mL/min — ABNORMAL LOW (ref >60)
GFR calc non Af Amer: 39 mL/min — ABNORMAL LOW (ref >60)
GLUCOSE: 170 mg/dL — AB (ref 65–99)
POTASSIUM: 4 mmol/L (ref 3.5–5.1)
Sodium: 134 mmol/L — ABNORMAL LOW (ref 135–145)

## 2015-03-30 LAB — CBC
HEMATOCRIT: 31 % — AB (ref 36.0–46.0)
HEMOGLOBIN: 9.9 g/dL — AB (ref 12.0–15.0)
MCH: 26.7 pg (ref 26.0–34.0)
MCHC: 31.9 g/dL (ref 30.0–36.0)
MCV: 83.6 fL (ref 78.0–100.0)
Platelets: 262 10*3/uL (ref 150–400)
RBC: 3.71 MIL/uL — AB (ref 3.87–5.11)
RDW: 17.7 % — ABNORMAL HIGH (ref 11.5–15.5)
WBC: 13.5 10*3/uL — AB (ref 4.0–10.5)

## 2015-03-30 LAB — PROTIME-INR
INR: 5.49 (ref 0.00–1.49)
PROTHROMBIN TIME: 45.7 s — AB (ref 11.6–15.2)

## 2015-03-30 LAB — GLUCOSE, CAPILLARY
Glucose-Capillary: 156 mg/dL — ABNORMAL HIGH (ref 65–99)
Glucose-Capillary: 149 mg/dL — ABNORMAL HIGH (ref 65–99)
Glucose-Capillary: 153 mg/dL — ABNORMAL HIGH (ref 65–99)
Glucose-Capillary: 154 mg/dL — ABNORMAL HIGH (ref 65–99)

## 2015-03-30 NOTE — Progress Notes (Signed)
[facility] PROGRESS NOTE  Krystal [name] [id number]:[id number] DOB: 23-Apr-1946 DOA: 03/22/2015 PCP: Purvis Kilts, MD    Code Status: Full code Family Communication: Discussed with patient Disposition Plan: Discharge  when clinically appropriate   Consultants:  Cardiology  Procedures: Echocardiogram 03/25/2015: Study Conclusions  - Left ventricle: The cavity size was normal. Wall thickness was increased in a pattern of mild LVH. Systolic function was normal. The estimated ejection fraction was in the range of 60% to 65%. Wall motion was normal; there were no regional wall motion abnormalities. The study is not technically sufficient to allow evaluation of LV diastolic function. - Ventricular septum: The contour showed diastolic flattening and systolic flattening. - Aortic valve: Mildly calcified annulus. Trileaflet. - Mitral valve: Calcified annulus. There was trivial regurgitation. - Left atrium: The atrium was mildly dilated. - Right ventricle: The cavity size was severely dilated. Systolic function was reduced - difficult to assess. - Right atrium: The atrium was mildly dilated. - Tricuspid valve: There was moderate regurgitation. Peak RV-RA gradient (S): 65 mm Hg. - Pulmonary arteries: Systolic pressure could not be accurately estimated. - Pericardium, extracardiac: There was no pericardial effusion. Impressions: - Mild LVH with LVEF 60-65%, indeterminate diastolic function in the setting of atrial fibrillation. Septal flatening consistent with RV pressure and volume overload. Mild biatrial enlargement. Mildly sclerotic aortic valve. Severe RV dilatation with somewhat reduced contraction - difficult to assess. Moderate tricuspid regurgitation with suspected severe pulmonary hypertension, RV-RA gradient 65 mmHg. Unable to assess CVP.  Antibiotics:   Rocephin 10/13>>10/15  HPI/Subjective: Feels shortness of breath is slowly  improving. No chest pain. Feels weak.  Objective: Filed Vitals:   03/30/15 1301  BP: 150/69  Pulse:   Temp:   Resp:      Intake/Output Summary (Last 24 hours) at 03/30/15 1427 Last data filed at 03/30/15 1112  Gross per 24 hour  Intake     63 ml  Output   1225 ml  Net  -1162 ml   Filed Weights   03/26/15 0500 03/27/15 0500 03/28/15 0500  Weight: 145.4 kg (320 lb 8.8 oz) 146.9 kg (323 lb 13.7 oz) 146 kg (321 lb 14 oz)    Exam:   General:  Obese ill-appearing 69 year old woman, laying in bed with mild increase work of breathing in conversation.  Cardiovascular: Irregular, irregular   Respiratory: crackles at bases.  Abdomen: Obese, positive bowel sounds, nontender; nondistended.  GU: Dark yellow colored urine in the Foley catheter bag again noted with mild blood tinge.  Musculoskeletal/extremities: 1-2+ nonpitting edema.  Data Reviewed: Basic Metabolic Panel:  Recent Labs Lab 03/26/15 0527 03/27/15 0455 03/28/15 0527 03/29/15 0602 03/30/15 0607  NA 131* 132* 131* 132* 134*  K 3.6 3.5 3.2* 3.3* 4.0  CL 96* 96* 97* 97* 97*  CO2 24 23 24 26 26   GLUCOSE 138* 141* 156* 156* 170*  BUN 32* 33* 32* 29* 28*  CREATININE 1.59* 1.48* 1.42* 1.23* 1.35*  CALCIUM 8.9 9.1 8.8* 8.8* 8.9   Liver Function Tests:  Recent Labs Lab 04/06/2015 1954  AST 25  ALT 6*  ALKPHOS 143*  BILITOT 5.5*  PROT 7.8  ALBUMIN 3.1*    Recent Labs Lab 04/09/2015 1954  LIPASE 22    Recent Labs Lab 04/13/2015 2325 03/28/15 0527  AMMONIA 18 26   CBC:  Recent Labs Lab 03/16/2015 1954 03/25/15 0456 03/26/15 0527 03/28/15 0527  WBC 15.7* 12.7* 11.2* 10.0  NEUTROABS 13.5*  --   --   --  HGB 11.8* 10.0* 9.4* 9.5*  HCT 36.0 31.5* 29.0* 30.0*  MCV 85.7 84.9 83.8 84.3  PLT 281 247 229 237   Cardiac Enzymes:  Recent Labs Lab 03/29/2015 1954  TROPONINI <0.03   BNP (last 3 results)  Recent Labs  04/01/2015 1954  BNP 422.0*    ProBNP (last 3 results) No results for  input(s): PROBNP in the last 8760 hours.  CBG:  Recent Labs Lab 03/29/15 0724 03/29/15 1122 03/29/15 2115 03/30/15 0749 03/30/15 1138  GLUCAP 156* 165* 161* 156* 149*    Recent Results (from the past 240 hour(s))  MRSA PCR Screening     Status: None   Collection Time: 03/28/2015 11:25 PM  Result Value Ref Range Status   MRSA by PCR NEGATIVE NEGATIVE Final    Comment:        The GeneXpert MRSA Assay (FDA approved for NASAL specimens only), is one component of a comprehensive MRSA colonization surveillance program. It is not intended to diagnose MRSA infection nor to guide or monitor treatment for MRSA infections.   Culture, Urine     Status: None   Collection Time: 03/27/15  1:15 PM  Result Value Ref Range Status   Specimen Description URINE, CATHETERIZED  Final   Special Requests NONE  Final   Culture   Final    NO GROWTH 2 DAYS Performed at [facility]    Report Status 03/29/2015 FINAL  Final     Studies: No results found.  Scheduled Meds: . antiseptic oral rinse  7 mL Mouth Rinse BID  . atorvastatin  20 mg Oral QHS  . cholecalciferol  5,000 Units Oral Daily  . citalopram  20 mg Oral QHS  . diltiazem  60 mg Oral 4 times per day  . famotidine  20 mg Oral Daily  . Influenza vac split quadrivalent PF  0.5 mL Intramuscular Tomorrow-1000  . insulin aspart  0-15 Units Subcutaneous TID WC  . insulin aspart  0-5 Units Subcutaneous QHS  . levalbuterol  0.63 mg Nebulization TID  . omega-3 acid ethyl esters   Oral Weekly  . pantoprazole  40 mg Oral Daily  . senna-docusate  2 tablet Oral QHS  . sodium chloride  3 mL Intravenous Q12H  . spironolactone  25 mg Oral Daily  . Warfarin - Pharmacist Dosing Inpatient   Does not apply Q24H   Continuous Infusions: . furosemide (LASIX) infusion 8 mg/hr (03/29/15 2200)   Assessment and plan:  Principal Problem:   Atrial fibrillation (HCC) Active Problems:   Diabetes mellitus with nephropathy (HCC)   OBESITY    Essential hypertension   Diastolic CHF, acute on chronic (HCC)   Long term (current) use of anticoagulants   CKD (chronic kidney disease), stage IV (HCC)   Nonalcoholic fatty liver disease   Atrial fibrillation with rapid ventricular response (HCC)   Hematuria, gross   Acute blood loss anemia   Supratherapeutic INR   UTI (lower urinary tract infection)   NASH (nonalcoholic steatohepatitis)    1. Acute on chronic diastolic congestive heart failure with superimposed right heart dysfunction and pulmonary hypertension. On admission, on exam, the patient had significant volume overload. She admitted to dietary noncompliance. Patient was started on IV Lasix. Cardiology was consulted and started the patient on a Lasix drip.  She is being continued on spironolactone. -urine output is improving. She made 2.0L of urine yesterday -She is currently on  Lasix drip to 8 mg per hour. Chronic atrial fibrillation with RVR on  admission. The patient is treated chronically with verapamil and Coumadin. Her INR was supratherapeutic at 9.81. It was held. Verapamil was held and she was started on a diltiazem drip. Cardiology discontinued the diltiazem drip on 10/12 and started her on oral diltiazem. Echocardiogram results noted above. Her TSH was within normal limits. -Her INR improved and Coumadin was restarted and dosed per pharmacy. Continue to monitor INR since it is trending back up now. No evidence of significant bleeding at this time. Warfarin coagulopathy/supratherapeutic INR The patient is treated with warfarin for chronic atrial fibrillation. Her INR was supratherapeutic at 9.81on admission. It looks like she may have mild hematuria. Her hemoglobin has drifted down from 11.8 on admission to 9.4. Her INR initially decreased to therapeutic range, but has started trending back up again.. -Coumadin restarted with pharmacy dosing. She has not had any significant bleeding, so we will continue to monitor for  now. Anemia, likely secondary to mild acute blood loss in the setting of anticoagulation. Patient's hemoglobin was 11.8 on admission and has drifted down to 9.4. She denies bright red blood per rectum or melena, but she had some hematuria. -No need for packed red blood cell transfusion. We'll continue to monitor her H/H, which has been stable over the past 48 hours. Pepcid started empirically. Hyponatremia. Patient's serum sodium was 131 on admission. This is presumed to be secondary to volume overload. Her serum sodium is improving on diuretic therapy. We'll continue to monitor. Chronic kidney disease, stage 3 to stage IV. Patient's creatinine was 1.62 admission. It has improved to 1.35. Continue to monitor. Diabetes mellitus with nephropathy. Patient is treated chronically with Humalog each morning; it is being held and she is being treated with sliding scale NovoLog. Her CBGs have been reasonable. Nonalcoholic fatty liver disease. She has hyperbilirubinemia, but no significant transaminitis. Abdominal ultrasound on 10/11 revealed small amount of ascites in the right lower quadrant. Will continue spironolactone. -Her ammonia level was 26.    Time spent: 25 minutes.    [facility] Pager [phone]. If 7PM-7AM, please contact night-coverage at [email or web address], password [facility] 03/30/2015, 2:27 PM  LOS: 6 days

## 2015-03-31 ENCOUNTER — Inpatient Hospital Stay (HOSPITAL_COMMUNITY): Payer: Medicare Other

## 2015-03-31 DIAGNOSIS — I482 Chronic atrial fibrillation: Principal | ICD-10-CM

## 2015-03-31 DIAGNOSIS — I509 Heart failure, unspecified: Secondary | ICD-10-CM | POA: Insufficient documentation

## 2015-03-31 LAB — GLUCOSE, CAPILLARY
Glucose-Capillary: 160 mg/dL — ABNORMAL HIGH (ref 65–99)
Glucose-Capillary: 187 mg/dL — ABNORMAL HIGH (ref 65–99)
Glucose-Capillary: 157 mg/dL — ABNORMAL HIGH (ref 65–99)
Glucose-Capillary: 156 mg/dL — ABNORMAL HIGH (ref 65–99)

## 2015-03-31 LAB — URINALYSIS, ROUTINE W REFLEX MICROSCOPIC
Bilirubin Urine: NEGATIVE
GLUCOSE, UA: NEGATIVE mg/dL
KETONES UR: NEGATIVE mg/dL
NITRITE: NEGATIVE
PROTEIN: NEGATIVE mg/dL
Specific Gravity, Urine: 1.01 (ref 1.005–1.030)
Urobilinogen, UA: 0.2 mg/dL (ref 0.0–1.0)
pH: 5 (ref 5.0–8.0)

## 2015-03-31 LAB — URINE MICROSCOPIC-ADD ON

## 2015-03-31 LAB — BASIC METABOLIC PANEL
ANION GAP: 11 (ref 5–15)
BUN: 27 mg/dL — AB (ref 6–20)
CALCIUM: 9.3 mg/dL (ref 8.9–10.3)
CO2: 27 mmol/L (ref 22–32)
CREATININE: 1.3 mg/dL — AB (ref 0.44–1.00)
Chloride: 96 mmol/L — ABNORMAL LOW (ref 101–111)
GFR calc Af Amer: 48 mL/min — ABNORMAL LOW (ref >60)
GFR, EST NON AFRICAN AMERICAN: 41 mL/min — AB (ref >60)
GLUCOSE: 177 mg/dL — AB (ref 65–99)
Potassium: 4.5 mmol/L (ref 3.5–5.1)
Sodium: 134 mmol/L — ABNORMAL LOW (ref 135–145)

## 2015-03-31 LAB — CBC
HEMATOCRIT: 29.9 % — AB (ref 36.0–46.0)
Hemoglobin: 9.5 g/dL — ABNORMAL LOW (ref 12.0–15.0)
MCH: 26.5 pg (ref 26.0–34.0)
MCHC: 31.8 g/dL (ref 30.0–36.0)
MCV: 83.5 fL (ref 78.0–100.0)
PLATELETS: 243 10*3/uL (ref 150–400)
RBC: 3.58 MIL/uL — ABNORMAL LOW (ref 3.87–5.11)
RDW: 17.5 % — AB (ref 11.5–15.5)
WBC: 16.1 10*3/uL — AB (ref 4.0–10.5)

## 2015-03-31 LAB — PROTIME-INR
INR: 6.09 (ref 0.00–1.49)
Prothrombin Time: 52.1 seconds — ABNORMAL HIGH (ref 11.6–15.2)

## 2015-03-31 MED ORDER — VITAMIN K1 10 MG/ML IJ SOLN
10.0000 mg | Freq: Once | INTRAMUSCULAR | Status: AC
  Administered 2015-03-31: 10 mg via SUBCUTANEOUS
  Filled 2015-03-31: qty 1

## 2015-03-31 NOTE — Progress Notes (Signed)
[facility] PROGRESS NOTE  Krystal [name] [id number]:[id number] DOB: Sep 28, 1945 DOA: 04/06/2015 PCP: Purvis Kilts, MD    Code Status: Full code Family Communication: Discussed with patient Disposition Plan: Discharge  when clinically appropriate   Consultants:  Cardiology  Procedures: Echocardiogram 03/25/2015: Study Conclusions  - Left ventricle: The cavity size was normal. Wall thickness was increased in a pattern of mild LVH. Systolic function was normal. The estimated ejection fraction was in the range of 60% to 65%. Wall motion was normal; there were no regional wall motion abnormalities. The study is not technically sufficient to allow evaluation of LV diastolic function. - Ventricular septum: The contour showed diastolic flattening and systolic flattening. - Aortic valve: Mildly calcified annulus. Trileaflet. - Mitral valve: Calcified annulus. There was trivial regurgitation. - Left atrium: The atrium was mildly dilated. - Right ventricle: The cavity size was severely dilated. Systolic function was reduced - difficult to assess. - Right atrium: The atrium was mildly dilated. - Tricuspid valve: There was moderate regurgitation. Peak RV-RA gradient (S): 65 mm Hg. - Pulmonary arteries: Systolic pressure could not be accurately estimated. - Pericardium, extracardiac: There was no pericardial effusion. Impressions: - Mild LVH with LVEF 60-65%, indeterminate diastolic function in the setting of atrial fibrillation. Septal flatening consistent with RV pressure and volume overload. Mild biatrial enlargement. Mildly sclerotic aortic valve. Severe RV dilatation with somewhat reduced contraction - difficult to assess. Moderate tricuspid regurgitation with suspected severe pulmonary hypertension, RV-RA gradient 65 mmHg. Unable to assess CVP.  Antibiotics:   Rocephin 10/13>>10/15  HPI/Subjective: Patient denies purulent cough,  diarrhea, or pain with with urination with a Foley catheter in place. Her shortness of breath is about the same.  Objective: Filed Vitals:   03/31/15 0500  BP: 132/67  Pulse: 96  Temp: 98.6 F (37 C)  Resp: 20   oxygen saturation 91% on supplemental oxygen.  Intake/Output Summary (Last 24 hours) at 03/31/15 1741 Last data filed at 03/31/15 1512  Gross per 24 hour  Intake    843 ml  Output   2150 ml  Net  -1307 ml   Filed Weights   03/26/15 0500 03/27/15 0500 03/28/15 0500  Weight: 145.4 kg (320 lb 8.8 oz) 146.9 kg (323 lb 13.7 oz) 146 kg (321 lb 14 oz)    Exam:   General:  Obese ill-appearing 69 year old woman, laying in bed with mild increase work of breathing during conversation.  Cardiovascular: Irregular, irregular   Respiratory: crackles at bases.  Abdomen: Obese, positive bowel sounds, nontender; nondistended.  GU: Yellow urine in the Foley bag, no pink colored urine at this time.  Musculoskeletal/extremities: 1-2+ nonpitting edema of both lower extremities.  Neurologic: She has a flat affect, but she is alert and oriented 2.  Data Reviewed: Basic Metabolic Panel:  Recent Labs Lab 03/27/15 0455 03/28/15 0527 03/29/15 0602 03/30/15 0607 03/31/15 0500  NA 132* 131* 132* 134* 134*  K 3.5 3.2* 3.3* 4.0 4.5  CL 96* 97* 97* 97* 96*  CO2 23 24 26 26 27   GLUCOSE 141* 156* 156* 170* 177*  BUN 33* 32* 29* 28* 27*  CREATININE 1.48* 1.42* 1.23* 1.35* 1.30*  CALCIUM 9.1 8.8* 8.8* 8.9 9.3   Liver Function Tests:  Recent Labs Lab 03/29/2015 1954  AST 25  ALT 6*  ALKPHOS 143*  BILITOT 5.5*  PROT 7.8  ALBUMIN 3.1*    Recent Labs Lab 03/25/2015 1954  LIPASE 22    Recent Labs Lab 04/09/2015 2325 03/28/15 0527  AMMONIA 18  26   CBC:  Recent Labs Lab 03/30/2015 1954 03/25/15 0456 03/26/15 0527 03/28/15 0527 03/30/15 1607 03/31/15 0500  WBC 15.7* 12.7* 11.2* 10.0 13.5* 16.1*  NEUTROABS 13.5*  --   --   --   --   --   HGB 11.8* 10.0* 9.4* 9.5*  9.9* 9.5*  HCT 36.0 31.5* 29.0* 30.0* 31.0* 29.9*  MCV 85.7 84.9 83.8 84.3 83.6 83.5  PLT 281 247 229 237 262 243   Cardiac Enzymes:  Recent Labs Lab 03/30/2015 1954  TROPONINI <0.03   BNP (last 3 results)  Recent Labs  04/06/2015 1954  BNP 422.0*    ProBNP (last 3 results) No results for input(s): PROBNP in the last 8760 hours.  CBG:  Recent Labs Lab 03/30/15 1712 03/30/15 2122 03/31/15 0806 03/31/15 1128 03/31/15 1650  GLUCAP 153* 154* 160* 187* 157*    Recent Results (from the past 240 hour(s))  MRSA PCR Screening     Status: None   Collection Time: 04/07/2015 11:25 PM  Result Value Ref Range Status   MRSA by PCR NEGATIVE NEGATIVE Final    Comment:        The GeneXpert MRSA Assay (FDA approved for NASAL specimens only), is one component of a comprehensive MRSA colonization surveillance program. It is not intended to diagnose MRSA infection nor to guide or monitor treatment for MRSA infections.   Culture, Urine     Status: None   Collection Time: 03/27/15  1:15 PM  Result Value Ref Range Status   Specimen Description URINE, CATHETERIZED  Final   Special Requests NONE  Final   Culture   Final    NO GROWTH 2 DAYS Performed at [facility]    Report Status 03/29/2015 FINAL  Final     Studies: No results found.  Scheduled Meds: . antiseptic oral rinse  7 mL Mouth Rinse BID  . atorvastatin  20 mg Oral QHS  . cholecalciferol  5,000 Units Oral Daily  . citalopram  20 mg Oral QHS  . diltiazem  60 mg Oral 4 times per day  . famotidine  20 mg Oral Daily  . Influenza vac split quadrivalent PF  0.5 mL Intramuscular Tomorrow-1000  . insulin aspart  0-15 Units Subcutaneous TID WC  . insulin aspart  0-5 Units Subcutaneous QHS  . levalbuterol  0.63 mg Nebulization TID  . omega-3 acid ethyl esters   Oral Weekly  . pantoprazole  40 mg Oral Daily  . senna-docusate  2 tablet Oral QHS  . sodium chloride  3 mL Intravenous Q12H  . spironolactone  25 mg  Oral Daily  . Warfarin - Pharmacist Dosing Inpatient   Does not apply Q24H   Continuous Infusions: . furosemide (LASIX) infusion 8 mg/hr (03/31/15 1019)   Assessment and plan:  Principal Problem:   Atrial fibrillation ([place]) Active Problems:   Atrial fibrillation with rapid ventricular response (HCC)   Diastolic CHF, acute on chronic (HCC)   CKD (chronic kidney disease), stage IV (HCC)   Nonalcoholic fatty liver disease   Supratherapeutic INR   Diabetes mellitus with nephropathy ([place])   OBESITY   Essential hypertension   Long term (current) use of anticoagulants   Hematuria, gross   Acute blood loss anemia   NASH (nonalcoholic steatohepatitis)    1. Acute on chronic diastolic congestive heart failure with superimposed right heart dysfunction and pulmonary hypertension. On admission, on exam, the patient had significant volume overload. She admitted to dietary noncompliance. Patient was started on  IV Lasix. Cardiology was consulted and started the patient on a Lasix drip.  She is being continued on spironolactone. -urine output is improving. She is -2.8 L thus far. Slow diuresis, but with progressive improvement. -She is currently on  Lasix drip to 8 mg per hour and is being continued by cardiology. Chronic atrial fibrillation with RVR on admission. The patient is treated chronically with verapamil and Coumadin. Her INR was supratherapeutic at 9.81. It was held. Verapamil was held and she was started on a diltiazem drip. Cardiology discontinued the diltiazem drip on 10/12 and started her on oral diltiazem. Echocardiogram results noted above. Her TSH was within normal limits. -Her INR improved and Coumadin was restarted and dosed per pharmacy. Her INR is now trending back up and Coumadin is being held again per pharmacy. Warfarin coagulopathy/supratherapeutic INR The patient is treated with warfarin for chronic atrial fibrillation. Her INR was supratherapeutic at 9.81on admission. It  looks like she may have mild hematuria. Her hemoglobin has drifted down from 11.8 on admission to 9.4.  Her INR initially decreased to therapeutic range and Coumadin was restarted. Now, it has started trending back up again. We'll hold Coumadin per pharmacy until INR has improved. -Query etiology of increase of INR; consider liver disease as continuing etiology. - No significant bleeding at this time. -We'll reassess liver function tests. Anemia, likely secondary to mild acute blood loss in the setting of anticoagulation. Patient's hemoglobin was 11.8 on admission and has drifted down to 9.4. She denies bright red blood per rectum or melena, but she had some hematuria. -No need for packed red blood cell transfusion. We'll continue to monitor her H/H, which has been stable over the past 48 hours. Pepcid started empirically. Hyponatremia. Patient's serum sodium was 131 on admission. This is presumed to be secondary to volume overload. Her serum sodium is improving on diuretic therapy. We'll continue to monitor. Chronic kidney disease, stage 3 to stage IV. Patient's creatinine was 1.62 admission. It has improved to 1.30. Continue to monitor. Diabetes mellitus with nephropathy. Patient is treated chronically with Humalog each morning; it is being held and she is being treated with sliding scale NovoLog. Her CBGs have been reasonable. Nonalcoholic fatty liver disease. She has hyperbilirubinemia, but no significant transaminitis. Abdominal ultrasound on 10/11 revealed small amount of ascites in the right lower quadrant. Will continue spironolactone. -Her ammonia level was 26. -Her liver disease may be contributing to elevated INR. -We'll recheck liver function tests. Leukocytosis. The patient's white blood cell count has trended up to 16.1. No evidence of fever. -Patient treated with Rocephin for UTI 3 days. -We'll order another urinalysis. Will order chest x-ray for further evaluation.  Morbidly  obese with deconditioning. Agree with cardiology that she may have obesity hypoventilation syndrome. She will likely need skilled nursing facility placement for long-term rehabilitation.    Time spent: 30 minutes.    [facility] Pager [phone]. If 7PM-7AM, please contact night-coverage at [email or web address], password [facility] 03/31/2015, 5:41 PM  LOS: 7 days

## 2015-03-31 NOTE — Progress Notes (Signed)
CRITICAL VALUE ALERT  Critical value received:  INR=6.09  Date of notification:  03/31/15  Time of notification:  2081  Critical value read back:Yes.    Nurse who received alert:  Manuela Schwartz, RN   MD notified (1st page):  Dr. Susanne Borders  Time of first page:  [phone]  MD notified (2nd page):  Time of second page:  Responding MD:  Dr. Susanne Borders  Time MD responded:  No new orders at this time.  Nursing staff to continue to monitor

## 2015-03-31 NOTE — Care Management Note (Signed)
Case Management Note  Patient Details  Name: ARBOR LEER MRN: [id number] Date of Birth: 05/15/1946  Expected Discharge Date:  03/27/15               Expected Discharge Plan:  Skilled Nursing Facility  In-House Referral:  Clinical Social Work  Discharge planning Services  CM Consult  Post Acute Care Choice:  Home Health Choice offered to:  Patient  DME Arranged:    DME Agency:     HH Arranged:    [place] Agency:     Status of Service:  In process, will continue to follow  Medicare Important Message Given:    Date Medicare IM Given:    Medicare IM give by:    Date Additional Medicare IM Given:    Additional Medicare Important Message give by:     If discussed at [place] of Stay Meetings, dates discussed:    Additional Comments: PT has made SNF recommendation. CSW has been referred and pt is agreeable to go to SNF and CSW is working with Pt to arrange placement. No further CM needs noted, will cont to follow. [facility] will be updated on change to DC plan.  Sherald Barge, RN 03/31/2015, 2:02 PM
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
## 2015-03-31 NOTE — Progress Notes (Signed)
Patient ID: Krystal [name], female   DOB: [birth year], 69 y.o.   MRN: [id number]    Primary cardiologist: Dr. Kate Sable  Seen for followup: CHF, atrial fibrillation  Subjective:    NO cardiac complaints weak   Objective:   Temp:  [98.6 F (37 C)-98.9 F (37.2 C)] 98.6 F (37 C) (10/17 0500) Pulse Rate:  [90-97] 96 (10/17 0500) Resp:  [20] 20 (10/17 0500) BP: (132-150)/(57-69) 132/67 mmHg (10/17 0500) SpO2:  [69 %-96 %] 85 % (10/17 0746) Last BM Date: 03/30/15  Filed Weights   03/26/15 0500 03/27/15 0500 03/28/15 0500  Weight: 145.4 kg (320 lb 8.8 oz) 146.9 kg (323 lb 13.7 oz) 146 kg (321 lb 14 oz)    Intake/Output Summary (Last 24 hours) at 03/31/15 0809 Last data filed at 03/31/15 0501  Gross per 24 hour  Intake     63 ml  Output   3100 ml  Net  -3037 ml    Telemetry: Atrial fibrillation around  90-100 bpm.  03/31/2015   Exam:  General: Morbidly obese, no distress.  Lungs: Decreased breath sounds.  Cardiac: Elevated JVP. Irregularly irregular without gallop.  Abdomen: Obese, protuberant. NABS.  Extremities: Chronic appearing edema plus 2 bilaterally   Lab Results:  Basic Metabolic Panel:  Recent Labs Lab 03/29/15 0602 03/30/15 0607 03/31/15 0500  NA 132* 134* 134*  K 3.3* 4.0 4.5  CL 97* 97* 96*  CO2 26 26 27   GLUCOSE 156* 170* 177*  BUN 29* 28* 27*  CREATININE 1.23* 1.35* 1.30*  CALCIUM 8.8* 8.9 9.3    Liver Function Tests:  Recent Labs Lab 03/16/2015 1954  AST 25  ALT 6*  ALKPHOS 143*  BILITOT 5.5*  PROT 7.8  ALBUMIN 3.1*    CBC:  Recent Labs Lab 03/28/15 0527 03/30/15 1607 03/31/15 0500  WBC 10.0 13.5* 16.1*  HGB 9.5* 9.9* 9.5*  HCT 30.0* 31.0* 29.9*  MCV 84.3 83.6 83.5  PLT 237 262 243    Cardiac Enzymes:  Recent Labs Lab 04/04/2015 1954  TROPONINI <0.03    Coagulation:  Recent Labs Lab 03/29/15 0602 03/30/15 0607 03/31/15 0500  INR 4.52* 5.49* 6.09*    Echocardiogram 03/25/2015: Study  Conclusions  - Left ventricle: The cavity size was normal. Wall thickness was increased in a pattern of mild LVH. Systolic function was normal. The estimated ejection fraction was in the range of 60% to 65%. Wall motion was normal; there were no regional wall motion abnormalities. The study is not technically sufficient to allow evaluation of LV diastolic function. - Ventricular septum: The contour showed diastolic flattening and systolic flattening. - Aortic valve: Mildly calcified annulus. Trileaflet. - Mitral valve: Calcified annulus. There was trivial regurgitation. - Left atrium: The atrium was mildly dilated. - Right ventricle: The cavity size was severely dilated. Systolic function was reduced - difficult to assess. - Right atrium: The atrium was mildly dilated. - Tricuspid valve: There was moderate regurgitation. Peak RV-RA gradient (S): 65 mm Hg. - Pulmonary arteries: Systolic pressure could not be accurately estimated. - Pericardium, extracardiac: There was no pericardial effusion.  Impressions:  - Mild LVH with LVEF 60-65%, indeterminate diastolic function in the setting of atrial fibrillation. Septal flatening consistent with RV pressure and volume overload. Mild biatrial enlargement. Mildly sclerotic aortic valve. Severe RV dilatation with somewhat reduced contraction - difficult to assess. Moderate tricuspid regurgitation with suspected severe pulmonary hypertension, RV-RA gradient 65 mmHg. Unable to assess CVP.  Abdominal ultrasound 03/25/2015: TECHNIQUE: Limited ultrasound survey for  ascites was performed in all four abdominal quadrants.  COMPARISON: None.  FINDINGS: Small amount of ascites right lower quadrant. Otherwise negative.  IMPRESSION: Small amount of ascites right lower quadrant.   Medications:   Scheduled Medications: . antiseptic oral rinse  7 mL Mouth Rinse BID  . atorvastatin  20 mg Oral QHS  .  cholecalciferol  5,000 Units Oral Daily  . citalopram  20 mg Oral QHS  . diltiazem  60 mg Oral 4 times per day  . famotidine  20 mg Oral Daily  . Influenza vac split quadrivalent PF  0.5 mL Intramuscular Tomorrow-1000  . insulin aspart  0-15 Units Subcutaneous TID WC  . insulin aspart  0-5 Units Subcutaneous QHS  . levalbuterol  0.63 mg Nebulization TID  . omega-3 acid ethyl esters   Oral Weekly  . pantoprazole  40 mg Oral Daily  . senna-docusate  2 tablet Oral QHS  . sodium chloride  3 mL Intravenous Q12H  . spironolactone  25 mg Oral Daily  . Warfarin - Pharmacist Dosing Inpatient   Does not apply Q24H    Infusions: . furosemide (LASIX) infusion 8 mg/hr (03/29/15 2200)     Assessment:   1. Volume overload with suspected diastolic heart failure and component of RV dysfunction with pulmonary hypertension. Weight not much change but output 3Liters According to nursing.  Continue lasix drip per SM although not sure there is any data to suggest drip any better than tid dosing  2. Chronic atrial fibrillation. She is been followed by pharmacy. Coumadin held initially with supratherapeutic INR. She has been converted to oral diltiazem. Given  Persistent elevation will give a dose of Vit K  3. CKD stage 3. Creatinine down to 1.3 and stable on Lasix GTT.  4. Anemia, hemoglobin down to 9.5 from 11.8.  5. UTI, management per primary team.   Plan/Discussion:    Continue diuresis primary issue is obesity hypoventilation syndrome   Jenkins Rouge

## 2015-03-31 NOTE — Clinical Social Work Note (Signed)
Clinical Social Work Assessment  Patient Details  Name: Krystal [name] MRN: [id number] Date of Birth: [birth year]  Date of referral:  03/31/15               Reason for consult:  Facility Placement                Permission sought to share information with:    Permission granted to share information::     Name::        Agency::     Relationship::     Contact Information:     Housing/Transportation Living arrangements for the past 2 months:  Single Family Home Source of Information:  Patient Patient Interpreter Needed:  None Criminal Activity/Legal Involvement Pertinent to Current Situation/Hospitalization:  No - Comment as needed Significant Relationships:  Spouse Lives with:  Spouse Do you feel safe going back to the place where you live?  Yes Need for family participation in patient care:  Yes (Comment)  Care giving concerns:  None identified.   Social Worker assessment / plan:  Patient advised that she resides in the home with her husband, Krystal [name]. She stated that she uses a wheelchair and that her husband assists her with ADLs if she needs assistance.  Patient stated that she is willing to go to SNF. She indicated that she wants to go to a facility close to the hospital. Patient was agreeable to be faxed out to [facility] and [facility].    Employment status:  Retired Nurse, adult PT Recommendations:  Not assessed at this time Information / Referral to community resources:  [place]  Patient/Family's Response to care:  Patient is agreeable to go to SNF.   Patient/Family's Understanding of and Emotional Response to Diagnosis, Current Treatment, and Prognosis:  Patient understands her diagnosis, treatment and prognosis.    Emotional Assessment Appearance:  Appears stated age Attitude/Demeanor/Rapport:   (Cooperative) Affect (typically observed):  Calm Orientation:  Oriented to Self, Oriented to Place, Oriented to  Time, Oriented to  Situation Alcohol / Substance use:  Not Applicable Psych involvement (Current and /or in the community):  No (Comment)  Discharge Needs  Concerns to be addressed:  Discharge Planning Concerns Readmission within the last 30 days:  No Current discharge risk:  None Barriers to Discharge:  No Barriers Identified   Ihor Gully, LCSW 03/31/2015, 12:45 PM [phone]

## 2015-04-01 ENCOUNTER — Inpatient Hospital Stay (HOSPITAL_COMMUNITY): Payer: Medicare Other

## 2015-04-01 DIAGNOSIS — E1121 Type 2 diabetes mellitus with diabetic nephropathy: Secondary | ICD-10-CM

## 2015-04-01 DIAGNOSIS — J189 Pneumonia, unspecified organism: Secondary | ICD-10-CM

## 2015-04-01 DIAGNOSIS — R791 Abnormal coagulation profile: Secondary | ICD-10-CM

## 2015-04-01 DIAGNOSIS — E669 Obesity, unspecified: Secondary | ICD-10-CM

## 2015-04-01 DIAGNOSIS — K760 Fatty (change of) liver, not elsewhere classified: Secondary | ICD-10-CM

## 2015-04-01 LAB — GLUCOSE, CAPILLARY
GLUCOSE-CAPILLARY: 154 mg/dL — AB (ref 65–99)
GLUCOSE-CAPILLARY: 156 mg/dL — AB (ref 65–99)
Glucose-Capillary: 146 mg/dL — ABNORMAL HIGH (ref 65–99)
Glucose-Capillary: 151 mg/dL — ABNORMAL HIGH (ref 65–99)

## 2015-04-01 LAB — BASIC METABOLIC PANEL
ANION GAP: 13 (ref 5–15)
Anion gap: 12 (ref 5–15)
BUN: 26 mg/dL — ABNORMAL HIGH (ref 6–20)
BUN: 28 mg/dL — AB (ref 6–20)
CALCIUM: 9 mg/dL (ref 8.9–10.3)
CHLORIDE: 93 mmol/L — AB (ref 101–111)
CO2: 25 mmol/L (ref 22–32)
CO2: 26 mmol/L (ref 22–32)
CREATININE: 1.4 mg/dL — AB (ref 0.44–1.00)
Calcium: 9 mg/dL (ref 8.9–10.3)
Chloride: 96 mmol/L — ABNORMAL LOW (ref 101–111)
Creatinine, Ser: 1.32 mg/dL — ABNORMAL HIGH (ref 0.44–1.00)
GFR calc Af Amer: 47 mL/min — ABNORMAL LOW (ref >60)
GFR calc non Af Amer: 38 mL/min — ABNORMAL LOW (ref >60)
GFR, EST AFRICAN AMERICAN: 44 mL/min — AB (ref >60)
GFR, EST NON AFRICAN AMERICAN: 40 mL/min — AB (ref >60)
Glucose, Bld: 165 mg/dL — ABNORMAL HIGH (ref 65–99)
Glucose, Bld: 155 mg/dL — ABNORMAL HIGH (ref 65–99)
POTASSIUM: 4.2 mmol/L (ref 3.5–5.1)
Potassium: 3.9 mmol/L (ref 3.5–5.1)
SODIUM: 131 mmol/L — AB (ref 135–145)
Sodium: 134 mmol/L — ABNORMAL LOW (ref 135–145)

## 2015-04-01 LAB — PROTIME-INR
INR: 8.07 (ref 0.00–1.49)
PROTHROMBIN TIME: 64.4 s — AB (ref 11.6–15.2)

## 2015-04-01 LAB — CBC
HCT: 29.4 % — ABNORMAL LOW (ref 36.0–46.0)
Hemoglobin: 9.5 g/dL — ABNORMAL LOW (ref 12.0–15.0)
MCH: 26.8 pg (ref 26.0–34.0)
MCHC: 32.3 g/dL (ref 30.0–36.0)
MCV: 82.8 fL (ref 78.0–100.0)
PLATELETS: 211 10*3/uL (ref 150–400)
RBC: 3.55 MIL/uL — ABNORMAL LOW (ref 3.87–5.11)
RDW: 17.8 % — AB (ref 11.5–15.5)
WBC: 22.3 10*3/uL — AB (ref 4.0–10.5)

## 2015-04-01 LAB — HEPATIC FUNCTION PANEL
ALBUMIN: 2.4 g/dL — AB (ref 3.5–5.0)
ALT: 7 U/L — AB (ref 14–54)
AST: 29 U/L (ref 15–41)
Alkaline Phosphatase: 142 U/L — ABNORMAL HIGH (ref 38–126)
BILIRUBIN INDIRECT: 2 mg/dL — AB (ref 0.3–0.9)
Bilirubin, Direct: 2.8 mg/dL — ABNORMAL HIGH (ref 0.1–0.5)
TOTAL PROTEIN: 7.2 g/dL (ref 6.5–8.1)
Total Bilirubin: 4.8 mg/dL — ABNORMAL HIGH (ref 0.3–1.2)

## 2015-04-01 LAB — OCCULT BLOOD X 1 CARD TO LAB, STOOL: Fecal Occult Bld: NEGATIVE

## 2015-04-01 MED ORDER — IOHEXOL 300 MG/ML  SOLN
50.0000 mL | Freq: Once | INTRAMUSCULAR | Status: AC | PRN
Start: 2015-04-01 — End: 2015-04-01
  Administered 2015-04-01: 50 mL via ORAL

## 2015-04-01 MED ORDER — FUROSEMIDE 10 MG/ML IJ SOLN
40.0000 mg | Freq: Three times a day (TID) | INTRAMUSCULAR | Status: DC
  Administered 2015-04-01 – 2015-04-02 (×3): 40 mg via INTRAVENOUS
  Filled 2015-04-01 (×3): qty 4

## 2015-04-01 MED ORDER — LEVALBUTEROL HCL 0.63 MG/3ML IN NEBU
0.6300 mg | INHALATION_SOLUTION | Freq: Four times a day (QID) | RESPIRATORY_TRACT | Status: DC | PRN
  Administered 2015-04-01 – 2015-04-02 (×3): 0.63 mg via RESPIRATORY_TRACT
  Filled 2015-04-01 (×3): qty 3

## 2015-04-01 MED ORDER — PHYTONADIONE 5 MG PO TABS
2.5000 mg | ORAL_TABLET | Freq: Once | ORAL | Status: AC
  Administered 2015-04-01: 2.5 mg via ORAL
  Filled 2015-04-01: qty 1

## 2015-04-01 MED ORDER — LEVOFLOXACIN IN D5W 750 MG/150ML IV SOLN
750.0000 mg | INTRAVENOUS | Status: DC
  Administered 2015-04-01 – 2015-04-02 (×2): 750 mg via INTRAVENOUS
  Filled 2015-04-01 (×2): qty 150

## 2015-04-01 MED ORDER — FUROSEMIDE 10 MG/ML IJ SOLN
80.0000 mg | Freq: Once | INTRAMUSCULAR | Status: AC
  Administered 2015-04-01: 80 mg via INTRAVENOUS
  Filled 2015-04-01: qty 8

## 2015-04-01 MED ORDER — METOLAZONE 5 MG PO TABS
5.0000 mg | ORAL_TABLET | Freq: Once | ORAL | Status: AC
  Administered 2015-04-01: 5 mg via ORAL
  Filled 2015-04-01: qty 1

## 2015-04-01 NOTE — Progress Notes (Signed)
Consulting cardiologist: Kate Sable MD Primary Cardiologist: Kate Sable MD  Cardiology Specific Problem List: 1.Diastolic CHF 2. Chronic Atrial fibrillation   Subjective:   Generalized fatigue-severe. Abdominal pain. Anorexia.    Objective:   Temp:  [97.7 F (36.5 C)-98.1 F (36.7 C)] 97.7 F (36.5 C) (10/18 0554) Pulse Rate:  [98-106] 98 (10/18 0822) Resp:  [18-20] 18 (10/18 0822) BP: (124-131)/(59-91) 131/91 mmHg (10/18 0554) SpO2:  [91 %-98 %] 93 % (10/18 0822) Weight:  [315 lb 5.8 oz (143.046 kg)] 315 lb 5.8 oz (143.046 kg) (10/18 0554) Last BM Date: 03/29/15  Filed Weights   03/27/15 0500 03/28/15 0500 04/01/15 0554  Weight: 323 lb 13.7 oz (146.9 kg) 321 lb 14 oz (146 kg) 315 lb 5.8 oz (143.046 kg)    Intake/Output Summary (Last 24 hours) at 04/01/15 0929 Last data filed at 04/01/15 0555  Gross per 24 hour  Intake 1484.05 ml  Output   1650 ml  Net -165.95 ml    Telemetry: Atrial fib   Exam:  General: No acute distress.  HEENT: Conjunctiva and lids normal, oropharynx clear.  Lungs: Clear to auscultation, nonlabored.Diminished in the bases, with poor inspiratory effort.   Cardiac: No elevated JVP or bruits. IRRR, no gallop or rub.   Abdomen: Normoactive bowel sounds, nontender, nondistended.  Extremities: 1+-2+ pitting edema, distal pulses full.  Neuropsychiatric: Alert and oriented x3, affect appropriate.  Echocardiogram 03/25/2015 Mild LVH with LVEF 60-65%, indeterminate diastolic function in the setting of atrial fibrillation. Septal flatening consistent with RV pressure and volume overload. Mild biatrial enlargement. Mildly sclerotic aortic valve. Severe RV dilatation with somewhat reduced contraction - difficult to assess. Moderate tricuspid regurgitation with suspected severe pulmonary hypertension, RV-RA gradient 65 mmHg. Unable to assess CVP.  Cardiac Cath 02/12/2015  No significant CAD.  Mildly increased  LVEDP.  Continue preventive therapy. Resume Coumadin tomorrow. F/u with Dr. Bronson Ing  Lab Results:  Basic Metabolic Panel:  Recent Labs Lab 03/30/15 0607 03/31/15 0500 04/01/15 0615  NA 134* 134* 134*  K 4.0 4.5 4.2  CL 97* 96* 96*  CO2 26 27 25   GLUCOSE 170* 177* 165*  BUN 28* 27* 26*  CREATININE 1.35* 1.30* 1.32*  CALCIUM 8.9 9.3 9.0    Liver Function Tests:  Recent Labs Lab 04/01/15 0615  AST 29  ALT 7*  ALKPHOS 142*  BILITOT 4.8*  PROT 7.2  ALBUMIN 2.4*    CBC:  Recent Labs Lab 03/28/15 0527 03/30/15 1607 03/31/15 0500  WBC 10.0 13.5* 16.1*  HGB 9.5* 9.9* 9.5*  HCT 30.0* 31.0* 29.9*  MCV 84.3 83.6 83.5  PLT 237 262 243   Coagulation:  Recent Labs Lab 03/30/15 0607 03/31/15 0500 04/01/15 0615  INR 5.49* 6.09* 8.07*    Radiology: Dg Chest Port 1 View  03/31/2015  CLINICAL DATA:  Leukocytosis. EXAM: PORTABLE CHEST 1 VIEW COMPARISON:  03/25/2015 and 09/16/2012 FINDINGS: Since the prior study the density at the left lung base has progressed with consolidation in the left lower lobe consistent with pneumonia. Chronic cardiomegaly. Calcification in the thoracic aorta. Vascularity is normal. Right lung is clear. No acute osseous abnormality. IMPRESSION: Consolidation in the left lower lobe consistent with pneumonia. Electronically Signed   By: Lorriane Shire M.D.   On: 03/31/2015 18:26     Medications:   Scheduled Medications: . antiseptic oral rinse  7 mL Mouth Rinse BID  . atorvastatin  20 mg Oral QHS  . cholecalciferol  5,000 Units Oral Daily  . citalopram  20 mg Oral QHS  . diltiazem  60 mg Oral 4 times per day  . famotidine  20 mg Oral Daily  . Influenza vac split quadrivalent PF  0.5 mL Intramuscular Tomorrow-1000  . insulin aspart  0-15 Units Subcutaneous TID WC  . insulin aspart  0-5 Units Subcutaneous QHS  . omega-3 acid ethyl esters   Oral Weekly  . pantoprazole  40 mg Oral Daily  . senna-docusate  2 tablet Oral QHS  . sodium  chloride  3 mL Intravenous Q12H  . spironolactone  25 mg Oral Daily  . Warfarin - Pharmacist Dosing Inpatient   Does not apply Q24H    Infusions: . furosemide (LASIX) infusion 8 mg/hr (03/31/15 1019)    PRN Medications: levalbuterol, zolpidem   Assessment and Plan:    1.Acute on Chronic Diastolic CHF: She is on lasix gtt at 8 mg/hr and has diuresed 2.5 liters since admission, 1.6 liters over last 24 hours. She remains on spironolactone as well. Consider one dose of metolazone. Wt is down from highest recorded wt of 323 to 315 this am. She continues to have overall weakness and dyspnea at rest. Creatinine of 1.32 with CO 2 of 26. She may be having some bowel distention with fluid overload, which is causing some abdominal discomfort and early satiety.   2.Atrial fib: Heart rate is well controlled on diltiazem 60 mg Q 6 hours.CHADS VASC Score of 4. She is on coumadin per pharmacy. INR  8.07 this am. Some hematuria noted in foley. Hgb of 9.5 with platelets of 249. Coumadin on hold.  Hx of chronic anemia, iron deficiency.   3. Abdominal pain: May be from bowel distention with fluid overload. She has not had BM but is not eating. Dr. Caryn Section is going to have ultrasound completed.   4. Severe Deconditioning: Plans for OP rehab on discharge.       Phill Myron. Lawrence NP [place]  04/01/2015, 9:29 AM   The patient was seen and examined, and I agree with the assessment and plan as documented above, with modifications as noted below.  Pt tachypneic during my exam. Chest xray from 10/17 shows left lower lobe pneumonia. I will initiate monotherapy with IV levofloxacin 750 mg daily. Appetite is poor as affirmed by husband. Has abdominal pain, Dr. Caryn Section to get imaging today.  With regards to diastolic heart failure, she has a positive fluid balance over last 24 hours if data is accurate. Agree with Dr. Christella Hartigan is no data to support any benefit of Lasix infusion over TID dosing. I will switch  to 40 mg TID and give one dose of metolazone today.  I think her shortness of breath stems from her pneumonia.  Regarding atrial fibrillation and supratherapeutic INR, I will give 2.5 mg vitamin K today.

## 2015-04-01 NOTE — Clinical Social Work Placement (Signed)
   CLINICAL SOCIAL WORK PLACEMENT  NOTE  Date:  04/01/2015  Patient Details  Name: Krystal [name] MRN: [id number] Date of Birth: 1945/10/24  Clinical Social Work is seeking post-discharge placement for this patient at the [place] level of care (*CSW will initial, date and re-position this form in  chart as items are completed):  Yes   Patient/family provided with [place] Work Department's list of facilities offering this level of care within the geographic area requested by the patient (or if unable, by the patient's family).  Yes   Patient/family informed of their freedom to choose among providers that offer the needed level of care, that participate in Medicare, Medicaid or managed care program needed by the patient, have an available bed and are willing to accept the patient.  Yes   Patient/family informed of [place]'s ownership interest in [facility] and [facility], as well as of the fact that they are under no obligation to receive care at these facilities.  PASRR submitted to EDS on 03/31/15     PASRR number received on 03/31/15     Existing PASRR number confirmed on       FL2 transmitted to all facilities in geographic area requested by pt/family on       FL2 transmitted to all facilities within larger geographic area on       Patient informed that his/her managed care company has contracts with or will negotiate with certain facilities, including the following:        Yes   Patient/family informed of bed offers received.  Patient chooses bed at [facility]     Physician recommends and patient chooses bed at      Patient to be transferred to   on  .  Patient to be transferred to facility by       Patient family notified on   of transfer.  Name of family member notified:        PHYSICIAN       Additional Comment:    _______________________________________________ Ihor Gully,  LCSW 04/01/2015, 10:41 AM [phone]

## 2015-04-01 NOTE — Progress Notes (Addendum)
Suspect Patient is in Failure , oxygen increased to nrb, Saturation only 88 on 4lpm/[place] patient respirations are 36. Nurse notified. HX of CHF, atrial fib , Renal, liver damage. Patient also has UTI , Jaundice, WBC 22 from 16

## 2015-04-01 NOTE — Progress Notes (Signed)
Physical Therapy Treatment Patient Details Name: Krystal [name] MRN: [id number] DOB: 1946-05-18 Today's Date: 04/01/2015    History of Present Illness Krystal [name] is an 69 y.o. female with hx of obesity, known afib since 2012, on supratherapeutic Coumadin with INR of 9.81, not bleeding, hx of abnormal nuclear stress test (question of balanced multivessel ischemia), culminated in LHC last month with EF 55 %, and no obstructive coronaries, hx of prior disastolic CHF, DM, HTN, severe non compliance and hadn't taken her medications for " weeks", hx of NASH (Avandia induced cirrhosis), presented to the ER with several weeks of SOB, DOE, worse this past week, and found to be in afib with RVR. Her CXR showed questionable infiltrate, and her EKG showed afib with HR 140. Her troponin was negative. She had a WBC of 15K, and her lactic acid was slightly elevated. Hospitalist was asked to admit her for afib with RVR. She denied fever, coughs, chest pain, orthopnea, PND. She felt that her abdomen was a little larger when asked. It was not painful    PT Comments    Pt was dyspneic at rest at time of PT visit.  She was on 2 L O2 with O2 sat=93%.  We initiated therapeutic exercise in the bed with assistance as needed and frequent rests.  She had difficulty with isometric exercise as she tended to hold her breath so we finally stopped those exercises.  Her O2 sat dropped to 88-90% with very gentle supine exercise so no bed mobility was initiated.  Cardiologist came at end of PT visit and notified pt that she now has pneumonia.  I will ask nursing service to get pt up to a chair using a full lift.  Follow Up Recommendations  SNF     Equipment Recommendations  None recommended by PT    Recommendations for Other Services  OT     Precautions / Restrictions Precautions Precautions: Fall Restrictions Weight Bearing Restrictions: No    Mobility  Bed Mobility               General bed  mobility comments: dypsneic with minimal exertion of exercise in the bed so no bed mobilty or transfers was done  Transfers                    Ambulation/Gait                 Stairs            Wheelchair Mobility    Modified Rankin (Stroke Patients Only)       Balance                                    Cognition Arousal/Alertness: Awake/alert Behavior During Therapy: WFL for tasks assessed/performed Overall Cognitive Status: Within Functional Limits for tasks assessed                      Exercises General Exercises - Lower Extremity Ankle Circles/Pumps: AROM;Both;10 reps;Supine Quad Sets: AROM;Both;5 reps;Supine Gluteal Sets: AROM;Both;5 reps;Supine Short Arc Quad: AAROM;Both;10 reps;Supine Heel Slides: AAROM;Both;10 reps;Supine Hip ABduction/ADduction: AAROM;Both;10 reps;Supine    General Comments        Pertinent Vitals/Pain Pain Assessment: No/denies pain    Home Living                      Prior Function  PT Goals (current goals can now be found in the care plan section) Progress towards PT goals: Not progressing toward goals - comment (pt with increased dyspnea and malaise...is found to have pneumonia)    Frequency  Min 3X/week    PT Plan Current plan remains appropriate    Co-evaluation             End of Session Equipment Utilized During Treatment: Gait belt;Oxygen Activity Tolerance: Patient limited by fatigue Patient left: in bed;with call bell/phone within reach;with family/visitor present     Time: 7543-6067 PT Time Calculation (min) (ACUTE ONLY): 33 min  Charges:  $Therapeutic Exercise: 23-37 mins                    G CodesSable [name]  OT 04/01/2015, 11:25 AM [phone]

## 2015-04-01 NOTE — Clinical Social Work Placement (Signed)
   CLINICAL SOCIAL WORK PLACEMENT  NOTE  Date:  04/01/2015  Patient Details  Name: CALEA HRIBAR MRN: [id number] Date of Birth: 1946-05-02  Clinical Social Work is seeking post-discharge placement for this patient at the [place] level of care (*CSW will initial, date and re-position this form in  chart as items are completed):  Yes   Patient/family provided with [facility] Work Department's list of facilities offering this level of care within the geographic area requested by the patient (or if unable, by the patient's family).  Yes   Patient/family informed of their freedom to choose among providers that offer the needed level of care, that participate in Medicare, Medicaid or managed care program needed by the patient, have an available bed and are willing to accept the patient.  Yes   Patient/family informed of [facility]'s ownership interest in [facility] and [facility], as well as of the fact that they are under no obligation to receive care at these facilities.  PASRR submitted to EDS on 03/31/15     PASRR number received on 03/31/15     Existing PASRR number confirmed on       FL2 transmitted to all facilities in geographic area requested by pt/family on       FL2 transmitted to all facilities within larger geographic area on       Patient informed that his/her managed care company has contracts with or will negotiate with certain facilities, including the following:            Patient/family informed of bed offers received.  Patient chooses bed at       Physician recommends and patient chooses bed at      Patient to be transferred to   on  .  Patient to be transferred to facility by       Patient family notified on   of transfer.  Name of family member notified:        PHYSICIAN       Additional Comment:    _______________________________________________ Ihor Gully, LCSW 04/01/2015, 9:53  AM [phone]

## 2015-04-01 NOTE — Progress Notes (Signed)
[facility] PROGRESS NOTE  Krystal [name] JYN:[id number] DOB: [birth year] DOA: 03/29/2015 PCP: Purvis Kilts, MD    Code Status: Full code Family Communication: Discussed with patient and husband Disposition Plan: Discharge  when clinically appropriate   Consultants:  Cardiology  Procedures: Echocardiogram 03/25/2015: Study Conclusions - Left ventricle: The cavity size was normal. Wall thickness was increased in a pattern of mild LVH. Systolic function was normal. The estimated ejection fraction was in the range of 60% to 65%. Wall motion was normal; there were no regional wall motion abnormalities. The study is not technically sufficient to allow evaluation of LV diastolic function. - Ventricular septum: The contour showed diastolic flattening and systolic flattening. - Aortic valve: Mildly calcified annulus. Trileaflet. - Mitral valve: Calcified annulus. There was trivial regurgitation. - Left atrium: The atrium was mildly dilated. - Right ventricle: The cavity size was severely dilated. Systolic function was reduced - difficult to assess. - Right atrium: The atrium was mildly dilated. - Tricuspid valve: There was moderate regurgitation. Peak RV-RA gradient (S): 65 mm Hg. - Pulmonary arteries: Systolic pressure could not be accurately estimated. - Pericardium, extracardiac: There was no pericardial effusion. Impressions: - Mild LVH with LVEF 60-65%, indeterminate diastolic function in the setting of atrial fibrillation. Septal flatening consistent with RV pressure and volume overload. Mild biatrial enlargement. Mildly sclerotic aortic valve. Severe RV dilatation with somewhat reduced contraction - difficult to assess. Moderate tricuspid regurgitation with suspected severe pulmonary hypertension, RV-RA gradient 65 mmHg. Unable to assess CVP.  Antibiotics:   Levaquin 10/18>>  Vancomycin 10/18>>  Rocephin  10/13>>10/15  HPI/Subjective: Patient acknowledges mild shortness of breath at rest and generalized weakness to the point where she is having difficulty sitting up without tiring out. She denies purulent cough. She has lower abdominal discomfort which she has had on and off for several weeks.  Objective: Filed Vitals:   04/01/15 0822  BP:   Pulse: 98  Temp:   Resp: 18   temperature 97.7. Pulse 98. Respiratory rate 18. Blood pressure 131/91. Oxygen saturation 93% on supplemental oxygen.  Intake/Output Summary (Last 24 hours) at 04/01/15 1221 Last data filed at 04/01/15 0830  Gross per 24 hour  Intake 1481.05 ml  Output   1650 ml  Net -168.95 ml   Filed Weights   03/27/15 0500 03/28/15 0500 04/01/15 0554  Weight: 146.9 kg (323 lb 13.7 oz) 146 kg (321 lb 14 oz) 143.046 kg (315 lb 5.8 oz)    Exam:   General:  Obese ill-appearing 69 year old woman, laying in bed with mild increase work of breathing during conversation.  Cardiovascular: Irregular, irregular   Respiratory: crackles at bases, right greater than left; breathing nonlabored at rest, but slightly labored with conversation.  Abdomen: Obese, positive bowel sounds, nontender; nondistended.  GU: Dark yellow urine query blood-tinged urine in Foley bag.  Musculoskeletal/extremities: 1-2+ nonpitting edema of both lower extremities.  Neurologic: She has a flat affect, but she is alert and oriented 2.  Data Reviewed: Basic Metabolic Panel:  Recent Labs Lab 03/28/15 0527 03/29/15 0602 03/30/15 0607 03/31/15 0500 04/01/15 0615  NA 131* 132* 134* 134* 134*  K 3.2* 3.3* 4.0 4.5 4.2  CL 97* 97* 97* 96* 96*  CO2 24 26 26 27 25   GLUCOSE 156* 156* 170* 177* 165*  BUN 32* 29* 28* 27* 26*  CREATININE 1.42* 1.23* 1.35* 1.30* 1.32*  CALCIUM 8.8* 8.8* 8.9 9.3 9.0   Liver Function Tests:  Recent Labs Lab 04/01/15 0615  AST 29  ALT 7*  ALKPHOS 142*  BILITOT 4.8*  PROT 7.2  ALBUMIN 2.4*   No results for  input(s): LIPASE, AMYLASE in the last 168 hours.  Recent Labs Lab 03/28/15 0527  AMMONIA 26   CBC:  Recent Labs Lab 03/26/15 0527 03/28/15 0527 03/30/15 1607 03/31/15 0500  WBC 11.2* 10.0 13.5* 16.1*  HGB 9.4* 9.5* 9.9* 9.5*  HCT 29.0* 30.0* 31.0* 29.9*  MCV 83.8 84.3 83.6 83.5  PLT 229 237 262 243   Cardiac Enzymes: No results for input(s): CKTOTAL, CKMB, CKMBINDEX, TROPONINI in the last 168 hours. BNP (last 3 results)  Recent Labs  04/06/2015 1954  BNP 422.0*    ProBNP (last 3 results) No results for input(s): PROBNP in the last 8760 hours.  CBG:  Recent Labs Lab 03/31/15 1128 03/31/15 1650 03/31/15 2137 04/01/15 0758 04/01/15 1159  GLUCAP 187* 157* 156* 146* 151*    Recent Results (from the past 240 hour(s))  MRSA PCR Screening     Status: None   Collection Time: 04/02/2015 11:25 PM  Result Value Ref Range Status   MRSA by PCR NEGATIVE NEGATIVE Final    Comment:        The GeneXpert MRSA Assay (FDA approved for NASAL specimens only), is one component of a comprehensive MRSA colonization surveillance program. It is not intended to diagnose MRSA infection nor to guide or monitor treatment for MRSA infections.   Culture, Urine     Status: None   Collection Time: 03/27/15  1:15 PM  Result Value Ref Range Status   Specimen Description URINE, CATHETERIZED  Final   Special Requests NONE  Final   Culture   Final    NO GROWTH 2 DAYS Performed at [facility]    Report Status 03/29/2015 FINAL  Final     Studies: Dg Chest Port 1 View  03/31/2015  CLINICAL DATA:  Leukocytosis. EXAM: PORTABLE CHEST 1 VIEW COMPARISON:  03/20/2015 and 09/16/2012 FINDINGS: Since the prior study the density at the left lung base has progressed with consolidation in the left lower lobe consistent with pneumonia. Chronic cardiomegaly. Calcification in the thoracic aorta. Vascularity is normal. Right lung is clear. No acute osseous abnormality. IMPRESSION: Consolidation  in the left lower lobe consistent with pneumonia. Electronically Signed   By: Lorriane Shire M.D.   On: 03/31/2015 18:26    Scheduled Meds: . antiseptic oral rinse  7 mL Mouth Rinse BID  . atorvastatin  20 mg Oral QHS  . cholecalciferol  5,000 Units Oral Daily  . citalopram  20 mg Oral QHS  . diltiazem  60 mg Oral 4 times per day  . famotidine  20 mg Oral Daily  . furosemide  40 mg Intravenous Q8H  . Influenza vac split quadrivalent PF  0.5 mL Intramuscular Tomorrow-1000  . insulin aspart  0-15 Units Subcutaneous TID WC  . insulin aspart  0-5 Units Subcutaneous QHS  . levofloxacin (LEVAQUIN) IV  750 mg Intravenous Q24H  . metolazone  5 mg Oral Once  . omega-3 acid ethyl esters   Oral Weekly  . pantoprazole  40 mg Oral Daily  . phytonadione  2.5 mg Oral Once  . senna-docusate  2 tablet Oral QHS  . sodium chloride  3 mL Intravenous Q12H  . spironolactone  25 mg Oral Daily  . Warfarin - Pharmacist Dosing Inpatient   Does not apply Q24H   Continuous Infusions:   Assessment and plan:  Principal Problem:   Atrial fibrillation (HCC) Active Problems:  Diastolic CHF, acute on chronic (HCC)   Atrial fibrillation with rapid ventricular response (HCC)   CKD (chronic kidney disease), stage IV (HCC)   Nonalcoholic fatty liver disease   Supratherapeutic INR   HCAP (healthcare-associated pneumonia)   Diabetes mellitus with nephropathy ([place])   OBESITY   Essential hypertension   Long term (current) use of anticoagulants   Hematuria, gross   Acute blood loss anemia   NASH (nonalcoholic steatohepatitis)    1. Acute on chronic diastolic congestive heart failure with superimposed right heart dysfunction and pulmonary hypertension. On admission, on exam, the patient had significant volume overload. She admitted to dietary noncompliance. Patient was started on IV Lasix. Cardiology was consulted and started the patient on a Lasix drip.  Spironolactone was continued. -urine output is  improving. She is -2.8 L thus far. Slow diuresis, but with progressive improvement. -Urine output has stabilized last 24 hours, ranging from - 2.3-2.5 L. -Cardiology follow-up today 10/18. Cardiology discontinued the Lasix infusion in favor of 3 times a day dosing of IV Lasix. One dose of metolazone given today 10/18. Healthcare associated pneumonia. Follow-up chest x-ray was ordered on 10/17 for evaluation of leukocytosis and shortness of breath. It reveals consolidation in the left lower lobe consistent with pneumonia. -Levaquin has been started. Will hold off on vancomycin given her renal insufficiency; but in sitter restarting it if the patient decompensates or does not improve on monotherapy. Chronic atrial fibrillation with RVR on admission. The patient is treated chronically with verapamil and Coumadin. Her INR was supratherapeutic at 9.81. It was held. Verapamil was held and she was started on a diltiazem drip. Cardiology discontinued the diltiazem drip on 10/12 and started her on oral diltiazem, every 6 hours. Echocardiogram results noted above. Her TSH was within normal limits. -Her INR improved and Coumadin was restarted and dosed per pharmacy, but now her INR has trended back up again. Coumadin is being held again per pharmacy. Warfarin coagulopathy/supratherapeutic INR The patient is treated with warfarin for chronic atrial fibrillation. Her INR was supratherapeutic at 9.81on admission. She had mild hematuria. Her hemoglobin has drifted down from 11.8 on admission to 9.4.  Her INR initially decreased to therapeutic range and Coumadin was restarted. Now, it has started trending back up again. We'll hold Coumadin per pharmacy until INR has improved. -Patient probably has underlying coagulopathy from fatty liver disease, so she will likely need significantly less Coumadin when restarted. - No significant bleeding at this time; follow-up urinalysis reveals no RBCs. Anemia, likely secondary to  mild acute blood loss in the setting of anticoagulation. Patient's hemoglobin was 11.8 on admission and has drifted down to 9.4; it has been stable ranging from 9.4-9.9 past several days. She denies bright red blood per rectum or melena, but she did have some hematuria. -No need for packed red blood cell transfusion. We'll continue to monitor her H/H, which has been stable over the past 48 hours. Pepcid started empirically. Hyponatremia. Patient's serum sodium was 131 on admission. This is presumed to be secondary to volume overload. Her serum sodium is improving on diuretic therapy. We'll continue to monitor. Chronic kidney disease, stage 3 to stage IV. Patient's creatinine was 1.62 admission. It has improved to 1.30. Continue to monitor. Diabetes mellitus with nephropathy. Patient is treated chronically with Humalog each morning; it is being held and she is being treated with sliding scale NovoLog. Her CBGs have been reasonable. Nonalcoholic fatty liver disease. She has hyperbilirubinemia, but no significant transaminitis. Abdominal ultrasound on 10/11  revealed small amount of ascites in the right lower quadrant. Spironolactone was restarted on admission. -Her ammonia level was 26 on 10/14. -Her liver disease may be contributing to elevated INR. Her follow-up LFTs remain within normal limits, her total bilirubin has decreased some to 4.8. Subacute/chronic lower abdominal pain. Her lipase and LFTs were unremarkable on admission. Abdominal ultrasound revealed small amount of ascites. Yet she continues to complain of lower abdominal pain. -For further evaluation, will order a noncontrasted CT of her abdomen and pelvis (with oral contrast only). Morbidly obese with deconditioning. Agree with cardiology that she may have obesity hypoventilation syndrome. She will likely need skilled nursing facility placement for long-term rehabilitation. PT evaluated the patient and agreed that patient would benefit  from SNF   Time spent: 30 minutes.    [facility] Pager [phone]. If 7PM-7AM, please contact night-coverage at [email or web address], password [facility] 04/01/2015, 12:21 PM  LOS: 8 days
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
## 2015-04-02 DIAGNOSIS — K7581 Nonalcoholic steatohepatitis (NASH): Secondary | ICD-10-CM

## 2015-04-02 DIAGNOSIS — R188 Other ascites: Secondary | ICD-10-CM

## 2015-04-02 LAB — CBC
HEMATOCRIT: 28.8 % — AB (ref 36.0–46.0)
Hemoglobin: 9.2 g/dL — ABNORMAL LOW (ref 12.0–15.0)
MCH: 26.6 pg (ref 26.0–34.0)
MCHC: 31.9 g/dL (ref 30.0–36.0)
MCV: 83.2 fL (ref 78.0–100.0)
PLATELETS: 223 10*3/uL (ref 150–400)
RBC: 3.46 MIL/uL — AB (ref 3.87–5.11)
RDW: 17.9 % — ABNORMAL HIGH (ref 11.5–15.5)
WBC: 25.4 10*3/uL — AB (ref 4.0–10.5)

## 2015-04-02 LAB — BASIC METABOLIC PANEL
ANION GAP: 13 (ref 5–15)
BUN: 28 mg/dL — AB (ref 6–20)
CHLORIDE: 94 mmol/L — AB (ref 101–111)
CO2: 25 mmol/L (ref 22–32)
Calcium: 9 mg/dL (ref 8.9–10.3)
Creatinine, Ser: 1.4 mg/dL — ABNORMAL HIGH (ref 0.44–1.00)
GFR calc Af Amer: 44 mL/min — ABNORMAL LOW (ref >60)
GFR calc non Af Amer: 38 mL/min — ABNORMAL LOW (ref >60)
GLUCOSE: 152 mg/dL — AB (ref 65–99)
POTASSIUM: 3.8 mmol/L (ref 3.5–5.1)
Sodium: 132 mmol/L — ABNORMAL LOW (ref 135–145)

## 2015-04-02 LAB — PROTIME-INR
INR: 7.25 (ref 0.00–1.49)
Prothrombin Time: 59.4 seconds — ABNORMAL HIGH (ref 11.6–15.2)

## 2015-04-02 LAB — GLUCOSE, CAPILLARY
GLUCOSE-CAPILLARY: 148 mg/dL — AB (ref 65–99)
GLUCOSE-CAPILLARY: 151 mg/dL — AB (ref 65–99)
Glucose-Capillary: 143 mg/dL — ABNORMAL HIGH (ref 65–99)
Glucose-Capillary: 145 mg/dL — ABNORMAL HIGH (ref 65–99)

## 2015-04-02 MED ORDER — VITAMIN K1 10 MG/ML IJ SOLN
5.0000 mg | Freq: Once | INTRAVENOUS | Status: AC
  Administered 2015-04-02: 5 mg via INTRAVENOUS
  Filled 2015-04-02: qty 0.5

## 2015-04-02 MED ORDER — FUROSEMIDE 10 MG/ML IJ SOLN
60.0000 mg | Freq: Three times a day (TID) | INTRAMUSCULAR | Status: DC
  Administered 2015-04-02 – 2015-04-05 (×8): 60 mg via INTRAVENOUS
  Filled 2015-04-02 (×8): qty 6

## 2015-04-02 MED ORDER — VANCOMYCIN HCL 10 G IV SOLR
2500.0000 mg | Freq: Once | INTRAVENOUS | Status: AC
  Administered 2015-04-02: 2500 mg via INTRAVENOUS
  Filled 2015-04-02: qty 2500

## 2015-04-02 MED ORDER — METOLAZONE 5 MG PO TABS
2.5000 mg | ORAL_TABLET | Freq: Once | ORAL | Status: AC
  Administered 2015-04-02: 2.5 mg via ORAL
  Filled 2015-04-02: qty 1
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
## 2015-04-02 NOTE — Progress Notes (Addendum)
Subjective:  Worsening shortness of breath. Transferring to ICU  Objective:  Vital Signs in the last 24 hours: Temp:  [98.2 F (36.8 C)-98.5 F (36.9 C)] 98.4 F (36.9 C) (10/19 0640) Pulse Rate:  [94-108] 108 (10/19 0640) Resp:  [18-24] 18 (10/19 0640) BP: (130-152)/(40-56) 132/48 mmHg (10/19 0640) SpO2:  [88 %-100 %] 93 % (10/19 0741) FiO2 (%):  [100 %] 100 % (10/19 0627) Weight:  [318 lb 6.4 oz (144.425 kg)] 318 lb 6.4 oz (144.425 kg) (10/19 0640)  Intake/Output from previous day: 10/18 0701 - 10/19 0700 In: 510 [P.O.:360; IV Piggyback:150] Out: 2700 [Urine:2700] Intake/Output from this shift:    Physical Exam: NECK: Without JVD, HJR, or bruit LUNGS: Clear anterior, posterior, lateral HEART: Regular rate and rhythm, no murmur, gallop, rub, bruit, thrill, or heave EXTREMITIES: Without cyanosis, clubbing, or edema   Lab Results:  Recent Labs  04/01/15 2204 04/02/15 0542  WBC 22.3* 25.4*  HGB 9.5* 9.2*  PLT 211 223    Recent Labs  04/01/15 2204 04/02/15 0542  NA 131* 132*  K 3.9 3.8  CL 93* 94*  CO2 26 25  GLUCOSE 155* 152*  BUN 28* 28*  CREATININE 1.40* 1.40*   No results for input(s): TROPONINI in the last 72 hours.  Invalid input(s): CK, MB Hepatic Function Panel  Recent Labs  04/01/15 0615  PROT 7.2  ALBUMIN 2.4*  AST 29  ALT 7*  ALKPHOS 142*  BILITOT 4.8*  BILIDIR 2.8*  IBILI 2.0*   No results for input(s): CHOL in the last 72 hours. No results for input(s): PROTIME in the last 72 hours.    Cardiac Studies:  Assessment/Plan:  1.Acute on Chronic Diastolic CHF: She is on lasix 40 mg q 8 hr and received extra  80 mg IV yest.and has diuresed 2190 past 24 hrs. She remains on spironolactone as well.  Weight 323 to 318(315 yest) this am.    2.Atrial fib: Heart rate is well controlled on diltiazem 60 mg Q 6 hours.CHADS VASC Score of 4. Coumadin on hold INR 7.45.     3. Pneumonia still tachypneic. Transferring to ICU  4. Severe  Deconditioning: Plans for OP rehab on discharge   LOS: 9 days    Ermalinda Barrios 04/02/2015, 10:38 AM   The patient was seen and examined, and I agree with the assessment and plan as documented above, with modifications as noted below. Patient's condition has worsened. Developed interval worsening of pneumonia with left pleural effusion and probable collapse with pulmonary edema. Given IV Lasix 80 mg last night. Now on non-rebreather. Did have adequate diuresis (2.2 L in past 24 hrs) and recently had another 400 cc output as per RN. Also has a large amount of ascites contributing to worsening shortness of breath (has NASH). Will give metolazone 2.5 mg x 1 in addition to IV Lasix (increase to 60 mg tid) she is already receiving. Vancomycin has been added due to worsening respiratory status. INR down to 7.25 after 2.5 mg Vit K yesterday. Dr. Roderic Palau has ordered additional Vit K as patient will likely require paracentesis. Discussed with patient, patient's husband, and Dr. Roderic Palau.

## 2015-04-02 NOTE — Progress Notes (Signed)
PT Cancellation Note  Patient Details Name: Krystal [name] MRN: [id number] DOB: 12-07-1945   Cancelled Treatment:    Reason Eval/Treat Not Completed: Medical issues which prohibited therapy.  Pt has had a decline in respiratory status and is now transferred to the ICU.  We will d/c PT service.  Please reorder when medically appropriate.  Thanks.   Sable Feil  PT 04/02/2015, 12:43 PM [phone]

## 2015-04-02 NOTE — Progress Notes (Signed)
Patient's status of breathing changed through the night, requiring her to be placed on  100 percent nonrebreather,however patient is still having labored breathing, per Dr Maurice Small will be transferred to step down ICU bed 06,report was called,and given to [facility]. Husband informed of patient's transfer..Will continue to monitor patient. Staff accompanied patient to awaiting floor.

## 2015-04-02 NOTE — Progress Notes (Signed)
CTSP re desat. She has increase dyspnea and tachypnea. CXR showed developing pulmonary edema. She is anemic, but stable at 9 grams per dL. BMP checked and is OK. Gave 80mg  IV Lasix, and she is better.  Will follow on higher oxygen content.  Thanks.

## 2015-04-02 NOTE — Progress Notes (Signed)
[facility] PROGRESS NOTE  Krystal [name] FIE:[id number] DOB: Jan 28, 1946 DOA: 04/14/2015 PCP: Purvis Kilts, MD  Assessment/Plan: 1. Atrial fibrillation with RVR, rate controlled with oral Cardizem. INR supratherapuetic at 7.25. Will give a small dose of Vit K to reverse INR incase paracentesis is needed.  ECHO as below. TSH wnl.  2. HCAP, seen on CXR 10/17. WBC 25.4, afebrile. Patient started on Levaquin yesterday, will also add Vancomycin since she appears to be worse today. BC pending.  3. Acute on chronic diastolic congestive heart failure with superimposed right heart dysfunction and pulmonary hypertension. Patient was started on a Lasix infusion but has since been transitioned to TID Lasix with Metolozone.  She became increasingly SOB overnight, requiring an extra dose of Lasix 80mg . Cardiology is following.  4. Warfarin coagulopathy/supratherapeutic INR. INR 7.25 today. She will receive a dose of Vitamin K today. Continue to monitor.   5. Acute anemia, likely in the setting of mild acute blood loss secondary to anticoagulation. Hgb stable at 9.4. Continue to monitor.  6. Hyponatremia, improving at 132 today.  7. CKD stage III-IV, unchanged. Creatinine stable at 1.4. Continue to monitor in the setting of diuresis.  8. Acute respiratory failure with hypoxia, currently patient is requiring non-rebreather mask. Suspect this is related to CHF and PNA. May also be related to ascites. Continue to wean off O2 as tolerated.  9. DM type 2, stable. Continue SSI 10. Nonalcoholic fatty liver disease, LFTs from 10/18 reveal hyperbilirubinemia, but no significant transaminitis. Abdominal ultrasound on 10/11 revealed small amount of ascites in the right lower quadrant. CT of the abdomen done on 10/15 shows worsening Ascites. She may need paracentesis. Continue Spironolactone.  11. Subacute/chronic lower abdominal pain, lipase unremarkable. Abdominal CT revealed a large amount of abdominal  ascites ,increased since previous study, with suggestion of loculation. She is on antibiotics. Will try to perform paracentesis once INR is corrected.  12. Morbid obesity. Possibly causing obesity hypoventilation syndrome.   Code Status: Full  DVT prophylaxis SCDs Family Communication: Discussed with patient who understands and has no concerns at this time. Disposition Plan: Move to SDU today. Discharge when improved.    Consultants:  Cardiology  Procedures:  Echocardiogram 03/25/2015: Study Conclusions  - Left ventricle: The cavity size was normal. Wall thickness was increased in a pattern of mild LVH. Systolic function was normal. The estimated ejection fraction was in the range of 60% to 65%. Wall motion was normal; there were no regional wall motion abnormalities. The study is not technically sufficient to allow evaluation of LV diastolic function. - Ventricular septum: The contour showed diastolic flattening and systolic flattening. - Aortic valve: Mildly calcified annulus. Trileaflet. - Mitral valve: Calcified annulus. There was trivial regurgitation. - Left atrium: The atrium was mildly dilated. - Right ventricle: The cavity size was severely dilated. Systolic function was reduced - difficult to assess. - Right atrium: The atrium was mildly dilated. - Tricuspid valve: There was moderate regurgitation. Peak RV-RA gradient (S): 65 mm Hg. - Pulmonary arteries: Systolic pressure could not be accurately estimated. - Pericardium, extracardiac: There was no pericardial effusion.  Antibiotics:  Rocephin 10/13>>10/15  Levaquin 10/18>>  Vancomycin 10/19>>  HPI/Subjective: Feels better with nonrebreather mask on.   Objective: Filed Vitals:   04/02/15 0640  BP: 132/48  Pulse: 108  Temp: 98.4 F (36.9 C)  Resp: 18    Intake/Output Summary (Last 24 hours) at 04/02/15 0932 Last data filed at 04/02/15 0645  Gross per 24 hour  Intake  270 ml   Output   2700 ml  Net  -2430 ml   Filed Weights   03/28/15 0500 04/01/15 0554 04/02/15 0640  Weight: 146 kg (321 lb 14 oz) 143.046 kg (315 lb 5.8 oz) 144.425 kg (318 lb 6.4 oz)    Exam:  General: NAD, on non-rebreather mask Cardiovascular: RRR, S1, S2  Respiratory: Diminished breath sounds bilaterally, No wheezing, rales or rhonchi Abdomen: Distended, soft, non tender, bowel sounds normal Musculoskeletal: 2+ LE edema bilaterally  Data Reviewed: Basic Metabolic Panel:  Recent Labs Lab 03/30/15 0607 03/31/15 0500 04/01/15 0615 04/01/15 2204 04/02/15 0542  NA 134* 134* 134* 131* 132*  K 4.0 4.5 4.2 3.9 3.8  CL 97* 96* 96* 93* 94*  CO2 26 27 25 26 25   GLUCOSE 170* 177* 165* 155* 152*  BUN 28* 27* 26* 28* 28*  CREATININE 1.35* 1.30* 1.32* 1.40* 1.40*  CALCIUM 8.9 9.3 9.0 9.0 9.0   Liver Function Tests:  Recent Labs Lab 04/01/15 0615  AST 29  ALT 7*  ALKPHOS 142*  BILITOT 4.8*  PROT 7.2  ALBUMIN 2.4*       Recent Labs Lab 03/28/15 0527  AMMONIA 26   CBC:  Recent Labs Lab 03/28/15 0527 03/30/15 1607 03/31/15 0500 04/01/15 2204 04/02/15 0542  WBC 10.0 13.5* 16.1* 22.3* 25.4*  HGB 9.5* 9.9* 9.5* 9.5* 9.2*  HCT 30.0* 31.0* 29.9* 29.4* 28.8*  MCV 84.3 83.6 83.5 82.8 83.2  PLT 237 262 243 211 223    BNP (last 3 results)  Recent Labs  03/31/2015 1954  BNP 422.0*     CBG:  Recent Labs Lab 04/01/15 0758 04/01/15 1159 04/01/15 1642 04/01/15 2156 04/02/15 0721  GLUCAP 146* 151* 154* 156* 143*    Recent Results (from the past 240 hour(s))  MRSA PCR Screening     Status: None   Collection Time: 04/05/2015 11:25 PM  Result Value Ref Range Status   MRSA by PCR NEGATIVE NEGATIVE Final    Comment:        The GeneXpert MRSA Assay (FDA approved for NASAL specimens only), is one component of a comprehensive MRSA colonization surveillance program. It is not intended to diagnose MRSA infection nor to guide or monitor treatment for MRSA  infections.   Culture, Urine     Status: None   Collection Time: 03/27/15  1:15 PM  Result Value Ref Range Status   Specimen Description URINE, CATHETERIZED  Final   Special Requests NONE  Final   Culture   Final    NO GROWTH 2 DAYS Performed at [facility]    Report Status 03/29/2015 FINAL  Final     Studies: Ct Abdomen Pelvis Wo Contrast  04/01/2015  CLINICAL DATA:  Oral contrast only; abdominal pain and swelling since 03/21/15; chf; diabetes^ EXAM: CT ABDOMEN AND PELVIS WITHOUT CONTRAST TECHNIQUE: Multidetector CT imaging of the abdomen and pelvis was performed following the standard protocol without IV contrast. COMPARISON:  Ultrasound 03/25/2015 FINDINGS: There is a large amount of abdominal ascites. In the upper abdomen the fluid is largely loculated to the left, and in the pelvis fluid is largely anterior and on the right. Small pleural effusions left greater than right. Consolidation/ atelectasis in the visualized lung bases left worse than right. Calcified granulomas in the spleen and liver. High attenuation in the dependent aspect of the nondilated gallbladder suggesting small partially calcified stones. Kidneys, adrenal glands, pancreas unremarkable. Unenhanced CT was performed per clinician order. Lack of IV contrast limits sensitivity  and specificity, especially for evaluation of abdominal/pelvic solid viscera. Stomach, small bowel, and colon are nondilated. Scattered distal descending and sigmoid diverticula without adjacent inflammatory/edematous change. Urinary bladder decompressed by Foley catheter. No free air. A few retroperitoneal aortocaval nodes. Moderate aortoiliac arterial calcifications without aneurysm. Lumbar spine unremarkable. IMPRESSION: 1. Large amount of abdominal ascites ,increased since previous study, with suggestion of loculation. 2. Small pleural effusions with adjacent atelectasis/consolidation, left worse than right. 3. Cholelithiasis. 4. Descending and  sigmoid diverticulosis. Electronically Signed   By: Lucrezia Europe M.D.   On: 04/01/2015 16:46   Dg Chest 1 View  04/01/2015  CLINICAL DATA:  Increasing shortness of breath tonight. EXAM: CHEST 1 VIEW COMPARISON:  Chest radiograph yesterday. Included portion from CT abdomen earlier this day. FINDINGS: Progressive volume loss in the left hemithorax with increase in left pleural effusion and retrocardiac opacity. Cardiomegaly is grossly unchanged. The small right pleural effusion on prior CT is not well seen. Development pulmonary edema. Ill-defined airspace opacity at the right lung base, likely atelectasis. IMPRESSION: Increased left pleural effusion with volume loss in the left hemithorax, likely progressed atelectasis/collapse. Development of pulmonary edema. Electronically Signed   By: Jeb Levering M.D.   On: 04/01/2015 22:31   Dg Chest Port 1 View  03/31/2015  CLINICAL DATA:  Leukocytosis. EXAM: PORTABLE CHEST 1 VIEW COMPARISON:  04/12/2015 and 09/16/2012 FINDINGS: Since the prior study the density at the left lung base has progressed with consolidation in the left lower lobe consistent with pneumonia. Chronic cardiomegaly. Calcification in the thoracic aorta. Vascularity is normal. Right lung is clear. No acute osseous abnormality. IMPRESSION: Consolidation in the left lower lobe consistent with pneumonia. Electronically Signed   By: Lorriane Shire M.D.   On: 03/31/2015 18:26    Scheduled Meds: . antiseptic oral rinse  7 mL Mouth Rinse BID  . atorvastatin  20 mg Oral QHS  . cholecalciferol  5,000 Units Oral Daily  . citalopram  20 mg Oral QHS  . diltiazem  60 mg Oral 4 times per day  . famotidine  20 mg Oral Daily  . furosemide  40 mg Intravenous Q8H  . Influenza vac split quadrivalent PF  0.5 mL Intramuscular Tomorrow-1000  . insulin aspart  0-15 Units Subcutaneous TID WC  . insulin aspart  0-5 Units Subcutaneous QHS  . levofloxacin (LEVAQUIN) IV  750 mg Intravenous Q24H  . omega-3 acid  ethyl esters   Oral Weekly  . pantoprazole  40 mg Oral Daily  . senna-docusate  2 tablet Oral QHS  . sodium chloride  3 mL Intravenous Q12H  . spironolactone  25 mg Oral Daily  . Warfarin - Pharmacist Dosing Inpatient   Does not apply Q24H   Continuous Infusions:   Principal Problem:   Atrial fibrillation (HCC) Active Problems:   Diabetes mellitus with nephropathy (HCC)   OBESITY   Essential hypertension   Diastolic CHF, acute on chronic (HCC)   Long term (current) use of anticoagulants   CKD (chronic kidney disease), stage IV (HCC)   Nonalcoholic fatty liver disease   Atrial fibrillation with rapid ventricular response (HCC)   Hematuria, gross   Acute blood loss anemia   Supratherapeutic INR   NASH (nonalcoholic steatohepatitis)   HCAP (healthcare-associated pneumonia)    Time spent: 20 minutes  Jehanzeb Memon. MD [facility] Pager [phone]. If 7PM-7AM, please contact night-coverage at [email or web address], password [facility] 04/02/2015, 9:32 AM  LOS: 9 days      By signing my name below, I, Anderson Malta  Gregorio, attest that this documentation has been prepared under the direction and in the presence of [employer]. MD Electronically Signed: Rosalie Doctor, Scribe. 04/02/2015 9:20am   I, Dr. Kathie Dike, personally performed the services described in this documentaiton. All medical record entries made by the scribe were at my direction and in my presence. I have reviewed the chart and agree that the record reflects my personal performance and is accurate and complete  Kathie Dike, MD, 04/02/2015 9:34 AM

## 2015-04-02 NOTE — Progress Notes (Signed)
Patient's  PT/INR 59.4, and 7.25 Dr Roderic Palau notified. Will continue to monitor patient.Marland Kitchen

## 2015-04-02 NOTE — Progress Notes (Signed)
OT Cancellation Note  Patient Details Name: Krystal [name] MRN: [id number] DOB: 04-25-1946   Cancelled Treatment:      Reason Eval/Treat Not Completed: Medical issues which prohibited therapy. Pt has been transferred to ICU due to a decline in respiratory status. We will d/c OT service, please reorder when medically appropriate.   Guadelupe Sabin, OTR/L  [phone]  04/02/2015, 12:54 PM

## 2015-04-03 ENCOUNTER — Inpatient Hospital Stay (HOSPITAL_COMMUNITY): Payer: Medicare Other

## 2015-04-03 DIAGNOSIS — D72829 Elevated white blood cell count, unspecified: Secondary | ICD-10-CM

## 2015-04-03 DIAGNOSIS — J9601 Acute respiratory failure with hypoxia: Secondary | ICD-10-CM | POA: Diagnosis present

## 2015-04-03 LAB — BASIC METABOLIC PANEL
ANION GAP: 13 (ref 5–15)
BUN: 31 mg/dL — ABNORMAL HIGH (ref 6–20)
CALCIUM: 8.7 mg/dL — AB (ref 8.9–10.3)
CO2: 25 mmol/L (ref 22–32)
Chloride: 95 mmol/L — ABNORMAL LOW (ref 101–111)
Creatinine, Ser: 1.53 mg/dL — ABNORMAL HIGH (ref 0.44–1.00)
GFR calc non Af Amer: 34 mL/min — ABNORMAL LOW (ref >60)
GFR, EST AFRICAN AMERICAN: 39 mL/min — AB (ref >60)
Glucose, Bld: 144 mg/dL — ABNORMAL HIGH (ref 65–99)
Potassium: 3.6 mmol/L (ref 3.5–5.1)
Sodium: 133 mmol/L — ABNORMAL LOW (ref 135–145)

## 2015-04-03 LAB — GLUCOSE, CAPILLARY
GLUCOSE-CAPILLARY: 147 mg/dL — AB (ref 65–99)
GLUCOSE-CAPILLARY: 131 mg/dL — AB (ref 65–99)
GLUCOSE-CAPILLARY: 177 mg/dL — AB (ref 65–99)
Glucose-Capillary: 140 mg/dL — ABNORMAL HIGH (ref 65–99)

## 2015-04-03 LAB — CBC
HCT: 28.3 % — ABNORMAL LOW (ref 36.0–46.0)
HEMOGLOBIN: 8.9 g/dL — AB (ref 12.0–15.0)
MCH: 26.3 pg (ref 26.0–34.0)
MCHC: 31.4 g/dL (ref 30.0–36.0)
MCV: 83.5 fL (ref 78.0–100.0)
Platelets: 218 10*3/uL (ref 150–400)
RBC: 3.39 MIL/uL — AB (ref 3.87–5.11)
RDW: 17.9 % — ABNORMAL HIGH (ref 11.5–15.5)
WBC: 26.3 10*3/uL — ABNORMAL HIGH (ref 4.0–10.5)

## 2015-04-03 LAB — PROTIME-INR
INR: 2.26 — AB (ref 0.00–1.49)
PROTHROMBIN TIME: 24.7 s — AB (ref 11.6–15.2)

## 2015-04-03 MED ORDER — LEVOFLOXACIN IN D5W 750 MG/150ML IV SOLN
750.0000 mg | INTRAVENOUS | Status: DC
  Administered 2015-04-04: 750 mg via INTRAVENOUS
  Filled 2015-04-03 (×2): qty 150

## 2015-04-03 MED ORDER — GLUCERNA SHAKE PO LIQD
237.0000 mL | Freq: Two times a day (BID) | ORAL | Status: DC
  Administered 2015-04-03 – 2015-04-05 (×4): 237 mL via ORAL

## 2015-04-03 MED ORDER — PRO-STAT SUGAR FREE PO LIQD
30.0000 mL | Freq: Two times a day (BID) | ORAL | Status: DC
  Administered 2015-04-03 – 2015-04-07 (×7): 30 mL via ORAL
  Filled 2015-04-03 (×7): qty 30

## 2015-04-03 MED ORDER — LEVALBUTEROL HCL 0.63 MG/3ML IN NEBU
0.6300 mg | INHALATION_SOLUTION | Freq: Three times a day (TID) | RESPIRATORY_TRACT | Status: DC
  Administered 2015-04-03 – 2015-04-06 (×8): 0.63 mg via RESPIRATORY_TRACT
  Filled 2015-04-03 (×8): qty 3

## 2015-04-03 MED ORDER — METHYLPREDNISOLONE SODIUM SUCC 40 MG IJ SOLR
40.0000 mg | Freq: Two times a day (BID) | INTRAMUSCULAR | Status: DC
  Administered 2015-04-03 – 2015-04-06 (×7): 40 mg via INTRAVENOUS
  Filled 2015-04-03 (×8): qty 1

## 2015-04-03 MED ORDER — SODIUM CHLORIDE 0.9 % IV SOLN
1500.0000 mg | INTRAVENOUS | Status: DC
  Administered 2015-04-03 – 2015-04-05 (×3): 1500 mg via INTRAVENOUS
  Filled 2015-04-03 (×5): qty 1500

## 2015-04-03 MED ORDER — VITAMIN K1 10 MG/ML IJ SOLN
2.0000 mg | Freq: Once | INTRAVENOUS | Status: AC
  Administered 2015-04-03: 2 mg via INTRAVENOUS
  Filled 2015-04-03: qty 0.2

## 2015-04-03 NOTE — Consult Note (Signed)
Krystal [name], Krystal [name]             ACCOUNT NO.:  [id number]  MEDICAL RECORD NO.:  [id number]  LOCATION:  IC06                          FACILITY:  [facility]  PHYSICIAN:  Leeya Rusconi L. Luan Pulling, M.D.DATE OF BIRTH:  30-Aug-1945  DATE OF CONSULTATION: DATE OF DISCHARGE:                                CONSULTATION   REASON FOR CONSULTATION:  Respiratory failure.  HISTORY:  This is a 69 year old, who was admitted to the hospital on October 10, with increasing shortness of breath.  She was noted to have pneumonia at that time.  She was also found to have atrial fibrillation with rapid ventricular response.  She has had multiple other medical problems including chest pain, chronic atrial fibrillation, on chronic Coumadin therapy, cirrhosis of the liver with ascites.  She had done well initially, then she developed increasing problems with increasing shortness of breath.  She eventually ended up having to use non- rebreather mask and was transferred to the intensive care unit.  She remains on a non-rebreather mask and says that she feels okay.  PAST MEDICAL HISTORY:  Positive for hypertension, arthritis, glaucoma, heart failure, obesity, diabetes, uterine cancer, diastolic heart failure, chronic atrial fibrillation, depression, cirrhosis of the liver.  PAST SURGICAL HISTORY:  Surgically, she has had a hysterectomy, D and C, basal cell carcinoma exclusion, left enucleation, abdominal hysterectomy, colonoscopy, and cardiac catheterization.  MEDICATIONS:  I reviewed her medications both from home and in the hospital.  ALLERGIES:  SHE IS ALLERGIC TO ACE INHIBITORS, ACTOS, ASPIRIN, CELEBREX, DIOVAN, METFORMIN, AND TYLENOL.  SOCIAL HISTORY:  She is a nonsmoker.  Lives at home with her husband. She does not use any alcohol or illicit drugs.  FAMILY HISTORY:  Positive for hypertension and heart failure in her mother.  Diabetes in her father.  PHYSICAL EXAMINATION:  GENERAL:  She is a well-developed,  moderately obese female, who is in no acute distress, but is lying flat and using a 100% non-rebreather mask. HEENT:  Her nose and throat are clear. NECK:  Supple. CHEST:  Clear without wheezes, but somewhat diminished breath sounds. HEART:  Irregular without gallop. ABDOMEN:  Soft.  No masses are felt. EXTREMITIES:  No edema. CENTRAL NERVOUS SYSTEM:  Grossly intact.  IMAGING STUDIES:  Chest x-ray shows that she has a pleural effusion and has ascites based on CT.  ASSESSMENT AND PLAN:  Because she is still anticoagulated, we were waiting on her clotting to be a little bit better.  I think she may need to have thoracentesis, but since this is probably communicating, it may be best to go ahead with paracentesis first and see how she responds.  I have ordered another chest x-ray in the morning and try to decide at that point.  I have added steroids because of her pneumonia.  I have a scheduled levalbuterol treatments.  Otherwise, I do not think there is anything to add.  Thanks for allowing me to see her with you.     Musette Kisamore L. Luan Pulling, M.D.     ELH/MEDQ  D:  04/03/2015  T:  04/03/2015  Job:  [id number]

## 2015-04-03 NOTE — Care Management Note (Signed)
Case Management Note  Patient Details  Name: Krystal [name] MRN: [id number] Date of Birth: 1946/02/13  Subjective/Objective:                    Action/Plan:   Expected Discharge Date:  03/27/15               Expected Discharge Plan:  Skilled Nursing Facility  In-House Referral:  Clinical Social Work  Discharge planning Services  CM Consult  Post Acute Care Choice:  Home Health Choice offered to:  Patient  DME Arranged:    DME Agency:     HH Arranged:    [place] Agency:     Status of Service:  In process, will continue to follow  Medicare Important Message Given:    Date Medicare IM Given:    Medicare IM give by:    Date Additional Medicare IM Given:    Additional Medicare Important Message give by:     If discussed at [place] of Stay Meetings, dates discussed:  04/03/2015  Additional Comments:  Sherald Barge, RN 04/03/2015, 2:01 PM
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
## 2015-04-03 NOTE — Progress Notes (Signed)
Consulting cardiologist: Kate Sable MD Primary Cardiologist: Kate Sable MD  Cardiology Specific Problem List: 1. Diastolic CHF-Pulmonary Edema  2. Chronic Atrial fib  Subjective:    Generalized fatigue. Breathing some better. No chest pain. Abdominal discomfort.  Objective:   Temp:  [97.5 F (36.4 C)-98 F (36.7 C)] 97.8 F (36.6 C) (10/20 0400) Pulse Rate:  [30-113] 88 (10/20 0500) Resp:  [19-35] 19 (10/20 0500) BP: (124-153)/(51-73) 136/63 mmHg (10/20 0500) SpO2:  [92 %-96 %] 96 % (10/20 0500) FiO2 (%):  [100 %] 100 % (10/19 1100) Weight:  [307 lb 15.7 oz (139.7 kg)-309 lb 15.5 oz (140.6 kg)] 309 lb 15.5 oz (140.6 kg) (10/20 0500) Last BM Date: 04/01/15  Filed Weights   04/02/15 0640 04/02/15 1230 04/03/15 0500  Weight: 318 lb 6.4 oz (144.425 kg) 307 lb 15.7 oz (139.7 kg) 309 lb 15.5 oz (140.6 kg)    Intake/Output Summary (Last 24 hours) at 04/03/15 0746 Last data filed at 04/03/15 0500  Gross per 24 hour  Intake    320 ml  Output   1825 ml  Net  -1505 ml    Telemetry: Atrial fib   Exam:  General: No acute distress.  HEENT: Conjunctiva and lids normal, oropharynx clear. Icteric.   Lungs: Clear to auscultation, nonlabored in the upper lobes. Diminished in the bases. No wheezes   Cardiac: Positive elevated JVP or bruits. IRRR, no gallop or rub.   Abdomen: Normoactive bowel sounds, tender, distended.  Extremities: No pitting edema, distal pulses full.  Neuropsychiatric: Alert and oriented x3, affect appropriate.   Lab Results:  Basic Metabolic Panel:  Recent Labs Lab 04/01/15 2204 04/02/15 0542 04/03/15 0407  NA 131* 132* 133*  K 3.9 3.8 3.6  CL 93* 94* 95*  CO2 26 25 25   GLUCOSE 155* 152* 144*  BUN 28* 28* 31*  CREATININE 1.40* 1.40* 1.53*  CALCIUM 9.0 9.0 8.7*    Liver Function Tests:  Recent Labs Lab 04/01/15 0615  AST 29  ALT 7*  ALKPHOS 142*  BILITOT 4.8*  PROT 7.2  ALBUMIN 2.4*    CBC:  Recent  Labs Lab 04/01/15 2204 04/02/15 0542 04/03/15 0407  WBC 22.3* 25.4* 26.3*  HGB 9.5* 9.2* 8.9*  HCT 29.4* 28.8* 28.3*  MCV 82.8 83.2 83.5  PLT 211 223 218      Coagulation:  Recent Labs Lab 04/01/15 0615 04/02/15 0542 04/03/15 0407  INR 8.07* 7.25* 2.26*    Radiology: Ct Abdomen Pelvis Wo Contrast  04/01/2015  CLINICAL DATA:  Oral contrast only; abdominal pain and swelling since 03/21/15; chf; diabetes^ EXAM: CT ABDOMEN AND PELVIS WITHOUT CONTRAST TECHNIQUE: Multidetector CT imaging of the abdomen and pelvis was performed following the standard protocol without IV contrast. COMPARISON:  Ultrasound 03/25/2015 FINDINGS: There is a large amount of abdominal ascites. In the upper abdomen the fluid is largely loculated to the left, and in the pelvis fluid is largely anterior and on the right. Small pleural effusions left greater than right. Consolidation/ atelectasis in the visualized lung bases left worse than right. Calcified granulomas in the spleen and liver. High attenuation in the dependent aspect of the nondilated gallbladder suggesting small partially calcified stones. Kidneys, adrenal glands, pancreas unremarkable. Unenhanced CT was performed per clinician order. Lack of IV contrast limits sensitivity and specificity, especially for evaluation of abdominal/pelvic solid viscera. Stomach, small bowel, and colon are nondilated. Scattered distal descending and sigmoid diverticula without adjacent inflammatory/edematous change. Urinary bladder decompressed by Foley catheter. No free air. A  few retroperitoneal aortocaval nodes. Moderate aortoiliac arterial calcifications without aneurysm. Lumbar spine unremarkable. IMPRESSION: 1. Large amount of abdominal ascites ,increased since previous study, with suggestion of loculation. 2. Small pleural effusions with adjacent atelectasis/consolidation, left worse than right. 3. Cholelithiasis. 4. Descending and sigmoid diverticulosis. Electronically  Signed   By: Lucrezia Europe M.D.   On: 04/01/2015 16:46   Dg Chest 1 View  04/01/2015  CLINICAL DATA:  Increasing shortness of breath tonight. EXAM: CHEST 1 VIEW COMPARISON:  Chest radiograph yesterday. Included portion from CT abdomen earlier this day. FINDINGS: Progressive volume loss in the left hemithorax with increase in left pleural effusion and retrocardiac opacity. Cardiomegaly is grossly unchanged. The small right pleural effusion on prior CT is not well seen. Development pulmonary edema. Ill-defined airspace opacity at the right lung base, likely atelectasis. IMPRESSION: Increased left pleural effusion with volume loss in the left hemithorax, likely progressed atelectasis/collapse. Development of pulmonary edema. Electronically Signed   By: Jeb Levering M.D.   On: 04/01/2015 22:31    Medications:   Scheduled Medications: . antiseptic oral rinse  7 mL Mouth Rinse BID  . atorvastatin  20 mg Oral QHS  . cholecalciferol  5,000 Units Oral Daily  . citalopram  20 mg Oral QHS  . diltiazem  60 mg Oral 4 times per day  . famotidine  20 mg Oral Daily  . furosemide  60 mg Intravenous Q8H  . Influenza vac split quadrivalent PF  0.5 mL Intramuscular Tomorrow-1000  . insulin aspart  0-15 Units Subcutaneous TID WC  . insulin aspart  0-5 Units Subcutaneous QHS  . levofloxacin (LEVAQUIN) IV  750 mg Intravenous Q24H  . omega-3 acid ethyl esters   Oral Weekly  . pantoprazole  40 mg Oral Daily  . senna-docusate  2 tablet Oral QHS  . sodium chloride  3 mL Intravenous Q12H  . spironolactone  25 mg Oral Daily  . Warfarin - Pharmacist Dosing Inpatient   Does not apply Q24H      PRN Medications: levalbuterol, zolpidem   Assessment and Plan:   1. Atrial fib:  Heart rate elevated but without rapid rates. She is no longer on coumadin due to supra therapeutic INR, has been given vitamin K. INR now 2.26. Continue diltiazem.   2. Acute Diastolic CHF with pleural effusion: She has had increased doses  of lasix and had also been given metolazone. She continues on spironolactone. She has diuresed 6.285 liters, with 1.8 liters overnight. Breathing is some better. Creatinine 1.53, Na. 133, with CO2 25. No significant LEE.   Planned for paracentesis once INR is more therapeutic. Will continue to diurese with IV lasix 60 mg TID.   3. Anemia: Hgb down to 8.9 from 9.2 and 9.5 respectively. She has some hematuria. Likely from elevated INR over the last few days. Now more therapeutic. Will continue to monitor this. No need for transfusion at this time. Consider another dose of vitamin K.   4. Pneumonia: She is on vancomycin. Ascites contributing to respiration depth. Breathing better on venti mask.   5. NASH: Ascites prominent. Paracentesis planned when INR is therapeutic.      Phill Myron. Lawrence NP [place]  04/03/2015, 7:46 AM   The patient was seen and examined, and I agree with the assessment and plan as documented above, with modifications as noted below. Pt appears more stable from a respiratory status today. Persistent leukocytosis. Diuresed 3.7 L in last 48 hours on IV Lasix 60 mg tid with 2.5 mg  metolazone on 10/18-19 and spironolactone. BUN and creatinine gradually trending up. Will not give metolazone today. Also has a large amount of ascites contributing to shortness of breath (has NASH). INR 2.26 today, paracentesis planned for 10/21. On appropriate antibiotic therapy (I started Levaquin 10/18, vancomycin added by Dr. Roderic Palau).

## 2015-04-03 NOTE — Progress Notes (Signed)
Initial Nutrition Assessment  DOCUMENTATION CODES:  Morbid obesity, Non-severe (moderate) malnutrition in context of acute illness/injury  Pt meets criteria for MODERATE MALNUTRITION in the context of Acute Illness as evidenced by Mod-severe fluid accumulation and an oral intake that met <75% of estimated needs for > 7 days.  INTERVENTION:  Glucerna Shake po BID, each supplement provides 220 kcal and 10 grams of protein  60 ml Prostat daily, each 30 mls provides 100 kcal, 15 g Pro  NUTRITION DIAGNOSIS:  Inadequate oral intake related to poor appetite, acute illness, dyspnea  as evidenced by energy intake < 75% for > 7 days.  GOAL:  Patient will meet greater than or equal to 90% of their needs  MONITOR:  PO intake, Supplement acceptance, Diet advancement, Labs, Weight trends, I & O's  REASON FOR ASSESSMENT: LOS    ASSESSMENT:  69 y.o. 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   Has decompensated during stay. Additional problems now: HCAP, CKD, Acute resp failure, worsening liver disease w/ ascites.   Seeing pt for LOS of 10 days. She states that PTA she had absolutely no appetite for 4 days.  She denies any n/v/c/d.   During admision,  EMR documentation shows she had her last meal on 10/18. Prior to that she would have days where she ate well and days she did not. Recently, She states that she has been having a lot of trouble breathing. This morning the nurse attempted to feed her, but as soon as she removed the mask her "breathing dropped" and the mask had to immediatly be placed back on. She is planned to undergo paracentesis tomorrow which will hopefull make her breathing better.   She was agreeable to supplements when she is able to breathe better.   Pt states her normal weight is 250 lbs, but she has an extreme  amount of fluid on her at the moment. Per past wt hx, It does not appear she has weighed that in the recent past.   NFPE: No noticeable fat/muscle wasting but exam is very limited due to moderate to severe fluid accumulation  Diet Order:  Diet Carb Modified Fluid consistency:: Thin; Room service appropriate?: Yes Diet NPO time specified  Skin:  Ecchymosis, MSAD Groin  Last BM:  10/18  Height:  Ht Readings from Last 1 Encounters:  04/02/15 5' 7"  (1.702 m)   Weight:  Wt Readings from Last 1 Encounters:  04/03/15 309 lb 15.5 oz (140.6 kg)   Wt Readings from Last 10 Encounters:  04/03/15 309 lb 15.5 oz (140.6 kg)  02/12/15 290 lb (131.543 kg)  01/30/15 278 lb (126.1 kg)  07/29/14 276 lb (125.193 kg)  05/27/14 280 lb (127.007 kg)  10/18/13 256 lb (116.121 kg)  04/09/13 248 lb (112.492 kg)  01/08/13 242 lb (109.77 kg)  10/14/12 266 lb (120.657 kg)  10/05/12 274 lb (124.286 kg)  Admit/dosing wt: 290 lbs   Ideal Body Weight:  61.36 kg  BMI:  Body mass index is 48.54 kg/(m^2).  Estimated Nutritional Needs:  Kcal:  1600-1850 (11-13 kcal/kg dosing wt) Protein:  80-92 g Pro (1.3-1.5 g/kg IBW) Fluid:  1.6-1.9 liters fluid  EDUCATION NEEDS:  No education needs identified at this time  Burtis Junes RD, LDN Nutrition Pager: [phone] 04/03/2015 3:53 PM

## 2015-04-03 NOTE — Progress Notes (Signed)
[facility] PROGRESS NOTE  Krystal [name] IHK:[id number] DOB: Apr 22, 1946 DOA: 03/27/2015 PCP: Purvis Kilts, MD  Assessment/Plan: 1. Atrial fibrillation with RVR, rate controlled with oral Cardizem. INR supratherapuetic at 2.26 from 7.25. Given a small dose of Vit K to reverse INR, since she will need paracentesis. ECHO as below.  2. HCAP, seen on CXR 10/17. Moderate leukocytosis yet remains afebrile. Will continue Levaquin and Vanc.BC are still pending.  3. Acute on chronic diastolic congestive heart failure with superimposed right heart dysfunction and pulmonary hypertension. Cardiology is following. She currentyy on IV Lasix but UOP has been unimpressive. Further adjustment of diuretics per cardiology.  4. Warfarin coagulopathy/supratherapeutic INR. INR 2.26 today. She received a dose of Vitamin K. Continue to monitor.   5. Acute anemia, likely in the setting of mild acute blood loss secondary to anticoagulation. Hgb stable at 8.9. Will transfuse if dropped below 7. No evidence of ongoing bleeding. Continue to monitor.  6. Hyponatremia, continues to improve.   7. CKD stage III-IV, slight increase, Creatinine trended up at 1.53. Will continue to monitor in the setting of diuresis.  8. Acute respiratory failure with hypoxia, currently patient is requiring non-rebreather mask. Suspect this is related to CHF and PNA. May also be related to ascites. Will continue to wean off O2 as tolerated. Will consult pulmonology.  9. DM type 2, remains stable. Will continue SSI 10. Nonalcoholic fatty liver disease, LFTs from 10/18 reveal hyperbilirubinemia, but no significant transaminitis. Abdominal ultrasound on 10/11 revealed small amount of ascites in the right lower quadrant. CT of the abdomen done on 10/15 shows worsening Ascites. Will continue to correct INR to perform paracentesis, hopefully 10/21. Continue Spironolactone. 11. Subacute/chronic lower abdominal pain, lipase unremarkable.  Abdominal CT revealed a large amount of abdominal ascites ,increased since previous study, with suggestion of loculation. She is on antibiotics. Will try to perform paracentesis once INR is corrected.  12. Morbid obesity. Possibly causing obesity hypoventilation syndrome. 13. Leukocytosis. Felt to be related to PNA although she has remained afebrile, is not having any diarrhea, and BC are in process. Continue current abx.   Code Status: Full  DVT prophylaxis SCDs Family Communication: Husband bedside. Discussed plans with patient and spouse who understands and have no concerns at this time. Disposition Plan: Anticipate discharge within 2-3 days.   Consultants:  Cardiology  Procedures:  Echocardiogram 03/25/2015: Study Conclusions  - Left ventricle: The cavity size was normal. Wall thickness was increased in a pattern of mild LVH. Systolic function was normal. The estimated ejection fraction was in the range of 60% to 65%. Wall motion was normal; there were no regional wall motion abnormalities. The study is not technically sufficient to allow evaluation of LV diastolic function. - Ventricular septum: The contour showed diastolic flattening and systolic flattening. - Aortic valve: Mildly calcified annulus. Trileaflet. - Mitral valve: Calcified annulus. There was trivial regurgitation. - Left atrium: The atrium was mildly dilated. - Right ventricle: The cavity size was severely dilated. Systolic function was reduced - difficult to assess. - Right atrium: The atrium was mildly dilated. - Tricuspid valve: There was moderate regurgitation. Peak RV-RA gradient (S): 65 mm Hg. - Pulmonary arteries: Systolic pressure could not be accurately estimated. - Pericardium, extracardiac: There was no pericardial effusion.  Antibiotics:  Rocephin 10/13>>10/15  Levaquin 10/18>>  Vancomycin 10/19>>  HPI/Subjective: Feeling a little better. Mild non-productive cough. Mild  LLQ pain, denies diarrhea or vomiting.   Objective: Filed Vitals:   04/03/15 0500  BP: 136/63  Pulse: 88  Temp:   Resp: 19    Intake/Output Summary (Last 24 hours) at 04/03/15 0620 Last data filed at 04/03/15 0500  Gross per 24 hour  Intake    320 ml  Output   3425 ml  Net  -3105 ml   Filed Weights   04/02/15 0640 04/02/15 1230 04/03/15 0500  Weight: 144.425 kg (318 lb 6.4 oz) 139.7 kg (307 lb 15.7 oz) 140.6 kg (309 lb 15.5 oz)    Exam:  General: NAD, Lying in bed with non rebreather masks yet looks comfortable.  Cardiovascular: irregularly irregular, S1, S2   Respiratory: clear bilaterally, No wheezing, rales or rhonchi  Abdomen: soft, non tender, distention , bowel sounds normal  Musculoskeletal:1-2+  edema b/l  Data Reviewed: Basic Metabolic Panel:  Recent Labs Lab 03/31/15 0500 04/01/15 0615 04/01/15 2204 04/02/15 0542 04/03/15 0407  NA 134* 134* 131* 132* 133*  K 4.5 4.2 3.9 3.8 3.6  CL 96* 96* 93* 94* 95*  CO2 27 25 26 25 25   GLUCOSE 177* 165* 155* 152* 144*  BUN 27* 26* 28* 28* 31*  CREATININE 1.30* 1.32* 1.40* 1.40* 1.53*  CALCIUM 9.3 9.0 9.0 9.0 8.7*   Liver Function Tests:  Recent Labs Lab 04/01/15 0615  AST 29  ALT 7*  ALKPHOS 142*  BILITOT 4.8*  PROT 7.2  ALBUMIN 2.4*       Recent Labs Lab 03/28/15 0527  AMMONIA 26   CBC:  Recent Labs Lab 03/30/15 1607 03/31/15 0500 04/01/15 2204 04/02/15 0542 04/03/15 0407  WBC 13.5* 16.1* 22.3* 25.4* 26.3*  HGB 9.9* 9.5* 9.5* 9.2* 8.9*  HCT 31.0* 29.9* 29.4* 28.8* 28.3*  MCV 83.6 83.5 82.8 83.2 83.5  PLT 262 243 211 223 218    BNP (last 3 results)  Recent Labs  03/19/2015 1954  BNP 422.0*     CBG:  Recent Labs Lab 04/01/15 2156 04/02/15 0721 04/02/15 1221 04/02/15 1627 04/02/15 2107  GLUCAP 156* 143* 148* 151* 145*    Recent Results (from the past 240 hour(s))  MRSA PCR Screening     Status: None   Collection Time: 03/23/2015 11:25 PM  Result Value Ref Range  Status   MRSA by PCR NEGATIVE NEGATIVE Final    Comment:        The GeneXpert MRSA Assay (FDA approved for NASAL specimens only), is one component of a comprehensive MRSA colonization surveillance program. It is not intended to diagnose MRSA infection nor to guide or monitor treatment for MRSA infections.   Culture, Urine     Status: None   Collection Time: 03/27/15  1:15 PM  Result Value Ref Range Status   Specimen Description URINE, CATHETERIZED  Final   Special Requests NONE  Final   Culture   Final    NO GROWTH 2 DAYS Performed at [facility]    Report Status 03/29/2015 FINAL  Final     Studies: Ct Abdomen Pelvis Wo Contrast  04/01/2015  CLINICAL DATA:  Oral contrast only; abdominal pain and swelling since 03/21/15; chf; diabetes^ EXAM: CT ABDOMEN AND PELVIS WITHOUT CONTRAST TECHNIQUE: Multidetector CT imaging of the abdomen and pelvis was performed following the standard protocol without IV contrast. COMPARISON:  Ultrasound 03/25/2015 FINDINGS: There is a large amount of abdominal ascites. In the upper abdomen the fluid is largely loculated to the left, and in the pelvis fluid is largely anterior and on the right. Small pleural effusions left greater than right. Consolidation/ atelectasis in the visualized lung bases  left worse than right. Calcified granulomas in the spleen and liver. High attenuation in the dependent aspect of the nondilated gallbladder suggesting small partially calcified stones. Kidneys, adrenal glands, pancreas unremarkable. Unenhanced CT was performed per clinician order. Lack of IV contrast limits sensitivity and specificity, especially for evaluation of abdominal/pelvic solid viscera. Stomach, small bowel, and colon are nondilated. Scattered distal descending and sigmoid diverticula without adjacent inflammatory/edematous change. Urinary bladder decompressed by Foley catheter. No free air. A few retroperitoneal aortocaval nodes. Moderate aortoiliac  arterial calcifications without aneurysm. Lumbar spine unremarkable. IMPRESSION: 1. Large amount of abdominal ascites ,increased since previous study, with suggestion of loculation. 2. Small pleural effusions with adjacent atelectasis/consolidation, left worse than right. 3. Cholelithiasis. 4. Descending and sigmoid diverticulosis. Electronically Signed   By: Lucrezia Europe M.D.   On: 04/01/2015 16:46   Dg Chest 1 View  04/01/2015  CLINICAL DATA:  Increasing shortness of breath tonight. EXAM: CHEST 1 VIEW COMPARISON:  Chest radiograph yesterday. Included portion from CT abdomen earlier this day. FINDINGS: Progressive volume loss in the left hemithorax with increase in left pleural effusion and retrocardiac opacity. Cardiomegaly is grossly unchanged. The small right pleural effusion on prior CT is not well seen. Development pulmonary edema. Ill-defined airspace opacity at the right lung base, likely atelectasis. IMPRESSION: Increased left pleural effusion with volume loss in the left hemithorax, likely progressed atelectasis/collapse. Development of pulmonary edema. Electronically Signed   By: Jeb Levering M.D.   On: 04/01/2015 22:31    Scheduled Meds: . antiseptic oral rinse  7 mL Mouth Rinse BID  . atorvastatin  20 mg Oral QHS  . cholecalciferol  5,000 Units Oral Daily  . citalopram  20 mg Oral QHS  . diltiazem  60 mg Oral 4 times per day  . famotidine  20 mg Oral Daily  . furosemide  60 mg Intravenous Q8H  . Influenza vac split quadrivalent PF  0.5 mL Intramuscular Tomorrow-1000  . insulin aspart  0-15 Units Subcutaneous TID WC  . insulin aspart  0-5 Units Subcutaneous QHS  . levofloxacin (LEVAQUIN) IV  750 mg Intravenous Q24H  . omega-3 acid ethyl esters   Oral Weekly  . pantoprazole  40 mg Oral Daily  . senna-docusate  2 tablet Oral QHS  . sodium chloride  3 mL Intravenous Q12H  . spironolactone  25 mg Oral Daily  . Warfarin - Pharmacist Dosing Inpatient   Does not apply Q24H    Continuous Infusions:   Principal Problem:   Atrial fibrillation (HCC) Active Problems:   Diabetes mellitus with nephropathy (HCC)   OBESITY   Essential hypertension   Diastolic CHF, acute on chronic (HCC)   Long term (current) use of anticoagulants   CKD (chronic kidney disease), stage IV (HCC)   Nonalcoholic fatty liver disease   Atrial fibrillation with rapid ventricular response (HCC)   Hematuria, gross   Acute blood loss anemia   Supratherapeutic INR   NASH (nonalcoholic steatohepatitis)   HCAP (healthcare-associated pneumonia)   Ascites    Time spent: 20 minutes  Geovana Gebel. MD [facility] Pager [phone]. If 7PM-7AM, please contact night-coverage at [email or web address], password [facility] 04/03/2015, 6:20 AM  LOS: 10 days       By signing my name below, I, Rennis Harding, attest that this documentation has been prepared under the direction and in the presence of Kathie Dike, MD. Electronically signed: Rennis Harding, Scribe. 04/03/2015 9:19 AM.     I, Dr. Kathie Dike, personally performed the services described in this  documentaiton. All medical record entries made by the scribe were at my direction and in my presence. I have reviewed the chart and agree that the record reflects my personal performance and is accurate and complete  Kathie Dike, MD, 04/03/2015 9:53 AM

## 2015-04-04 ENCOUNTER — Inpatient Hospital Stay (HOSPITAL_COMMUNITY): Payer: Medicare Other

## 2015-04-04 ENCOUNTER — Ambulatory Visit: Payer: Medicare Other | Admitting: Cardiovascular Disease

## 2015-04-04 DIAGNOSIS — E440 Moderate protein-calorie malnutrition: Secondary | ICD-10-CM | POA: Diagnosis present

## 2015-04-04 DIAGNOSIS — J90 Pleural effusion, not elsewhere classified: Secondary | ICD-10-CM | POA: Diagnosis present

## 2015-04-04 LAB — BODY FLUID CELL COUNT WITH DIFFERENTIAL
EOS FL: 0 %
Lymphs, Fluid: 55 %
MONOCYTE-MACROPHAGE-SEROUS FLUID: 1 % — AB (ref 50–90)
Neutrophil Count, Fluid: 44 % — ABNORMAL HIGH (ref 0–25)
OTHER CELLS FL: 0 %
Total Nucleated Cell Count, Fluid: 3754 cu mm — ABNORMAL HIGH (ref 0–1000)

## 2015-04-04 LAB — CBC
HEMATOCRIT: 29.2 % — AB (ref 36.0–46.0)
HEMOGLOBIN: 9.4 g/dL — AB (ref 12.0–15.0)
MCH: 27 pg (ref 26.0–34.0)
MCHC: 32.2 g/dL (ref 30.0–36.0)
MCV: 83.9 fL (ref 78.0–100.0)
Platelets: 280 10*3/uL (ref 150–400)
RBC: 3.48 MIL/uL — ABNORMAL LOW (ref 3.87–5.11)
RDW: 18.4 % — ABNORMAL HIGH (ref 11.5–15.5)
WBC: 35.9 10*3/uL — AB (ref 4.0–10.5)

## 2015-04-04 LAB — BASIC METABOLIC PANEL
ANION GAP: 17 — AB (ref 5–15)
BUN: 37 mg/dL — ABNORMAL HIGH (ref 6–20)
CHLORIDE: 94 mmol/L — AB (ref 101–111)
CO2: 22 mmol/L (ref 22–32)
Calcium: 8.9 mg/dL (ref 8.9–10.3)
Creatinine, Ser: 1.63 mg/dL — ABNORMAL HIGH (ref 0.44–1.00)
GFR calc non Af Amer: 31 mL/min — ABNORMAL LOW (ref >60)
GFR, EST AFRICAN AMERICAN: 36 mL/min — AB (ref >60)
GLUCOSE: 200 mg/dL — AB (ref 65–99)
POTASSIUM: 4.1 mmol/L (ref 3.5–5.1)
Sodium: 133 mmol/L — ABNORMAL LOW (ref 135–145)

## 2015-04-04 LAB — GLUCOSE, SEROUS FLUID: GLUCOSE FL: 193 mg/dL

## 2015-04-04 LAB — PROTEIN, BODY FLUID: TOTAL PROTEIN, FLUID: 3.7 g/dL

## 2015-04-04 LAB — LACTATE DEHYDROGENASE, PLEURAL OR PERITONEAL FLUID: LD, Fluid: 222 U/L — ABNORMAL HIGH (ref 3–23)

## 2015-04-04 LAB — GLUCOSE, CAPILLARY
GLUCOSE-CAPILLARY: 177 mg/dL — AB (ref 65–99)
GLUCOSE-CAPILLARY: 197 mg/dL — AB (ref 65–99)
Glucose-Capillary: 181 mg/dL — ABNORMAL HIGH (ref 65–99)
Glucose-Capillary: 196 mg/dL — ABNORMAL HIGH (ref 65–99)

## 2015-04-04 LAB — PROTIME-INR
INR: 1.75 — AB (ref 0.00–1.49)
Prothrombin Time: 20.5 seconds — ABNORMAL HIGH (ref 11.6–15.2)

## 2015-04-04 MED ORDER — LEVALBUTEROL HCL 0.63 MG/3ML IN NEBU
0.6300 mg | INHALATION_SOLUTION | RESPIRATORY_TRACT | Status: DC | PRN
  Filled 2015-04-04: qty 3

## 2015-04-04 NOTE — Progress Notes (Signed)
Consulting cardiologist: Jenkins Rouge MD Primary Cardiologist: Kate Sable MD  Cardiology Specific Problem List: 1. Diastolic CHF-Pulmonary Edema 2. Chronic Atrial fib  Subjective:   Remains dyspneic.    Objective:   Temp:  [97 F (36.1 C)-98.2 F (36.8 C)] 97.7 F (36.5 C) (10/21 0410) Pulse Rate:  [61-103] 100 (10/21 0800) Resp:  [19-34] 26 (10/21 0800) BP: (126-169)/(50-140) 149/65 mmHg (10/21 0800) SpO2:  [86 %-96 %] 91 % (10/21 0800) FiO2 (%):  [60 %] 60 % (10/21 0800) Weight:  [310 lb 13.6 oz (141 kg)] 310 lb 13.6 oz (141 kg) (10/21 0410) Last BM Date: 04/03/15  Filed Weights   04/02/15 1230 04/03/15 0500 04/04/15 0410  Weight: 307 lb 15.7 oz (139.7 kg) 309 lb 15.5 oz (140.6 kg) 310 lb 13.6 oz (141 kg)    Intake/Output Summary (Last 24 hours) at 04/04/15 0813 Last data filed at 04/04/15 0410  Gross per 24 hour  Intake    500 ml  Output   1150 ml  Net   -650 ml    Telemetry: Atrial fib rates in the 90's.   Exam:  General:Ill appearing,   HEENT: Conjunctiva and lids normal, oropharynx clear.  Lungs: Diminished in the bases with poor inspiratory effort.   Cardiac: Elevated JVP or bruits. IRRR, no gallop or rub.   Abdomen: Normoactive bowel sounds, significantly distended.   Neuropsychiatric: Alert and oriented x3, affect appropriate.   Lab Results:  Basic Metabolic Panel:  Recent Labs Lab 04/02/15 0542 04/03/15 0407 04/04/15 0415  NA 132* 133* 133*  K 3.8 3.6 4.1  CL 94* 95* 94*  CO2 25 25 22   GLUCOSE 152* 144* 200*  BUN 28* 31* 37*  CREATININE 1.40* 1.53* 1.63*  CALCIUM 9.0 8.7* 8.9    Liver Function Tests:  Recent Labs Lab 04/01/15 0615  AST 29  ALT 7*  ALKPHOS 142*  BILITOT 4.8*  PROT 7.2  ALBUMIN 2.4*    CBC:  Recent Labs Lab 04/02/15 0542 04/03/15 0407 04/04/15 0415  WBC 25.4* 26.3* 35.9*  HGB 9.2* 8.9* 9.4*  HCT 28.8* 28.3* 29.2*  MCV 83.2 83.5 83.9  PLT 223 218 280    Coagulation:  Recent  Labs Lab 04/02/15 0542 04/03/15 0407 04/04/15 0415  INR 7.25* 2.26* 1.75*    Radiology: Dg Chest Port 1 View  04/04/2015  CLINICAL DATA:  Respiratory distress. EXAM: PORTABLE CHEST 1 VIEW COMPARISON:  04/01/2015. FINDINGS: Patient is rotated to the left. On today's exam there is opacification of the entire left hemi thorax. Findings consistent with left lung atelectasis and/or large left pleural effusion. Underlying left lung infiltrate cannot be excluded . Diffuse mild infiltrate present throughout the right lung. No pneumothorax. IMPRESSION: 1. On today's exam there is complete opacification of the left hemi thorax consistent with left lung atelectasis and/or large left pleural effusion. Underlying left lung infiltrate cannot be excluded . 2. Diffuse mild infiltrate right lung. Electronically Signed   By: [place]   On: 04/04/2015 07:47     Medications:   Scheduled Medications: . antiseptic oral rinse  7 mL Mouth Rinse BID  . atorvastatin  20 mg Oral QHS  . cholecalciferol  5,000 Units Oral Daily  . citalopram  20 mg Oral QHS  . diltiazem  60 mg Oral 4 times per day  . famotidine  20 mg Oral Daily  . feeding supplement (GLUCERNA SHAKE)  237 mL Oral BID BM  . feeding supplement (PRO-STAT SUGAR FREE 64)  30  mL Oral BID  . furosemide  60 mg Intravenous Q8H  . Influenza vac split quadrivalent PF  0.5 mL Intramuscular Tomorrow-1000  . insulin aspart  0-15 Units Subcutaneous TID WC  . insulin aspart  0-5 Units Subcutaneous QHS  . levalbuterol  0.63 mg Nebulization Q8H  . levofloxacin (LEVAQUIN) IV  750 mg Intravenous Q48H  . methylPREDNISolone (SOLU-MEDROL) injection  40 mg Intravenous Q12H  . omega-3 acid ethyl esters   Oral Weekly  . pantoprazole  40 mg Oral Daily  . senna-docusate  2 tablet Oral QHS  . sodium chloride  3 mL Intravenous Q12H  . spironolactone  25 mg Oral Daily  . vancomycin  1,500 mg Intravenous Q24H      PRN Medications: levalbuterol,  zolpidem   Assessment and Plan:    1. Atrial fib: Heart rate elevated today. She continues dyspneic. Coumadin on hold due to supratherapuetic INR . Received vitamin K. INR now 1.75. To have paracentesis and thoracentesis today per radiology.   2. Diastolic CHF: Large pleural effusion as well on the left. For thoracentesis today. Continue IV lasix for now. Total urine output 6.9 liters. 1.150 overnight. Still with significant edema.   3. 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   Phill Myron. Lawrence NP [place]  04/04/2015, 8:13 AM   Patient examined chart reviewed.  Agree with mechanical fluid drainage to aid diuresis.  Primary issue is pulmonary hypertension and obesity hypoventilation syndrome.  Would start heparin post fluid drainage and resume coumadin Respiratory status still tenous.   Jenkins Rouge

## 2015-04-04 NOTE — Care Management Important Message (Signed)
Important Message  Patient Details  Name: Krystal [name] MRN: [id number] Date of Birth: 29-Nov-1945   Medicare Important Message Given:  Yes-second notification given    Sherald Barge, RN 04/04/2015, 9:07 AM

## 2015-04-04 NOTE — Care Management Note (Signed)
Case Management Note  Patient Details  Name: VYLA PINT MRN: [id number] Date of Birth: [birth year]  Expected Discharge Date:  03/27/15               Expected Discharge Plan:  Skilled Nursing Facility  In-House Referral:  Clinical Social Work  Discharge planning Services  CM Consult  Post Acute Care Choice:  Home Health Choice offered to:  Patient  DME Arranged:    DME Agency:     HH Arranged:    [place] Agency:     Status of Service:  In process, will continue to follow  Medicare Important Message Given:  Yes-second notification given Date Medicare IM Given:    Medicare IM give by:    Date Additional Medicare IM Given:    Additional Medicare Important Message give by:     If discussed at [place] of Stay Meetings, dates discussed:    Additional Comments: DC not expected over weekend, will cont to follow.  Sherald Barge, RN 04/04/2015, 9:07 AM

## 2015-04-04 NOTE — Clinical Documentation Improvement (Signed)
Internal Medicine  Can the diagnosis of systemic infection be further specified?   Sepsis - specify causative organism if known} due to pneumonia  Other clinical explanation  Clinically Unable to determine at this time.  Document any associated diagnoses/conditions.   Supporting Information: Pt originally admitted with atrial Fib.  On admission Lactic acid was 2.3 and went done to 1.6.  WBC at the time was 15.7. Heart rate was 132 and respirations were 26.   As of 03/30/15 WBC has been increasing from 13.5 to today at 35.9.  No repeat lactic acid has been drawn. Pt. now diagnosed with pneumonia and scheduled to have thoracentesis on 04/04/15.   Please exercise your independent, professional judgment when responding. A specific answer is not anticipated or expected.   Thank You,  [place] [phone]

## 2015-04-04 NOTE — Progress Notes (Signed)
[facility] PROGRESS NOTE  Krystal [name] QMV:[id number] DOB: [birth year] DOA: 04/08/2015 PCP: Purvis Kilts, MD  Summary: 69 y/o female was admitted to the hospital with SOB. Found to have acute on chronic diastolic congested heart failure with rapid atrial fibrillation. She has been seen by cardiology and has been started on diuretics with slow progress. Her respiratory status has declined since admission and it is felt that she has worsening pleural effusion and ascites. She may also have an underlying PNA. She is on abx. Plan is for thoracentesis/paracentesis today. If the patient fails to improve over the weekend, we need to consider transfer to a higher level of care.   Assessment/Plan: 1. Atrial fibrillation with RVR, rate controlled with oral Cardizem. Following small dose of Vitamin K,  INR stable at 1.75 from 2.26.  ECHO as below. Will need to start on IV Heparin after procedures today.  2. HCAP, seen on CXR 10/17. Worsening leukocytosis yet remains afebrile. Will continue Levaquin and Vanc. BC reveal no growth to date however final results are still pending.  3. Acute on chronic diastolic congestive heart failure with superimposed right heart dysfunction and pulmonary hypertension. Cardiology is following. She is currently on IV Lasix. Further adjustment of diuretics per cardiology. Plans are for thoracentesis/paracentesis today. 4. Warfarin coagulopathy/supratherapeutic INR. INR 1.75 today following a dose of Vitamin K. Continue to monitor.   5. Acute anemia, likely in the setting of mild acute blood loss secondary to anticoagulation. Hgb stable at 9.4. Will transfuse if drops below 7. No evidence of ongoing bleeding. Continue to monitor.  6. Hyponatremia, continues to improve.   7. CKD stage III-IV, slight increase, Creatinine trended up at 1.63. Will continue to monitor in the setting of diuresis.  8. Acute respiratory failure with hypoxia, currently patient is requiring  non-rebreather mask. Suspect this is related to CHF and PNA. May also be related to ascites. Will continue to wean off O2 as tolerated. Pulmonology following and recommends thoracentesis today.  9. DM type 2, remains stable. Will continue SSI 10. Nonalcoholic fatty liver disease, LFTs from 10/18 reveal hyperbilirubinemia, but no significant transaminitis. Abdominal ultrasound on 10/11 revealed small amount of ascites in the right lower quadrant. CT of the abdomen done on 10/15 shows worsening ascites. INR stable at 1.75 so will move forward with paracentesis today. Continue Spironolactone. 11. Subacute/chronic lower abdominal pain, lipase unremarkable. Abdominal CT revealed a large amount of abdominal ascites ,increased since previous study, with suggestion of loculation. She is on antibiotics. Will plan for paracentesis today.  12. Morbid obesity. Possibly causing obesity hypoventilation syndrome. 13. Leukocytosis. Felt to be related to PNA although she has remained afebrile, is not having any diarrhea, and BC are in process. Continue current abx. She was also started on steroids yesterday.   Code Status: Full  DVT prophylaxis SCDs Family Communication:  Family at bedside. Discussed with patient who understands and has no concerns at this time. Disposition Plan: Discharge when improved. Discussed with Dr. Luan Pulling, if patient does not improve over the weekend, she may need transfer to [facility] for further care.    Consultants:  Cardiology  Pulmonology  Procedures:  Echocardiogram 03/25/2015: Study Conclusions  - Left ventricle: The cavity size was normal. Wall thickness was increased in a pattern of mild LVH. Systolic function was normal. The estimated ejection fraction was in the range of 60% to 65%. Wall motion was normal; there were no regional wall motion abnormalities. The study is not technically sufficient to allow evaluation  of LV diastolic function. - Ventricular septum:  The contour showed diastolic flattening and systolic flattening. - Aortic valve: Mildly calcified annulus. Trileaflet. - Mitral valve: Calcified annulus. There was trivial regurgitation. - Left atrium: The atrium was mildly dilated. - Right ventricle: The cavity size was severely dilated. Systolic function was reduced - difficult to assess. - Right atrium: The atrium was mildly dilated. - Tricuspid valve: There was moderate regurgitation. Peak RV-RA gradient (S): 65 mm Hg. - Pulmonary arteries: Systolic pressure could not be accurately estimated. - Pericardium, extracardiac: There was no pericardial effusion.  Antibiotics:  Rocephin 10/13>>10/15  Levaquin 10/18>>  Vancomycin 10/19>>  HPI/Subjective: Breathing is about the same. Abdominal pain and LE swelling is improved.   Objective: Filed Vitals:   04/04/15 0623  BP: 150/59  Pulse:   Temp:   Resp:     Intake/Output Summary (Last 24 hours) at 04/04/15 0718 Last data filed at 04/04/15 0410  Gross per 24 hour  Intake    500 ml  Output   1150 ml  Net   -650 ml   Filed Weights   04/02/15 1230 04/03/15 0500 04/04/15 0410  Weight: 139.7 kg (307 lb 15.7 oz) 140.6 kg (309 lb 15.5 oz) 141 kg (310 lb 13.6 oz)    Exam:  General: NAD, Lying in bed with non rebreather mask. Appears mildly uncomfortable.  Cardiovascular: irregularly irregular, S1, S2  Respiratory: Crackles at bases, diminished breath sounds on the left. No wheezing  or rhonchi Abdomen: soft, non tender, no distention , bowel sounds normal Musculoskeletal: 1+ LE edema bilaterally  Data Reviewed: Basic Metabolic Panel:  Recent Labs Lab 04/01/15 0615 04/01/15 2204 04/02/15 0542 04/03/15 0407 04/04/15 0415  NA 134* 131* 132* 133* 133*  K 4.2 3.9 3.8 3.6 4.1  CL 96* 93* 94* 95* 94*  CO2 25 26 25 25 22   GLUCOSE 165* 155* 152* 144* 200*  BUN 26* 28* 28* 31* 37*  CREATININE 1.32* 1.40* 1.40* 1.53* 1.63*  CALCIUM 9.0 9.0 9.0 8.7* 8.9   Liver  Function Tests:  Recent Labs Lab 04/01/15 0615  AST 29  ALT 7*  ALKPHOS 142*  BILITOT 4.8*  PROT 7.2  ALBUMIN 2.4*    CBC:  Recent Labs Lab 03/31/15 0500 04/01/15 2204 04/02/15 0542 04/03/15 0407 04/04/15 0415  WBC 16.1* 22.3* 25.4* 26.3* 35.9*  HGB 9.5* 9.5* 9.2* 8.9* 9.4*  HCT 29.9* 29.4* 28.8* 28.3* 29.2*  MCV 83.5 82.8 83.2 83.5 83.9  PLT 243 211 223 218 280    BNP (last 3 results)  Recent Labs  04/03/2015 1954  BNP 422.0*     CBG:  Recent Labs Lab 04/02/15 2107 04/03/15 0721 04/03/15 1125 04/03/15 1613 04/03/15 2116  GLUCAP 145* 147* 131* 140* 177*    Recent Results (from the past 240 hour(s))  Culture, Urine     Status: None   Collection Time: 03/27/15  1:15 PM  Result Value Ref Range Status   Specimen Description URINE, CATHETERIZED  Final   Special Requests NONE  Final   Culture   Final    NO GROWTH 2 DAYS Performed at [facility]    Report Status 03/29/2015 FINAL  Final  Culture, blood (routine x 2)     Status: None (Preliminary result)   Collection Time: 04/02/15  9:40 AM  Result Value Ref Range Status   Specimen Description BLOOD LEFT HAND  Final   Special Requests BOTTLES DRAWN AEROBIC ONLY [place]  Final   Culture NO GROWTH 2  DAYS  Final   Report Status PENDING  Incomplete  Culture, blood (routine x 2)     Status: None (Preliminary result)   Collection Time: 04/02/15  9:47 AM  Result Value Ref Range Status   Specimen Description BLOOD RIGHT HAND  Final   Special Requests BOTTLES DRAWN AEROBIC ONLY 6CC  Final   Culture NO GROWTH 2 DAYS  Final   Report Status PENDING  Incomplete    Scheduled Meds: . antiseptic oral rinse  7 mL Mouth Rinse BID  . atorvastatin  20 mg Oral QHS  . cholecalciferol  5,000 Units Oral Daily  . citalopram  20 mg Oral QHS  . diltiazem  60 mg Oral 4 times per day  . famotidine  20 mg Oral Daily  . feeding supplement (GLUCERNA SHAKE)  237 mL Oral BID BM  . feeding supplement (PRO-STAT SUGAR FREE 64)   30 mL Oral BID  . furosemide  60 mg Intravenous Q8H  . Influenza vac split quadrivalent PF  0.5 mL Intramuscular Tomorrow-1000  . insulin aspart  0-15 Units Subcutaneous TID WC  . insulin aspart  0-5 Units Subcutaneous QHS  . levalbuterol  0.63 mg Nebulization Q8H  . levofloxacin (LEVAQUIN) IV  750 mg Intravenous Q48H  . methylPREDNISolone (SOLU-MEDROL) injection  40 mg Intravenous Q12H  . omega-3 acid ethyl esters   Oral Weekly  . pantoprazole  40 mg Oral Daily  . senna-docusate  2 tablet Oral QHS  . sodium chloride  3 mL Intravenous Q12H  . spironolactone  25 mg Oral Daily  . vancomycin  1,500 mg Intravenous Q24H   Continuous Infusions:   Principal Problem:   Acute respiratory failure with hypoxia (HCC) Active Problems:   Diabetes mellitus with nephropathy (HCC)   OBESITY   Essential hypertension   Atrial fibrillation (HCC)   Diastolic CHF, acute on chronic (HCC)   Long term (current) use of anticoagulants   CKD (chronic kidney disease), stage IV (HCC)   Nonalcoholic fatty liver disease   Atrial fibrillation with rapid ventricular response (HCC)   Hematuria, gross   Acute blood loss anemia   Supratherapeutic INR   NASH (nonalcoholic steatohepatitis)   HCAP (healthcare-associated pneumonia)   Ascites   Leukocytosis    Time spent: 20 minutes  Deeana Atwater. MD [facility] Pager [phone]. If 7PM-7AM, please contact night-coverage at [email or web address], password [facility] 04/04/2015, 7:18 AM  LOS: 11 days    By signing my name below, I, Rosalie Doctor, attest that this documentation has been prepared under the direction and in the presence of [employer]. MD Electronically Signed: Rosalie Doctor, Scribe.  04/04/2015 8:55am  I, Dr. Kathie Dike, personally performed the services described in this documentaiton. All medical record entries made by the scribe were at my direction and in my presence. I have reviewed the chart and agree that the record reflects my  personal performance and is accurate and complete  Kathie Dike, MD, 04/04/2015 9:17 AM

## 2015-04-04 NOTE — Clinical Social Work Note (Signed)
CSW updated PNC on pt. She remains in ICU on non-rebreather. Will discuss with SNF early next week and begin insurance authorization when appropriate.  Benay Pike, [place]

## 2015-04-04 NOTE — Progress Notes (Signed)
Subjective: She says she is having a little more shortness of breath. She thinks it's because she's been moving around. Her chest x-ray this morning shows essentially complete white out of the left lung which I think is because of pleural effusion. She remains on a nonrebreather mask.  Objective: Vital signs in last 24 hours: Temp:  [97 F (36.1 C)-98.2 F (36.8 C)] 97.7 F (36.5 C) (10/21 0410) Pulse Rate:  [61-103] 100 (10/21 0800) Resp:  [19-34] 26 (10/21 0800) BP: (126-169)/(50-140) 149/65 mmHg (10/21 0800) SpO2:  [86 %-96 %] 91 % (10/21 0800) FiO2 (%):  [60 %] 60 % (10/21 0827) Weight:  [141 kg (310 lb 13.6 oz)] 141 kg (310 lb 13.6 oz) (10/21 0410) Weight change: 1.3 kg (2 lb 13.9 oz) Last BM Date: 04/03/15  Intake/Output from previous day: 10/20 0701 - 10/21 0700 In: 500 [IV Piggyback:500] Out: 1150 [Urine:1150]  PHYSICAL EXAM General appearance: alert, moderate distress and morbidly obese Resp: Rhonchi on the right markedly decreased breath sounds on the left Cardio: irregularly irregular rhythm GI: soft, non-tender; bowel sounds normal; no masses,  no organomegaly Extremities: extremities normal, atraumatic, no cyanosis or edema  Lab Results:  Results for orders placed or performed during the hospital encounter of 04/12/2015 (from the past 48 hour(s))  Culture, blood (routine x 2)     Status: None (Preliminary result)   Collection Time: 04/02/15  9:40 AM  Result Value Ref Range   Specimen Description BLOOD LEFT HAND    Special Requests BOTTLES DRAWN AEROBIC ONLY 6CC    Culture NO GROWTH 2 DAYS    Report Status PENDING   Culture, blood (routine x 2)     Status: None (Preliminary result)   Collection Time: 04/02/15  9:47 AM  Result Value Ref Range   Specimen Description BLOOD RIGHT HAND    Special Requests BOTTLES DRAWN AEROBIC ONLY 6CC    Culture NO GROWTH 2 DAYS    Report Status PENDING   Glucose, capillary     Status: Abnormal   Collection Time: 04/02/15 12:21  PM  Result Value Ref Range   Glucose-Capillary 148 (H) 65 - 99 mg/dL  Glucose, capillary     Status: Abnormal   Collection Time: 04/02/15  4:27 PM  Result Value Ref Range   Glucose-Capillary 151 (H) 65 - 99 mg/dL  Glucose, capillary     Status: Abnormal   Collection Time: 04/02/15  9:07 PM  Result Value Ref Range   Glucose-Capillary 145 (H) 65 - 99 mg/dL   Comment 1 Notify RN   Protime-INR     Status: Abnormal   Collection Time: 04/03/15  4:07 AM  Result Value Ref Range   Prothrombin Time 24.7 (H) 11.6 - 15.2 seconds   INR 2.26 (H) 0.00 - 2.29  Basic metabolic panel     Status: Abnormal   Collection Time: 04/03/15  4:07 AM  Result Value Ref Range   Sodium 133 (L) 135 - 145 mmol/L   Potassium 3.6 3.5 - 5.1 mmol/L   Chloride 95 (L) 101 - 111 mmol/L   CO2 25 22 - 32 mmol/L   Glucose, Bld 144 (H) 65 - 99 mg/dL   BUN 31 (H) 6 - 20 mg/dL   Creatinine, Ser 1.53 (H) 0.44 - 1.00 mg/dL   Calcium 8.7 (L) 8.9 - 10.3 mg/dL   GFR calc non Af Amer 34 (L) >60 mL/min   GFR calc Af Amer 39 (L) >60 mL/min    Comment: (NOTE) The eGFR  has been calculated using the CKD EPI equation. This calculation has not been validated in all clinical situations. eGFR's persistently <60 mL/min signify possible Chronic Kidney Disease.    Anion gap 13 5 - 15  CBC     Status: Abnormal   Collection Time: 04/03/15  4:07 AM  Result Value Ref Range   WBC 26.3 (H) 4.0 - 10.5 K/uL   RBC 3.39 (L) 3.87 - 5.11 MIL/uL   Hemoglobin 8.9 (L) 12.0 - 15.0 g/dL   HCT 28.3 (L) 36.0 - 46.0 %   MCV 83.5 78.0 - 100.0 fL   MCH 26.3 26.0 - 34.0 pg   MCHC 31.4 30.0 - 36.0 g/dL   RDW 17.9 (H) 11.5 - 15.5 %   Platelets 218 150 - 400 K/uL  Glucose, capillary     Status: Abnormal   Collection Time: 04/03/15  7:21 AM  Result Value Ref Range   Glucose-Capillary 147 (H) 65 - 99 mg/dL   Comment 1 Notify RN    Comment 2 Document in Chart   Glucose, capillary     Status: Abnormal   Collection Time: 04/03/15 11:25 AM  Result Value  Ref Range   Glucose-Capillary 131 (H) 65 - 99 mg/dL   Comment 1 Notify RN    Comment 2 Document in Chart   Glucose, capillary     Status: Abnormal   Collection Time: 04/03/15  4:13 PM  Result Value Ref Range   Glucose-Capillary 140 (H) 65 - 99 mg/dL   Comment 1 Notify RN    Comment 2 Document in Chart   Glucose, capillary     Status: Abnormal   Collection Time: 04/03/15  9:16 PM  Result Value Ref Range   Glucose-Capillary 177 (H) 65 - 99 mg/dL   Comment 1 Notify RN    Comment 2 Document in Chart   Protime-INR     Status: Abnormal   Collection Time: 04/04/15  4:15 AM  Result Value Ref Range   Prothrombin Time 20.5 (H) 11.6 - 15.2 seconds   INR 1.75 (H) 0.00 - 1.49  CBC     Status: Abnormal   Collection Time: 04/04/15  4:15 AM  Result Value Ref Range   WBC 35.9 (H) 4.0 - 10.5 K/uL   RBC 3.48 (L) 3.87 - 5.11 MIL/uL   Hemoglobin 9.4 (L) 12.0 - 15.0 g/dL   HCT 29.2 (L) 36.0 - 46.0 %   MCV 83.9 78.0 - 100.0 fL   MCH 27.0 26.0 - 34.0 pg   MCHC 32.2 30.0 - 36.0 g/dL   RDW 18.4 (H) 11.5 - 15.5 %   Platelets 280 150 - 400 K/uL  Basic metabolic panel     Status: Abnormal   Collection Time: 04/04/15  4:15 AM  Result Value Ref Range   Sodium 133 (L) 135 - 145 mmol/L   Potassium 4.1 3.5 - 5.1 mmol/L   Chloride 94 (L) 101 - 111 mmol/L   CO2 22 22 - 32 mmol/L   Glucose, Bld 200 (H) 65 - 99 mg/dL   BUN 37 (H) 6 - 20 mg/dL   Creatinine, Ser 1.63 (H) 0.44 - 1.00 mg/dL   Calcium 8.9 8.9 - 10.3 mg/dL   GFR calc non Af Amer 31 (L) >60 mL/min   GFR calc Af Amer 36 (L) >60 mL/min    Comment: (NOTE) The eGFR has been calculated using the CKD EPI equation. This calculation has not been validated in all clinical situations. eGFR's persistently <60 mL/min signify  possible Chronic Kidney Disease.    Anion gap 17 (H) 5 - 15  Glucose, capillary     Status: Abnormal   Collection Time: 04/04/15  7:34 AM  Result Value Ref Range   Glucose-Capillary 177 (H) 65 - 99 mg/dL    ABGS No results  for input(s): PHART, PO2ART, TCO2, HCO3 in the last 72 hours.  Invalid input(s): PCO2 CULTURES Recent Results (from the past 240 hour(s))  Culture, Urine     Status: None   Collection Time: 03/27/15  1:15 PM  Result Value Ref Range Status   Specimen Description URINE, CATHETERIZED  Final   Special Requests NONE  Final   Culture   Final    NO GROWTH 2 DAYS Performed at [facility]    Report Status 03/29/2015 FINAL  Final  Culture, blood (routine x 2)     Status: None (Preliminary result)   Collection Time: 04/02/15  9:40 AM  Result Value Ref Range Status   Specimen Description BLOOD LEFT HAND  Final   Special Requests BOTTLES DRAWN AEROBIC ONLY 6CC  Final   Culture NO GROWTH 2 DAYS  Final   Report Status PENDING  Incomplete  Culture, blood (routine x 2)     Status: None (Preliminary result)   Collection Time: 04/02/15  9:47 AM  Result Value Ref Range Status   Specimen Description BLOOD RIGHT HAND  Final   Special Requests BOTTLES DRAWN AEROBIC ONLY [place]  Final   Culture NO GROWTH 2 DAYS  Final   Report Status PENDING  Incomplete   Studies/Results: Dg Chest Port 1 View  04/04/2015  CLINICAL DATA:  Respiratory distress. EXAM: PORTABLE CHEST 1 VIEW COMPARISON:  04/01/2015. FINDINGS: Patient is rotated to the left. On today's exam there is opacification of the entire left hemi thorax. Findings consistent with left lung atelectasis and/or large left pleural effusion. Underlying left lung infiltrate cannot be excluded . Diffuse mild infiltrate present throughout the right lung. No pneumothorax. IMPRESSION: 1. On today's exam there is complete opacification of the left hemi thorax consistent with left lung atelectasis and/or large left pleural effusion. Underlying left lung infiltrate cannot be excluded . 2. Diffuse mild infiltrate right lung. Electronically Signed   By: Marcello Moores  Register   On: 04/04/2015 07:47    Medications:  Prior to Admission:  Prescriptions prior to admission   Medication Sig Dispense Refill Last Dose  . ALPRAZolam (XANAX) 0.5 MG tablet Take 0.5 mg by mouth at bedtime as needed for sleep.    unknown  . atorvastatin (LIPITOR) 20 MG tablet TAKE ONE TABLET BY MOUTH AT BEDTIME. 30 tablet 3 03/23/2015 at Unknown time  . Cholecalciferol 5000 UNITS TABS Take 1 tablet by mouth daily.   unknown  . citalopram (CELEXA) 20 MG tablet Take 20 mg by mouth at bedtime.    unknown  . Fish Oil-Cholecalciferol (FISH OIL + D3 PO) Take 3 capsules by mouth once a week.    unknown  . iron polysaccharides (NIFEREX) 150 MG capsule Take 150 mg by mouth daily.   unknown  . metoprolol tartrate (LOPRESSOR) 25 MG tablet TAKE 1/2 TABLET BY MOUTH TWICE DAILY. 30 tablet 3 unknown  . oxyCODONE (OXY IR/ROXICODONE) 5 MG immediate release tablet Take 1 tablet by mouth every 4 (four) hours as needed for moderate pain.   0 03/20/2015 at Unknown time  . spironolactone (ALDACTONE) 25 MG tablet TAKE 1/2 TABLET BY MOUTH EVERY DAY. 45 tablet 3 unknown  . torsemide (DEMADEX) 20 MG  tablet Take 20 mg by mouth daily as needed (fluid).    03/23/2015 at Unknown time  . verapamil (CALAN-SR) 180 MG CR tablet TAKE (1) TABLET BY MOUTH AT BEDTIME. 30 tablet 6 03/23/2015 at Unknown time  . warfarin (COUMADIN) 5 MG tablet TAKE 1 TABLET BY MOUTH DAILY, EXCEPT TAKE 1 1/2 TABLETS ON MONDAYS, WEDNESDAYS, AND FRIDAYS AS DIRECTED. 45 tablet 4 unknown  . insulin lispro (HUMALOG) 100 UNIT/ML injection Inject 15 Units into the skin every morning.    2 weeks ago   Scheduled: . antiseptic oral rinse  7 mL Mouth Rinse BID  . atorvastatin  20 mg Oral QHS  . cholecalciferol  5,000 Units Oral Daily  . citalopram  20 mg Oral QHS  . diltiazem  60 mg Oral 4 times per day  . famotidine  20 mg Oral Daily  . feeding supplement (GLUCERNA SHAKE)  237 mL Oral BID BM  . feeding supplement (PRO-STAT SUGAR FREE 64)  30 mL Oral BID  . furosemide  60 mg Intravenous Q8H  . Influenza vac split quadrivalent PF  0.5 mL Intramuscular  Tomorrow-1000  . insulin aspart  0-15 Units Subcutaneous TID WC  . insulin aspart  0-5 Units Subcutaneous QHS  . levalbuterol  0.63 mg Nebulization Q8H  . levofloxacin (LEVAQUIN) IV  750 mg Intravenous Q48H  . methylPREDNISolone (SOLU-MEDROL) injection  40 mg Intravenous Q12H  . omega-3 acid ethyl esters   Oral Weekly  . pantoprazole  40 mg Oral Daily  . senna-docusate  2 tablet Oral QHS  . sodium chloride  3 mL Intravenous Q12H  . spironolactone  25 mg Oral Daily  . vancomycin  1,500 mg Intravenous Q24H   Continuous:  PRF:[id number], zolpidem  Assesment: She has acute hypoxic respiratory failure. She has a large left pleural effusion. She also has ascites. I think her pleural effusion is giving her more issues at this point. She does not have COPD at baseline. She is also being treated for healthcare associated pneumonia with appropriate antibiotics.  She has nonalcoholic cirrhosis with ascites.  She has atrial fibrillation which is being treated Principal Problem:   Acute respiratory failure with hypoxia ([place]) Active Problems:   Diabetes mellitus with nephropathy ([place])   OBESITY   Essential hypertension   Atrial fibrillation (HCC)   Diastolic CHF, acute on chronic (HCC)   Long term (current) use of anticoagulants   CKD (chronic kidney disease), stage IV (HCC)   Nonalcoholic fatty liver disease   Atrial fibrillation with rapid ventricular response (HCC)   Hematuria, gross   Acute blood loss anemia   Supratherapeutic INR   NASH (nonalcoholic steatohepatitis)   HCAP (healthcare-associated pneumonia)   Ascites   Leukocytosis   Malnutrition of moderate degree    Plan: I discussed her situation with Dr. Ardeen Garland who is the radiologist here today. She will have thoracentesis first and depending on how she does clinically from that plan to go ahead with the paracentesis. I think if she doesn't improve with this she may need transfer to higher level of care. I will not be here  this weekend but will return on the 24th    LOS: 11 days   Lawsen Arnott L 04/04/2015, 8:55 AM

## 2015-04-05 ENCOUNTER — Inpatient Hospital Stay (HOSPITAL_COMMUNITY): Payer: Medicare Other

## 2015-04-05 LAB — BASIC METABOLIC PANEL
Anion gap: 17 — ABNORMAL HIGH (ref 5–15)
BUN: 52 mg/dL — AB (ref 6–20)
CHLORIDE: 95 mmol/L — AB (ref 101–111)
CO2: 22 mmol/L (ref 22–32)
Calcium: 8.4 mg/dL — ABNORMAL LOW (ref 8.9–10.3)
Creatinine, Ser: 1.9 mg/dL — ABNORMAL HIGH (ref 0.44–1.00)
GFR calc Af Amer: 30 mL/min — ABNORMAL LOW (ref >60)
GFR calc non Af Amer: 26 mL/min — ABNORMAL LOW (ref >60)
GLUCOSE: 209 mg/dL — AB (ref 65–99)
POTASSIUM: 4.2 mmol/L (ref 3.5–5.1)
Sodium: 134 mmol/L — ABNORMAL LOW (ref 135–145)

## 2015-04-05 LAB — GLUCOSE, CAPILLARY
GLUCOSE-CAPILLARY: 189 mg/dL — AB (ref 65–99)
Glucose-Capillary: 212 mg/dL — ABNORMAL HIGH (ref 65–99)
Glucose-Capillary: 186 mg/dL — ABNORMAL HIGH (ref 65–99)
Glucose-Capillary: 179 mg/dL — ABNORMAL HIGH (ref 65–99)

## 2015-04-05 LAB — CBC
HEMATOCRIT: 27.1 % — AB (ref 36.0–46.0)
Hemoglobin: 8.8 g/dL — ABNORMAL LOW (ref 12.0–15.0)
MCH: 26.7 pg (ref 26.0–34.0)
MCHC: 32.5 g/dL (ref 30.0–36.0)
MCV: 82.1 fL (ref 78.0–100.0)
Platelets: 244 10*3/uL (ref 150–400)
RBC: 3.3 MIL/uL — ABNORMAL LOW (ref 3.87–5.11)
RDW: 18.4 % — AB (ref 11.5–15.5)
WBC: 25.1 10*3/uL — ABNORMAL HIGH (ref 4.0–10.5)

## 2015-04-05 LAB — PROTIME-INR
INR: 1.96 — ABNORMAL HIGH (ref 0.00–1.49)
Prothrombin Time: 22.2 seconds — ABNORMAL HIGH (ref 11.6–15.2)

## 2015-04-05 MED ORDER — SODIUM CHLORIDE 0.9 % IJ SOLN: 10.0000 mL | INTRAMUSCULAR | Status: DC | PRN

## 2015-04-05 MED ORDER — PIPERACILLIN-TAZOBACTAM 3.375 G IVPB
3.3750 g | Freq: Three times a day (TID) | INTRAVENOUS | Status: DC
  Administered 2015-04-05 – 2015-04-08 (×9): 3.375 g via INTRAVENOUS
  Filled 2015-04-05 (×13): qty 50

## 2015-04-05 MED ORDER — SODIUM CHLORIDE 0.9 % IJ SOLN
10.0000 mL | Freq: Two times a day (BID) | INTRAMUSCULAR | Status: DC
  Administered 2015-04-05: 10 mL

## 2015-04-05 MED ORDER — FUROSEMIDE 10 MG/ML IJ SOLN
40.0000 mg | Freq: Two times a day (BID) | INTRAMUSCULAR | Status: DC
  Administered 2015-04-05 – 2015-04-06 (×2): 40 mg via INTRAVENOUS
  Filled 2015-04-05 (×2): qty 4

## 2015-04-05 MED ORDER — FUROSEMIDE 10 MG/ML IJ SOLN
80.0000 mg | Freq: Once | INTRAMUSCULAR | Status: AC
  Administered 2015-04-05: 80 mg via INTRAVENOUS
  Filled 2015-04-05: qty 8

## 2015-04-05 MED ORDER — INSULIN GLARGINE 100 UNIT/ML ~~LOC~~ SOLN
10.0000 [IU] | Freq: Two times a day (BID) | SUBCUTANEOUS | Status: DC
  Administered 2015-04-05: 10 [IU] via SUBCUTANEOUS
  Filled 2015-04-05 (×4): qty 0.1

## 2015-04-05 MED ORDER — RISAQUAD PO CAPS
1.0000 | ORAL_CAPSULE | Freq: Every day | ORAL | Status: DC
  Administered 2015-04-07 – 2015-04-09 (×3): 1 via ORAL
  Filled 2015-04-05 (×7): qty 1

## 2015-04-05 MED ORDER — MORPHINE SULFATE (PF) 2 MG/ML IV SOLN
1.0000 mg | Freq: Once | INTRAVENOUS | Status: AC
  Administered 2015-04-05: 1 mg via INTRAVENOUS
  Filled 2015-04-05: qty 1

## 2015-04-05 MED ORDER — INSULIN GLARGINE 100 UNIT/ML ~~LOC~~ SOLN: 12.0000 [IU] | Freq: Two times a day (BID) | SUBCUTANEOUS | Status: DC

## 2015-04-05 NOTE — Progress Notes (Signed)
Portable chest film completed

## 2015-04-05 NOTE — Progress Notes (Signed)
Dr Darrick Meigs paged awaiting call back to give update on patient condition

## 2015-04-05 NOTE — Progress Notes (Signed)
Patient was started on BiPAP for labored breathing this afternoon. She looks much better and her oxygen saturations are much better. Right upper extremity PICC line placed for poor IV access. Chest x-ray revealed large opacity in the left lung with increased aeration in the left upper lobe noted; increased opacities are noted in the right upper and lower lobes consistent with pneumonia or edema. -We will broaden antibiotic therapy to Zosyn. Discontinue Levaquin. Continue vancomycin. -Continue BiPAP. Will order ABG tomorrow morning. -Consider ordering noncontrasted CT scan of the chest for further evaluation of the opacities, if patient is able to tolerated. -Add 3 times a day Xopenex.
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
## 2015-04-05 NOTE — Progress Notes (Signed)
[facility] PROGRESS NOTE  Krystal [name] SNK:[id number] DOB: 1946-02-06 DOA: 03/15/2015 PCP: Krystal Kilts, MD  Summary: 69 y/o female was admitted to the hospital with SOB. Found to have acute on chronic diastolic congested heart failure with rapid atrial fibrillation. She has been seen by cardiology and has been started on diuretics with slow progress. Her respiratory status has declined since admission and it is felt that she has worsening pleural effusion and ascites. She may also have an underlying PNA. She is on abx. Patient underwent thoracentesis on 04/04/15 yielding 1.4 L of brownish fluid.  If the patient fails to improve over the weekend, we need to consider transfer to a higher level of care.   Assessment/Plan: Atrial fibrillation with RVR, rate controlled with oral Cardizem. Following small dose of Vitamin K,  INR stabilizing at 1.96  from 2.26.  ECHO as below. Will need to start on IV Heparin after procedures today.  HCAP, seen on CXR 10/17. Worsening leukocytosis overall, but starting to trend down; sh remains afebrile. Will continue Levaquin and Vanc. BC reveal no growth to date however final results are still pending.  Acute on chronic diastolic congestive heart failure with superimposed right heart dysfunction and pulmonary hypertension. Cardiology is following. She is currently on IV Lasix dosing, per cardiology. However, her renal function has gotten worse over the past 48 hours, so will decrease Lasix dose from 60 mg IV every 8 hours 2 the dose to 40mg  every 12 hours. Pleural effusion. Patient underwent diagnostic and therapeutic thoracentesis on 04/04/15 yielding 1.4 L of brownish fluid. Fluid analysis revealed 3754 WBCs with 44 neutrophils. Query peripneumonic effusion. Fluid culture pending, but negative to date. Warfarin coagulopathy/supratherapeutic INR. INR 1.75 on following a dose of Vitamin K; now back up to 1.96. Continue to monitor.   Acute anemia, likely  in the setting of mild acute blood loss secondary to anticoagulation. Hgb starting to trend down some; no obvious bleeding. Will transfuse if drops below 7. Continue to monitor.  Hyponatremia, continues to improve.   CKD stage III-IV. Creatinine continues to trend up from 1.53-1.63-1.9 today. As above, will decrease Lasix to 40 mg IV every 12 hours.  Acute respiratory failure with hypoxia, currently patient is requiring non-rebreather mask. Suspect this is related to CHF and PNA; ascites contributing is also a consideration. Will continue to wean off O2 as tolerated. Pulmonology following and recommended thoracentesis which was done.  DM type 2. CBGs trending up on steroid. Will start Lantus and continue SSI Nonalcoholic fatty liver disease, LFTs from 10/18 revealed hyperbilirubinemia, but no significant transaminitis. Abdominal ultrasound on 10/11 revealed small amount of ascites in the right lower quadrant. CT of the abdomen done on 10/15 showed worsening ascites. INR trending up again. Paracentesis planned, but radiology said there was not enough fluid to drain. Continue Spironolactone. Subacute/chronic lower abdominal pain, lipase unremarkable. Abdominal CT revealed a large amount of abdominal ascites increased since previous study, with suggestion of loculation. She is on antibiotics. Paracentesis planned, but there was not enough fluid to drain.  Morbid obesity. Possibly causing obesity hypoventilation syndrome. Leukocytosis. Felt to be related to PNA although she has remains afebrile, is not having any diarrhea, and BC are in process. Steroid could also be contributing, but her wbc is trending down. Continue current abx.   Code Status: Full  DVT prophylaxis SCDs Family Communication:  Family not available. Discussed with patient. Disposition Plan: Discharge when improved. Dr. Roderic Palau discussed with Dr. Luan Pulling, if patient does not  improve over the weekend, she may need transfer to [facility] for further  care.    Consultants:  Cardiology  Pulmonology  Procedures:  Echocardiogram 03/25/2015:Study Conclusions - Left ventricle: The cavity size was normal. Wall thickness was increased in a pattern of mild LVH. Systolic function was normal. The estimated ejection fraction was in the range of 60% to 65%. Wall motion was normal; there were no regional wall motion abnormalities. The study is not technically sufficient to allow evaluation of LV diastolic function. - Ventricular septum: The contour showed diastolic flattening and systolic flattening. - Aortic valve: Mildly calcified annulus. Trileaflet. - Mitral valve: Calcified annulus. There was trivial regurgitation. - Left atrium: The atrium was mildly dilated. - Right ventricle: The cavity size was severely dilated. Systolic function was reduced - difficult to assess. - Right atrium: The atrium was mildly dilated. - Tricuspid valve: There was moderate regurgitation. Peak RV-RA gradient (S): 65 mm Hg. - Pulmonary arteries: Systolic pressure could not be accurately estimated. - Pericardium, extracardiac: There was no pericardial effusion.  Antibiotics:  Rocephin 10/13>>10/15  Levaquin 10/18>>  Vancomycin 10/19>>  HPI/Subjective: Patient says she is breathing about the same. No chest pain. Overall, she feels weak.   Objective: Filed Vitals:   04/05/15 0800  BP: 147/59  Pulse: 91  Temp:   Resp: 37   temperature 98.3. Oxygen saturation 91% on nonrebreather.  Intake/Output Summary (Last 24 hours) at 04/05/15 0906 Last data filed at 04/05/15 0433  Gross per 24 hour  Intake    713 ml  Output    450 ml  Net    263 ml   Filed Weights   04/03/15 0500 04/04/15 0410 04/05/15 0430  Weight: 140.6 kg (309 lb 15.5 oz) 141 kg (310 lb 13.6 oz) 142.2 kg (313 lb 7.9 oz)    Exam:  General: Obese 69 year old woman laying in bed, in no acute distress. Cardiovascular: irregularly irregular, S1, S2   Respiratory:  Lungs clear  anteriorly; few basal crackles;  decreased breath sounds in the bases. Abdomen: obese, positive bowel sounds, soft, nontender, nondistended.  Musculoskeletal: 1+ LE edema bilaterally  neurologic: She is alert and oriented 2. Cranial nerves II through XII grossly intact.  Data Reviewed: Basic Metabolic Panel:  Recent Labs Lab 04/01/15 2204 04/02/15 0542 04/03/15 0407 04/04/15 0415 04/05/15 0441  NA 131* 132* 133* 133* 134*  K 3.9 3.8 3.6 4.1 4.2  CL 93* 94* 95* 94* 95*  CO2 26 25 25 22 22   GLUCOSE 155* 152* 144* 200* 209*  BUN 28* 28* 31* 37* 52*  CREATININE 1.40* 1.40* 1.53* 1.63* 1.90*  CALCIUM 9.0 9.0 8.7* 8.9 8.4*   Liver Function Tests:  Recent Labs Lab 04/01/15 0615  AST 29  ALT 7*  ALKPHOS 142*  BILITOT 4.8*  PROT 7.2  ALBUMIN 2.4*    CBC:  Recent Labs Lab 04/01/15 2204 04/02/15 0542 04/03/15 0407 04/04/15 0415 04/05/15 0441  WBC 22.3* 25.4* 26.3* 35.9* 25.1*  HGB 9.5* 9.2* 8.9* 9.4* 8.8*  HCT 29.4* 28.8* 28.3* 29.2* 27.1*  MCV 82.8 83.2 83.5 83.9 82.1  PLT 211 223 218 280 244    BNP (last 3 results)  Recent Labs  04/09/2015 1954  BNP 422.0*     CBG:  Recent Labs Lab 04/04/15 0734 04/04/15 1135 04/04/15 1618 04/04/15 2108 04/05/15 0731  GLUCAP 177* 181* 196* 197* 212*    Recent Results (from the past 240 hour(s))  Culture, Urine     Status: None   Collection Time:  03/27/15  1:15 PM  Result Value Ref Range Status   Specimen Description URINE, CATHETERIZED  Final   Special Requests NONE  Final   Culture   Final    NO GROWTH 2 DAYS Performed at [facility]    Report Status 03/29/2015 FINAL  Final  Culture, blood (routine x 2)     Status: None (Preliminary result)   Collection Time: 04/02/15  9:40 AM  Result Value Ref Range Status   Specimen Description BLOOD LEFT HAND  Final   Special Requests BOTTLES DRAWN AEROBIC ONLY [place]  Final   Culture NO GROWTH 3 DAYS  Final   Report Status PENDING   Incomplete  Culture, blood (routine x 2)     Status: None (Preliminary result)   Collection Time: 04/02/15  9:47 AM  Result Value Ref Range Status   Specimen Description BLOOD RIGHT HAND  Final   Special Requests BOTTLES DRAWN AEROBIC ONLY [place]  Final   Culture NO GROWTH 3 DAYS  Final   Report Status PENDING  Incomplete  Culture, body fluid-bottle     Status: None (Preliminary result)   Collection Time: 04/04/15  1:15 PM  Result Value Ref Range Status   Specimen Description PLEURAL  Final   Special Requests BOTTLES DRAWN AEROBIC AND ANAEROBIC [place]  Final   Culture NO GROWTH < 24 HOURS  Final   Report Status PENDING  Incomplete    Scheduled Meds: . antiseptic oral rinse  7 mL Mouth Rinse BID  . atorvastatin  20 mg Oral QHS  . cholecalciferol  5,000 Units Oral Daily  . citalopram  20 mg Oral QHS  . diltiazem  60 mg Oral 4 times per day  . famotidine  20 mg Oral Daily  . feeding supplement (GLUCERNA SHAKE)  237 mL Oral BID BM  . feeding supplement (PRO-STAT SUGAR FREE 64)  30 mL Oral BID  . furosemide  60 mg Intravenous Q8H  . Influenza vac split quadrivalent PF  0.5 mL Intramuscular Tomorrow-1000  . insulin aspart  0-15 Units Subcutaneous TID WC  . insulin aspart  0-5 Units Subcutaneous QHS  . levalbuterol  0.63 mg Nebulization Q8H  . levofloxacin (LEVAQUIN) IV  750 mg Intravenous Q48H  . methylPREDNISolone (SOLU-MEDROL) injection  40 mg Intravenous Q12H  . omega-3 acid ethyl esters   Oral Weekly  . pantoprazole  40 mg Oral Daily  . senna-docusate  2 tablet Oral QHS  . sodium chloride  3 mL Intravenous Q12H  . spironolactone  25 mg Oral Daily  . vancomycin  1,500 mg Intravenous Q24H   Continuous Infusions:   Principal Problem:   Acute respiratory failure with hypoxia (HCC) Active Problems:   Diastolic CHF, acute on chronic (HCC)   Atrial fibrillation with rapid ventricular response (HCC)   CKD (chronic kidney disease), stage IV (HCC)   Nonalcoholic fatty liver  disease   Supratherapeutic INR   HCAP (healthcare-associated pneumonia)   Diabetes mellitus with nephropathy ([place])   OBESITY   Essential hypertension   Atrial fibrillation ([place])   Long term (current) use of anticoagulants   Hematuria, gross   Acute blood loss anemia   NASH (nonalcoholic steatohepatitis)   Ascites   Leukocytosis   Malnutrition of moderate degree   Pleural effusion    Time spent: 20 minutes   Rexene Alberts, MD [facility] Pager [phone]. If 7PM-7AM, please contact night-coverage at [email or web address], password [facility] 04/05/2015, 9:06 AM  LOS: 12 days

## 2015-04-05 NOTE — Progress Notes (Signed)
Dr Dillon Bjork on the floor assessing a patient; informed about change in the patients condition; patient was assessed by Dr Marily Memos, stat portable chest x-ray ordered

## 2015-04-05 NOTE — Progress Notes (Signed)
Peripherally Inserted Central Catheter/Midline Placement  The IV Nurse has discussed with the patient and/or persons authorized to consent for the patient, the purpose of this procedure and the potential benefits and risks involved with this procedure.  The benefits include less needle sticks, lab draws from the catheter and patient may be discharged home with the catheter.  Risks include, but not limited to, infection, bleeding, blood clot (thrombus formation), and puncture of an artery; nerve damage and irregular heat beat.  Alternatives to this procedure were also discussed.  PICC/Midline Placement Documentation  PICC / Midline Double Lumen 70/96/28 PICC Right Basilic 45 cm 0 cm (Active)  Indication for Insertion or Continuance of Line Chronic illness with exacerbations (CF, Sickle Cell, etc.) 04/05/2015  3:00 PM  Exposed Catheter (cm) 0 cm 04/05/2015  3:00 PM  Site Assessment Clean;Dry;Intact 04/05/2015  3:00 PM  Lumen #1 Status Flushed;Blood return noted 04/05/2015  3:00 PM  Lumen #2 Status Flushed;Blood return noted 04/05/2015  3:00 PM  Dressing Type Transparent;Securing device 04/05/2015  3:00 PM  Dressing Status Clean;Dry;Intact;Antimicrobial disc in place 04/05/2015  3:00 PM  Dressing Intervention New dressing 04/05/2015  3:00 PM  Dressing Change Due 04/12/15 04/05/2015  3:00 PM       Hillery Jacks 04/05/2015, 3:25 PM

## 2015-04-05 NOTE — Progress Notes (Signed)
Patient had been on non-rebreather mask all morning and oxygen saturations would only rise to 91% with labored breathing. [facility] called and was somewhat concerned that oxygen saturation dropped to 87%, so I called respiratory because patient did have order for bi-pap and we placed on 100% bi-pap, saturations now increased to 96% and breathing easier than without bi-pap. Also a PICC was placed in right upper arm double lumen.

## 2015-04-05 NOTE — Progress Notes (Signed)
Dr Darrick Meigs called about patient complaining of not getting enough air; currently on Bipap at 100% FiO2, with rate of 20/8; Sats in the low 90's and RR in the high 20's low 30's; Updated on orders from Dr Marily Memos; order received for 1mg  of Morphine IV and to call with patient condition after morphine given

## 2015-04-05 NOTE — Progress Notes (Signed)
Dr Dillon Bjork paged with results of portable chest film; 80mg  of IV Lasix ordered times 1 dose now for chest film results

## 2015-04-06 ENCOUNTER — Inpatient Hospital Stay (HOSPITAL_COMMUNITY): Payer: Medicare Other

## 2015-04-06 ENCOUNTER — Inpatient Hospital Stay (HOSPITAL_COMMUNITY): Payer: Medicare Other | Admitting: Anesthesiology

## 2015-04-06 DIAGNOSIS — J9601 Acute respiratory failure with hypoxia: Secondary | ICD-10-CM

## 2015-04-06 DIAGNOSIS — E440 Moderate protein-calorie malnutrition: Secondary | ICD-10-CM

## 2015-04-06 DIAGNOSIS — J80 Acute respiratory distress syndrome: Secondary | ICD-10-CM

## 2015-04-06 LAB — BLOOD GAS, ARTERIAL
ACID-BASE DEFICIT: 3.7 mmol/L — AB (ref 0.0–2.0)
ACID-BASE DEFICIT: 4.6 mmol/L — AB (ref 0.0–2.0)
Acid-base deficit: 2 mmol/L (ref 0.0–2.0)
Acid-base deficit: 3.4 mmol/L — ABNORMAL HIGH (ref 0.0–2.0)
Bicarbonate: 22.9 mEq/L (ref 20.0–24.0)
Bicarbonate: 21.2 mEq/L (ref 20.0–24.0)
Bicarbonate: 19.9 mEq/L — ABNORMAL LOW (ref 20.0–24.0)
Bicarbonate: 18.9 mEq/L — ABNORMAL LOW (ref 20.0–24.0)
DELIVERY SYSTEMS: POSITIVE
DRAWN BY: 234301
DRAWN BY: 406621
DRAWN BY: 437071
Drawn by: 317771
Expiratory PAP: 9
FIO2: 1
FIO2: 100
FIO2: 0.8
FIO2: 0.9
INSPIRATORY PAP: 20
LHR: 8 {breaths}/min
LHR: 15 {breaths}/min
MECHVT: 530 mL
MECHVT: 490 mL
MECHVT: 430 mL
O2 SAT: 80.2 %
O2 SAT: 95.9 %
O2 Saturation: 86.6 %
O2 Saturation: 92.5 %
PATIENT TEMPERATURE: 98.6
PCO2 ART: 39.6 mmHg (ref 35.0–45.0)
PEEP/CPAP: 14 cmH2O
PEEP/CPAP: 14 cmH2O
PEEP: 10 cmH2O
PH ART: 7.451 — AB (ref 7.350–7.450)
PH ART: 7.423 (ref 7.350–7.450)
PH ART: 7.441 (ref 7.350–7.450)
PO2 ART: 54.4 mmHg — AB (ref 80.0–100.0)
PO2 ART: 64.9 mmHg — AB (ref 80.0–100.0)
Patient temperature: 97.7
RATE: 30 resp/min
RATE: 32 resp/min
TCO2: 20.9 mmol/L (ref 0–100)
TCO2: 19.7 mmol/L (ref 0–100)
pCO2 arterial: 31.2 mmHg — ABNORMAL LOW (ref 35.0–45.0)
pCO2 arterial: 31.1 mmHg — ABNORMAL LOW (ref 35.0–45.0)
pCO2 arterial: 28 mmHg — ABNORMAL LOW (ref 35.0–45.0)
pH, Arterial: 7.35 (ref 7.350–7.450)
pO2, Arterial: 59 mmHg — ABNORMAL LOW (ref 80.0–100.0)
pO2, Arterial: 87.8 mmHg (ref 80.0–100.0)

## 2015-04-06 LAB — PROTIME-INR
INR: 2.57 — ABNORMAL HIGH (ref 0.00–1.49)
Prothrombin Time: 27.2 seconds — ABNORMAL HIGH (ref 11.6–15.2)

## 2015-04-06 LAB — BASIC METABOLIC PANEL
Anion gap: 16 — ABNORMAL HIGH (ref 5–15)
BUN: 63 mg/dL — AB (ref 6–20)
CHLORIDE: 96 mmol/L — AB (ref 101–111)
CO2: 22 mmol/L (ref 22–32)
CREATININE: 2.37 mg/dL — AB (ref 0.44–1.00)
Calcium: 7.8 mg/dL — ABNORMAL LOW (ref 8.9–10.3)
GFR calc Af Amer: 23 mL/min — ABNORMAL LOW (ref >60)
GFR calc non Af Amer: 20 mL/min — ABNORMAL LOW (ref >60)
GLUCOSE: 193 mg/dL — AB (ref 65–99)
POTASSIUM: 4.3 mmol/L (ref 3.5–5.1)
SODIUM: 134 mmol/L — AB (ref 135–145)

## 2015-04-06 LAB — GLUCOSE, CAPILLARY
GLUCOSE-CAPILLARY: 196 mg/dL — AB (ref 65–99)
GLUCOSE-CAPILLARY: 194 mg/dL — AB (ref 65–99)
GLUCOSE-CAPILLARY: 191 mg/dL — AB (ref 65–99)
Glucose-Capillary: 217 mg/dL — ABNORMAL HIGH (ref 65–99)

## 2015-04-06 LAB — CBC
HEMATOCRIT: 27 % — AB (ref 36.0–46.0)
Hemoglobin: 8.6 g/dL — ABNORMAL LOW (ref 12.0–15.0)
MCH: 26.2 pg (ref 26.0–34.0)
MCHC: 31.9 g/dL (ref 30.0–36.0)
MCV: 82.3 fL (ref 78.0–100.0)
PLATELETS: 241 10*3/uL (ref 150–400)
RBC: 3.28 MIL/uL — ABNORMAL LOW (ref 3.87–5.11)
RDW: 18.2 % — AB (ref 11.5–15.5)
WBC: 26.5 10*3/uL — AB (ref 4.0–10.5)

## 2015-04-06 LAB — MRSA PCR SCREENING: MRSA BY PCR: NEGATIVE

## 2015-04-06 LAB — VANCOMYCIN, TROUGH: VANCOMYCIN TR: 46 ug/mL — AB (ref 10.0–20.0)

## 2015-04-06 MED ORDER — SODIUM CHLORIDE 0.9 % IV SOLN
25.0000 ug/h | INTRAVENOUS | Status: DC
  Administered 2015-04-06: 20 ug/h via INTRAVENOUS
  Administered 2015-04-07: 100 ug/h via INTRAVENOUS
  Administered 2015-04-08 – 2015-04-09 (×2): 150 ug/h via INTRAVENOUS
  Filled 2015-04-06 (×4): qty 50

## 2015-04-06 MED ORDER — SODIUM CHLORIDE 0.9 % IJ SOLN
10.0000 mL | Freq: Two times a day (BID) | INTRAMUSCULAR | Status: DC
  Administered 2015-04-08 – 2015-04-09 (×4): 10 mL

## 2015-04-06 MED ORDER — MIDAZOLAM HCL 2 MG/2ML IJ SOLN: 2.0000 mg | INTRAMUSCULAR | Status: DC | PRN

## 2015-04-06 MED ORDER — CHLORHEXIDINE GLUCONATE 0.12% ORAL RINSE (MEDLINE KIT): 15.0000 mL | Freq: Two times a day (BID) | OROMUCOSAL | Status: DC

## 2015-04-06 MED ORDER — FENTANYL CITRATE (PF) 100 MCG/2ML IJ SOLN
50.0000 ug | INTRAMUSCULAR | Status: DC | PRN
  Administered 2015-04-06: 100 ug via INTRAVENOUS

## 2015-04-06 MED ORDER — SUCCINYLCHOLINE CHLORIDE 20 MG/ML IJ SOLN
INTRAMUSCULAR | Status: DC | PRN
  Administered 2015-04-06: 180 mg via INTRAVENOUS

## 2015-04-06 MED ORDER — LORAZEPAM 2 MG/ML IJ SOLN: 1.0000 mg | INTRAMUSCULAR | Status: DC | PRN

## 2015-04-06 MED ORDER — PROPOFOL 10 MG/ML IV BOLUS
INTRAVENOUS | Status: DC | PRN
  Administered 2015-04-06: 100 mg via INTRAVENOUS

## 2015-04-06 MED ORDER — PROPOFOL 1000 MG/100ML IV EMUL
INTRAVENOUS | Status: AC
  Administered 2015-04-06: 1000 mg
  Filled 2015-04-06: qty 100

## 2015-04-06 MED ORDER — PANTOPRAZOLE SODIUM 40 MG PO PACK
40.0000 mg | PACK | Freq: Every day | ORAL | Status: DC
  Administered 2015-04-07 – 2015-04-09 (×3): 40 mg
  Filled 2015-04-06 (×4): qty 20

## 2015-04-06 MED ORDER — ANTISEPTIC ORAL RINSE SOLUTION (CORINZ)
7.0000 mL | Freq: Four times a day (QID) | OROMUCOSAL | Status: DC
  Administered 2015-04-06 – 2015-04-10 (×15): 7 mL via OROMUCOSAL

## 2015-04-06 MED ORDER — SODIUM CHLORIDE 0.9 % IV BOLUS (SEPSIS)
1000.0000 mL | Freq: Once | INTRAVENOUS | Status: AC
  Administered 2015-04-06: 1000 mL via INTRAVENOUS

## 2015-04-06 MED ORDER — PROPOFOL 1000 MG/100ML IV EMUL
5.0000 ug/kg/min | INTRAVENOUS | Status: DC
Start: 2015-04-06 — End: 2015-04-06
  Filled 2015-04-06: qty 100

## 2015-04-06 MED ORDER — INSULIN GLARGINE 100 UNIT/ML ~~LOC~~ SOLN
12.0000 [IU] | Freq: Two times a day (BID) | SUBCUTANEOUS | Status: DC
  Filled 2015-04-06 (×3): qty 0.12

## 2015-04-06 MED ORDER — LIDOCAINE HCL (CARDIAC) 20 MG/ML IV SOLN
INTRAVENOUS | Status: AC
  Filled 2015-04-06: qty 5

## 2015-04-06 MED ORDER — SODIUM CHLORIDE 0.9 % IJ SOLN: 10.0000 mL | INTRAMUSCULAR | Status: DC | PRN

## 2015-04-06 MED ORDER — MORPHINE SULFATE (PF) 2 MG/ML IV SOLN: 2.0000 mg | INTRAVENOUS | Status: DC | PRN

## 2015-04-06 MED ORDER — FENTANYL CITRATE (PF) 100 MCG/2ML IJ SOLN
INTRAMUSCULAR | Status: AC
  Filled 2015-04-06: qty 2

## 2015-04-06 MED ORDER — ANTISEPTIC ORAL RINSE SOLUTION (CORINZ): 7.0000 mL | Freq: Four times a day (QID) | OROMUCOSAL | Status: DC

## 2015-04-06 MED ORDER — PANTOPRAZOLE SODIUM 40 MG IV SOLR
40.0000 mg | INTRAVENOUS | Status: DC
  Administered 2015-04-06: 40 mg via INTRAVENOUS
  Filled 2015-04-06: qty 40

## 2015-04-06 MED ORDER — DEXTROSE 5 % IV SOLN
0.0000 ug/min | INTRAVENOUS | Status: DC
  Administered 2015-04-06: 20 ug/min via INTRAVENOUS
  Administered 2015-04-06: 30 ug/min via INTRAVENOUS
  Administered 2015-04-07: 50 ug/min via INTRAVENOUS
  Administered 2015-04-07: 40 ug/min via INTRAVENOUS
  Filled 2015-04-06 (×4): qty 1

## 2015-04-06 MED ORDER — FENTANYL BOLUS VIA INFUSION
50.0000 ug | INTRAVENOUS | Status: DC | PRN
  Filled 2015-04-06: qty 50

## 2015-04-06 MED ORDER — SODIUM CHLORIDE 0.9 % IV BOLUS (SEPSIS)
500.0000 mL | Freq: Once | INTRAVENOUS | Status: AC
  Administered 2015-04-06: 500 mL via INTRAVENOUS

## 2015-04-06 MED ORDER — ETOMIDATE 2 MG/ML IV SOLN
INTRAVENOUS | Status: AC
  Filled 2015-04-06: qty 20

## 2015-04-06 MED ORDER — MORPHINE SULFATE (PF) 2 MG/ML IV SOLN
1.0000 mg | Freq: Once | INTRAVENOUS | Status: AC
  Administered 2015-04-06: 1 mg via INTRAVENOUS
  Filled 2015-04-06: qty 1

## 2015-04-06 MED ORDER — DILTIAZEM HCL 100 MG IV SOLR: 5.0000 mg/h | INTRAVENOUS | Status: DC

## 2015-04-06 MED ORDER — FENTANYL CITRATE (PF) 100 MCG/2ML IJ SOLN: 50.0000 ug | Freq: Once | INTRAMUSCULAR | Status: AC

## 2015-04-06 MED ORDER — FUROSEMIDE 10 MG/ML IJ SOLN: 20.0000 mg | Freq: Two times a day (BID) | INTRAMUSCULAR | Status: DC

## 2015-04-06 MED ORDER — CHLORHEXIDINE GLUCONATE 0.12% ORAL RINSE (MEDLINE KIT)
15.0000 mL | Freq: Two times a day (BID) | OROMUCOSAL | Status: DC
  Administered 2015-04-06 – 2015-04-09 (×8): 15 mL via OROMUCOSAL

## 2015-04-06 MED ORDER — SUCCINYLCHOLINE CHLORIDE 20 MG/ML IJ SOLN
INTRAMUSCULAR | Status: AC
  Filled 2015-04-06: qty 1

## 2015-04-06 MED ORDER — LEVALBUTEROL HCL 0.63 MG/3ML IN NEBU
0.6300 mg | INHALATION_SOLUTION | Freq: Four times a day (QID) | RESPIRATORY_TRACT | Status: DC
  Administered 2015-04-06 – 2015-04-10 (×14): 0.63 mg via RESPIRATORY_TRACT
  Filled 2015-04-06 (×28): qty 3

## 2015-04-06 MED ORDER — INSULIN GLARGINE 100 UNIT/ML ~~LOC~~ SOLN
5.0000 [IU] | Freq: Two times a day (BID) | SUBCUTANEOUS | Status: DC
  Administered 2015-04-06 – 2015-04-08 (×4): 5 [IU] via SUBCUTANEOUS
  Filled 2015-04-06 (×5): qty 0.05

## 2015-04-06 MED ORDER — SODIUM CHLORIDE 0.9 % IV SOLN: INTRAVENOUS | Status: DC

## 2015-04-06 MED ORDER — INSULIN ASPART 100 UNIT/ML ~~LOC~~ SOLN
2.0000 [IU] | SUBCUTANEOUS | Status: DC
Start: 2015-04-06 — End: 2015-04-09
  Administered 2015-04-06 (×2): 6 [IU] via SUBCUTANEOUS
  Administered 2015-04-06: 4 [IU] via SUBCUTANEOUS
  Administered 2015-04-07: 6 [IU] via SUBCUTANEOUS
  Administered 2015-04-07: 4 [IU] via SUBCUTANEOUS
  Administered 2015-04-07: 6 [IU] via SUBCUTANEOUS
  Administered 2015-04-07: 4 [IU] via SUBCUTANEOUS
  Administered 2015-04-07: 6 [IU] via SUBCUTANEOUS
  Administered 2015-04-07: 4 [IU] via SUBCUTANEOUS
  Administered 2015-04-08 (×3): 6 [IU] via SUBCUTANEOUS
  Administered 2015-04-08 (×2): 4 [IU] via SUBCUTANEOUS
  Administered 2015-04-08 (×2): 6 [IU] via SUBCUTANEOUS

## 2015-04-06 MED ORDER — ROCURONIUM BROMIDE 50 MG/5ML IV SOLN
INTRAVENOUS | Status: AC
  Filled 2015-04-06: qty 2

## 2015-04-06 NOTE — Progress Notes (Signed)
Attempted to wean Peep/FIO2 per ARDS protocol, weaned fio2 to 70% and peep to 12, pt spo2 decreased to 83%. Increased to 80% and 14 with spo2 only 87%. Increased fio2 to 90% and peep remains at 14, spo2 92%. Will not attempt to wean any further at this time. RN aware. RT will continue to monitor.

## 2015-04-06 NOTE — Progress Notes (Signed)
ANTICOAGULATION CONSULT NOTE - Follow Up Consult  Pharmacy Consult for Coumadin Indication: atrial fibrillation  Allergies  Allergen Reactions  . Ace Inhibitors Swelling  . Actos [Pioglitazone Hydrochloride] Other (See Comments)    Liver Issues  . Aspirin Other (See Comments)    Was instructed not to take due to Coumadin  . Celecoxib Swelling  . Diovan [Valsartan] Other (See Comments)    Cough.   . Metformin And Related Other (See Comments)    Liver issues  . Tylenol [Acetaminophen] Other (See Comments)    Liver Issues    Patient Measurements: Height: 5\' 7"  (170.2 cm) Weight: (!) 311 lb 1.1 oz (141.1 kg) IBW/kg (Calculated) : 61.6  Vital Signs: Temp: 97.8 F (36.6 C) (10/23 1147) Temp Source: Oral (10/23 1147) BP: 122/49 mmHg (10/23 1545) Pulse Rate: 76 (10/23 1545)  Labs:  Recent Labs  04/04/15 0415 04/05/15 0441 04/06/15 0435  HGB 9.4* 8.8* 8.6*  HCT 29.2* 27.1* 27.0*  PLT 280 244 241  LABPROT 20.5* 22.2* 27.2*  INR 1.75* 1.96* 2.57*  CREATININE 1.63* 1.90* 2.37*    Estimated Creatinine Clearance: 33.5 mL/min (by C-G formula based on Cr of 2.37).   Medications:  Anticoag: Afib - INR 9.81 on admit 10/10. Coumadin held since admit  Vit K given 10/10, 10/17, 10/18, 10/19, 10/20 - total 21.5mg  INR today 2.57 - up today despite no coumadin in almost 2 weeks  Assessment: 69 year old female admitted to [facility] on 10/10 with respiratory distress.  She is on chronic anticoagulation with Coumadin for atrial fibrillation.  Her INR was supratherapeutic at 9.81 on admission.  She received a large amount of Vitamin K (21.5 mg) to reverse her Coumadin for thoracentesis that was performed on 10/21.  She has not received Coumadin since admission (13 days) and her INR remains elevated at 2.6, likely due to impaired hepatic function and critical illness.  Goal of Therapy:  INR 2-3   Plan:  No Coumadin today Daily PT/INR monitoring  Legrand Como, Pharm.D., BCPS,  AAHIVP Clinical Pharmacist Phone: [phone] or [phone] 04/06/2015, 4:16 PM

## 2015-04-06 NOTE — Progress Notes (Signed)
Patient arrived to the unit stable, v/s stable, foley catheter in place. Will continue to monitor very closely. MD at bedside
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
## 2015-04-06 NOTE — Progress Notes (Signed)
[facility] Progress Note Patient Name: Krystal [name] DOB: 17-Jul-1945 MRN: [id number]   Date of Service  04/06/2015  HPI/Events of Note  LOW URINE OUTPUT REPORTED BY NURSING OVER LAST FEW HOURS  eICU Interventions  ABG does not show worsening acidosis. Will repeat BMP. Fluid bolus ordered.      Intervention Category Major Interventions: Acute renal failure - evaluation and management  Dimas Chyle 04/06/2015, 10:12 PM

## 2015-04-06 NOTE — Progress Notes (Signed)
[facility] HERE TO TRANSPORT PT TO [place]. REPORT CALLED TO JOSH RN ON 54M.  HE REQUESTED THAT  LANTUS INSULIN BE HELD AT THIS TIME. VANC THROUGH 46. VANC HELD.the patient'S FAMILY INSTRUCTED WHERE TO GO AT [facility].

## 2015-04-06 NOTE — Progress Notes (Signed)
Dr Darrick Meigs paged with AM ABG results; no new orders received

## 2015-04-06 NOTE — Progress Notes (Signed)
Dr Darrick Meigs in the unit assessing patient; patient stated morphine helped with anxiety; no new orders received at this time; will continue to monitor the patient
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
## 2015-04-06 NOTE — Progress Notes (Signed)
[facility] PROGRESS NOTE  BREONIA KIRSTEIN VOZ:[id number] DOB: Sep 01, 1945 DOA: 04/06/2015 PCP: Purvis Kilts, MD  Summary: 69 y/o female was admitted to the hospital with SOB. Found to have acute on chronic diastolic congested heart failure-moderate to severe right heart failure and with rapid atrial fibrillation. She has been seen by cardiology and has been started on diuretics with slow progress. Her respiratory status has declined since admission and it is felt that she has worsening pleural effusion and pneumonia.  She is on antibiotics, broadened to vancomycin and Zosyn. Patient underwent thoracentesis on 04/04/15 yielding 1.4 L of brownish fluid.  Her respiratory status has worsened, necessitating BiPAP overnight. With maximum settings and 100% FiO2, her ABG this morning revealed a pH of 7.45, PCO2 of 31, and PO2 of 59. Given the patient's clinical worsening, she will be intubated. Patient was discussed with intensivist at [facility], Dr. Chase Caller. He has agreed to accept the patient in transfer for treatment of worsening acute respiratory failure; pending intubation.  The above was discussed with the patient and patient's husband husband who agrees with intubation and transfer to [facility]  Assessment/Plan: Atrial fibrillation with RVR, rate controlled with oral Cardizem. Following small dose of Vitamin K,  INR stabilizing at 1.9-2.6. She remains off of Coumadin, secondary to recent supratherapeutic INR and procedure with thoracentesis. Following intubation, she will likely need a diltiazem drip.  ECHO revealed an EF of 60-65%; indeterminate diastolic function; right ventricular systolic function reduced.   Healthcare associated pneumonia with acute respiratory failure with hypoxia. Seen on CXR 10/Krystal. Patient's oxygen needs have progressively increased. The likely etiology is progressive pneumonia, pleural effusion, and less likely worsening CHF. Pulmonologist, Dr. Luan Pulling was consulted a few  days ago and added IV Solu-Medrol; he recommended a thoracentesis.. Follow-up chest x-ray on 10/22 revealed diffuse right lung opacity consistent with pneumonia or edema and stable left basilar opacity. Diagnostic/therapeutic thoracentesis yielded 1.4 L of brownish fluid. Pleural culture negative to date; cell count revealed 3754 WBCs.  Patient's leukocytosis has worsened over the course of the hospitalization, likely secondary to worsening pneumonia with superimposed mild steroid therapy. We'll continue vancomycin. Levaquin was discontinued on 10/22 in favor of starting Zosyn for broader spectrum coverage. Xopenex added for some bronchospasms. Because her respiratory status has worsened and has not improved with maximal dosing of oxygen and BiPAP, she will be intubated. I have discussed this with intensivist Dr. Chase Caller at [facility]. He will be accepting her in transfer for treatment of acute respiratory failure.  Acute on chronic diastolic congestive heart failure with superimposed right heart dysfunction. Cardiology has been following. She is currently on IV Lasix dosing, per cardiology. However, her renal function has gotten worse over the past 48 hours, so Lasix was decreased from 60 mg IV every 8 hours yesterday to 40 mg IV every 12 hours. However, she was given another 80 mg of IV Lasix overnight. Her urine output has decreased. Will decrease Lasix dosing again to 20 mg IV every 12 hours. We'll start gentle IV fluids.  Pleural effusion. Patient underwent diagnostic and therapeutic thoracentesis on 04/04/15 yielding 1.4 L of brownish fluid. Fluid analysis revealed 3754 WBCs with 44 neutrophils. Query peripneumonic effusion. Fluid culture pending, but negative to date. Warfarin coagulopathy/supratherapeutic INR. Patient's INR was 9.8 on admission. Coumadin was held for several days. It trended downward and Coumadin was restarted, but her INR increased again. Coumadin was held and she was given 1 dose of vitamin  K for the thoracentesis. Coumadin is currently  on hold and her INR is still in the therapeutic range. We'll continue to hold Coumadin for now. She may be more coagulopathic secondary to worsening infection and underlying mild liver disease.   Acute anemia, likely in the setting of mild acute blood loss secondary to anticoagulation. Patient did have some mild hematuria. Her hemoglobin was stable at 9-9.4, but is trending downward today 8.4-8.8 range. We'll continue PPI.  Will transfuse if her hemoglobin drops below 7. Continue to monitor.  Hyponatremia, continues to improve and has stabilized.   CKD stage III-IV. Creatinine continues to trend up and is now 2.37. Her urine output has also decreased. This may be secondary to diuresis and/or ATN. As stated above, will decrease Lasix and start gentle IV fluids.  DM type 2. CBGs have been trending up on steroid. Lantus and sliding scale NovoLog were started. Will titrate up Lantus to 12 units twice a day. Nonalcoholic fatty liver disease, LFTs from 10/18 revealed hyperbilirubinemia, but no significant transaminitis. Abdominal ultrasound on 10/11 revealed small amount of ascites in the right lower quadrant. CT of the abdomen done on 10/15 showed worsening ascites. Paracentesis was planned, but radiology said there was not enough fluid to drain. Continue Spironolactone. Subacute/chronic lower abdominal pain, lipase unremarkable. Abdominal CT revealed a large amount of abdominal ascites increased since previous study, with suggestion of loculation. She is on antibiotics. Paracentesis planned, but there was not enough fluid to drain.  Morbid obesity. Possibly causing obesity hypoventilation syndrome. Leukocytosis. Felt to be related to PNA although she has remains afebrile. She is not having any diarrhea. Steroid tx could also be contributing.   Code Status: Full  DVT prophylaxis SCDs and anticoagulated off of Coumadin. Family Communication:  Discussed with  patient and husband. Disposition Plan: Transferred to [facility] for further treatment of acute respiratory failure.    Consultants:  Cardiology  Pulmonology  Procedures:  Mechanical intubation and ventilation, 04/06/15.  Right upper extremity PICC, 04/05/15.  Echocardiogram 03/25/2015:Study Conclusions - Left ventricle: The cavity size was normal. Wall thickness was increased in a pattern of mild LVH. Systolic function was normal. The estimated ejection fraction was in the range of 60% to 65%. Wall motion was normal; there were no regional wall motion abnormalities. The study is not technically sufficient to allow evaluation of LV diastolic function. - Ventricular septum: The contour showed diastolic flattening and systolic flattening. - Aortic valve: Mildly calcified annulus. Trileaflet. - Mitral valve: Calcified annulus. There was trivial regurgitation. - Left atrium: The atrium was mildly dilated. - Right ventricle: The cavity size was severely dilated. Systolic function was reduced - difficult to assess. - Right atrium: The atrium was mildly dilated. - Tricuspid valve: There was moderate regurgitation. Peak RV-RA gradient (S): 65 mm Hg. - Pulmonary arteries: Systolic pressure could not be accurately estimated. - Pericardium, extracardiac: There was no pericardial effusion.  Antibiotics:  Zosyn 10/22>>  Vancomycin 10/19>>  Rocephin 10/13>>10/15  Levaquin 10/18>> 10/22    HPI/Subjective: Patient became more dyspneic overnight. BiPAP was continued with titrating up the settings and FiO2 to 100%. Patient appears to be tired. She is having some wheezing.  Objective: Filed Vitals:   04/06/15 0743  BP:   Pulse:   Temp: 98.3 F (36.8 C)  Resp:    temperature 98.3. Oxygen saturation 88-91% on nonrebreather and BiPAP. Pulse 90. Respiratory rate 39. Blood pressure 138 with 58.  Intake/Output Summary (Last 24 hours) at 04/06/15 0821 Last data filed  at 04/06/15 0700  Gross per 24 hour  Intake    620 ml  Output    275 ml  Net    345 ml   Filed Weights   04/04/15 0410 04/05/15 0430 04/06/15 0500  Weight: 141 kg (310 lb 13.6 oz) 142.2 kg (313 lb 7.9 oz) 141.1 kg (311 lb 1.1 oz)    Exam:  General: Obese 69 year old [name] laying in bed; she looks slightly worse and more fatigued. Cardiovascular: irregularly irregular, S1, S2  Respiratory:  Few scattered wheezes and occasional crackles on the right;  decreased breath sounds in the bases; on BiPAP. Abdomen: obese, positive bowel sounds, soft, nontender, nondistended.  Musculoskeletal: Trace-1+ LE edema bilaterally  neurologic: She is alert and oriented 2. Cranial nerves II through XII grossly intact.  Data Reviewed: Basic Metabolic Panel:  Recent Labs Lab 04/02/15 0542 04/03/15 0407 04/04/15 0415 04/05/15 0441 04/06/15 0435  NA 132* 133* 133* 134* 134*  K 3.8 3.6 4.1 4.2 4.3  CL 94* 95* 94* 95* 96*  CO2 25 25 22 22 22   GLUCOSE 152* 144* 200* 209* 193*  BUN 28* 31* 37* 52* 63*  CREATININE 1.40* 1.53* 1.63* 1.90* 2.37*  CALCIUM 9.0 8.7* 8.9 8.4* 7.8*   Liver Function Tests:  Recent Labs Lab 04/01/15 0615  AST 29  ALT 7*  ALKPHOS 142*  BILITOT 4.8*  PROT 7.2  ALBUMIN 2.4*    CBC:  Recent Labs Lab 04/02/15 0542 04/03/15 0407 04/04/15 0415 04/05/15 0441 04/06/15 0435  WBC 25.4* 26.3* 35.9* 25.1* 26.5*  HGB 9.2* 8.9* 9.4* 8.8* 8.6*  HCT 28.8* 28.3* 29.2* 27.1* 27.0*  MCV 83.2 83.5 83.9 82.1 82.3  PLT 223 218 280 244 241    BNP (last 3 results)  Recent Labs  03/22/2015 1954  BNP 422.0*     CBG:  Recent Labs Lab 04/05/15 0731 04/05/15 1129 04/05/15 1631 04/05/15 2133 04/06/15 0729  GLUCAP 212* 189* 186* 179* 196*    Recent Results (from the past 240 hour(s))  Culture, Urine     Status: None   Collection Time: 03/27/15  1:15 PM  Result Value Ref Range Status   Specimen Description URINE, CATHETERIZED  Final   Special Requests NONE  Final    Culture   Final    NO GROWTH 2 DAYS Performed at [facility]    Report Status 03/29/2015 FINAL  Final  Culture, blood (routine x 2)     Status: None (Preliminary result)   Collection Time: 04/02/15  9:40 AM  Result Value Ref Range Status   Specimen Description BLOOD LEFT HAND  Final   Special Requests BOTTLES DRAWN AEROBIC ONLY 6CC  Final   Culture NO GROWTH 4 DAYS  Final   Report Status PENDING  Incomplete  Culture, blood (routine x 2)     Status: None (Preliminary result)   Collection Time: 04/02/15  9:47 AM  Result Value Ref Range Status   Specimen Description BLOOD RIGHT HAND  Final   Special Requests BOTTLES DRAWN AEROBIC ONLY 6CC  Final   Culture NO GROWTH 4 DAYS  Final   Report Status PENDING  Incomplete  AFB culture with smear     Status: None (Preliminary result)   Collection Time: 04/04/15  1:15 PM  Result Value Ref Range Status   Specimen Description LUNG  Final   Special Requests NONE  Final   Acid Fast Smear   Final    NO ACID FAST BACILLI SEEN Performed at [facility]    Culture   Final  CULTURE WILL BE EXAMINED FOR 6 WEEKS BEFORE ISSUING A FINAL REPORT Performed at [facility]    Report Status PENDING  Incomplete  Culture, body fluid-bottle     Status: None (Preliminary result)   Collection Time: 04/04/15  1:15 PM  Result Value Ref Range Status   Specimen Description PLEURAL  Final   Special Requests BOTTLES DRAWN AEROBIC AND ANAEROBIC 6CC EACH  Final   Culture NO GROWTH 2 DAYS  Final   Report Status PENDING  Incomplete    Scheduled Meds: . acidophilus  1 capsule Oral Daily  . antiseptic oral rinse  7 mL Mouth Rinse BID  . atorvastatin  20 mg Oral QHS  . cholecalciferol  5,000 Units Oral Daily  . citalopram  20 mg Oral QHS  . diltiazem  60 mg Oral 4 times per day  . etomidate      . famotidine  20 mg Oral Daily  . feeding supplement (GLUCERNA SHAKE)  237 mL Oral BID BM  . feeding supplement (PRO-STAT SUGAR FREE 64)  30  mL Oral BID  . furosemide  40 mg Intravenous Q12H  . Influenza vac split quadrivalent PF  0.5 mL Intramuscular Tomorrow-1000  . insulin aspart  0-15 Units Subcutaneous TID WC  . insulin aspart  0-5 Units Subcutaneous QHS  . insulin glargine  10 Units Subcutaneous BID  . levalbuterol  0.63 mg Nebulization Q8H  . lidocaine (cardiac) 100 mg/49ml      . methylPREDNISolone (SOLU-MEDROL) injection  40 mg Intravenous Q12H  . omega-3 acid ethyl esters   Oral Weekly  . pantoprazole  40 mg Oral Daily  . piperacillin-tazobactam (ZOSYN)  IV  3.375 g Intravenous Q8H  . rocuronium      . senna-docusate  2 tablet Oral QHS  . sodium chloride  10-40 mL Intracatheter Q12H  . sodium chloride  3 mL Intravenous Q12H  . spironolactone  25 mg Oral Daily  . succinylcholine      . vancomycin  1,500 mg Intravenous Q24H   Continuous Infusions:   Principal Problem:   Acute respiratory failure with hypoxia (HCC) Active Problems:   Diastolic CHF, acute on chronic (HCC)   Atrial fibrillation with rapid ventricular response (HCC)   CKD (chronic kidney disease), stage IV (HCC)   Nonalcoholic fatty liver disease   Supratherapeutic INR   HCAP (healthcare-associated pneumonia)   Pleural effusion   Diabetes mellitus with nephropathy ([place])   OBESITY   Essential hypertension   Atrial fibrillation ([place])   Long term (current) use of anticoagulants   Hematuria, gross   Acute blood loss anemia   NASH (nonalcoholic steatohepatitis)   Ascites   Malnutrition of moderate degree    Time spent: 45 minutes ICU/critical care time.   Rexene Alberts, MD [facility] Pager [phone]. If 7PM-7AM, please contact night-coverage at [email or web address], password [facility] 04/06/2015, 8:21 AM  LOS: 13 days

## 2015-04-06 NOTE — Progress Notes (Signed)
PT IS NOW INTUBATED. TRANSFER TO [place]. HUSBAND IS AT BEDSIDE.

## 2015-04-06 NOTE — Progress Notes (Signed)
Bladder scan performed. E-md advised of low urine output and bladder scan result 10ml. Orders received.

## 2015-04-06 NOTE — Anesthesia Procedure Notes (Signed)
Procedure Name: Intubation Date/Time: 04/06/2015 9:01 AM Performed by: Charmaine Downs Pre-anesthesia Checklist: Patient identified, Emergency Drugs available, Suction available and Patient being monitored Patient Re-evaluated:Patient Re-evaluated prior to inductionOxygen Delivery Method: Ambu bag Preoxygenation: Pre-oxygenation with 100% oxygen Intubation Type: IV induction, Rapid sequence and Cricoid Pressure applied Ventilation: Mask ventilation without difficulty Laryngoscope Size: Mac and 3 Grade View: Grade II Tube type: Oral Tube size: 7.5 mm Number of attempts: 1 Airway Equipment and Method: Stylet Placement Confirmation: ETT inserted through vocal cords under direct vision,  positive ETCO2,  CO2 detector and breath sounds checked- equal and bilateral Secured at: 22 cm Tube secured with: Tape Dental Injury: Teeth and Oropharynx as per pre-operative assessment

## 2015-04-07 ENCOUNTER — Inpatient Hospital Stay (HOSPITAL_COMMUNITY): Payer: Medicare Other

## 2015-04-07 LAB — CBC WITH DIFFERENTIAL/PLATELET
Basophils Absolute: 0 10*3/uL (ref 0.0–0.1)
Basophils Relative: 0 %
EOS PCT: 0 %
Eosinophils Absolute: 0 10*3/uL (ref 0.0–0.7)
HEMATOCRIT: 27.1 % — AB (ref 36.0–46.0)
Hemoglobin: 8.6 g/dL — ABNORMAL LOW (ref 12.0–15.0)
LYMPHS ABS: 0.8 10*3/uL (ref 0.7–4.0)
LYMPHS PCT: 3 %
MCH: 25.7 pg — AB (ref 26.0–34.0)
MCHC: 31.7 g/dL (ref 30.0–36.0)
MCV: 81.1 fL (ref 78.0–100.0)
MONO ABS: 0.8 10*3/uL (ref 0.1–1.0)
MONOS PCT: 3 %
Neutro Abs: 25.7 10*3/uL — ABNORMAL HIGH (ref 1.7–7.7)
Neutrophils Relative %: 94 %
PLATELETS: 227 10*3/uL (ref 150–400)
RBC: 3.34 MIL/uL — ABNORMAL LOW (ref 3.87–5.11)
RDW: 18.6 % — AB (ref 11.5–15.5)
WBC: 27.3 10*3/uL — ABNORMAL HIGH (ref 4.0–10.5)

## 2015-04-07 LAB — GLUCOSE, CAPILLARY
GLUCOSE-CAPILLARY: 202 mg/dL — AB (ref 65–99)
GLUCOSE-CAPILLARY: 179 mg/dL — AB (ref 65–99)
GLUCOSE-CAPILLARY: 197 mg/dL — AB (ref 65–99)
Glucose-Capillary: 184 mg/dL — ABNORMAL HIGH (ref 65–99)
Glucose-Capillary: 203 mg/dL — ABNORMAL HIGH (ref 65–99)
Glucose-Capillary: 209 mg/dL — ABNORMAL HIGH (ref 65–99)

## 2015-04-07 LAB — PH, BODY FLUID: PH, BODY FLUID: 7.4

## 2015-04-07 LAB — CULTURE, BLOOD (ROUTINE X 2)
CULTURE: NO GROWTH
Culture: NO GROWTH

## 2015-04-07 LAB — BLOOD GAS, ARTERIAL
ACID-BASE DEFICIT: 5.9 mmol/L — AB (ref 0.0–2.0)
BICARBONATE: 18.4 meq/L — AB (ref 20.0–24.0)
Drawn by: 437071
FIO2: 0.9
LHR: 32 {breaths}/min
O2 Saturation: 95.4 %
PEEP/CPAP: 18 cmH2O
PO2 ART: 90.1 mmHg (ref 80.0–100.0)
Patient temperature: 98.6
TCO2: 19.4 mmol/L (ref 0–100)
VT: 430 mL
pCO2 arterial: 32.7 mmHg — ABNORMAL LOW (ref 35.0–45.0)
pH, Arterial: 7.368 (ref 7.350–7.450)

## 2015-04-07 LAB — PHOSPHORUS: Phosphorus: 6.2 mg/dL — ABNORMAL HIGH (ref 2.5–4.6)

## 2015-04-07 LAB — HEPATIC FUNCTION PANEL
ALT: 8 U/L — AB (ref 14–54)
AST: 35 U/L (ref 15–41)
Albumin: 1.6 g/dL — ABNORMAL LOW (ref 3.5–5.0)
Alkaline Phosphatase: 103 U/L (ref 38–126)
BILIRUBIN INDIRECT: 1.8 mg/dL — AB (ref 0.3–0.9)
Bilirubin, Direct: 2.7 mg/dL — ABNORMAL HIGH (ref 0.1–0.5)
TOTAL PROTEIN: 6 g/dL — AB (ref 6.5–8.1)
Total Bilirubin: 4.5 mg/dL — ABNORMAL HIGH (ref 0.3–1.2)

## 2015-04-07 LAB — GRAM STAIN

## 2015-04-07 LAB — BASIC METABOLIC PANEL
ANION GAP: 16 — AB (ref 5–15)
ANION GAP: 13 (ref 5–15)
BUN: 67 mg/dL — ABNORMAL HIGH (ref 6–20)
BUN: 68 mg/dL — AB (ref 6–20)
CALCIUM: 7.5 mg/dL — AB (ref 8.9–10.3)
CO2: 22 mmol/L (ref 22–32)
CO2: 21 mmol/L — ABNORMAL LOW (ref 22–32)
CREATININE: 2.69 mg/dL — AB (ref 0.44–1.00)
Calcium: 7.6 mg/dL — ABNORMAL LOW (ref 8.9–10.3)
Chloride: 95 mmol/L — ABNORMAL LOW (ref 101–111)
Chloride: 97 mmol/L — ABNORMAL LOW (ref 101–111)
Creatinine, Ser: 2.66 mg/dL — ABNORMAL HIGH (ref 0.44–1.00)
GFR calc Af Amer: 20 mL/min — ABNORMAL LOW (ref >60)
GFR, EST AFRICAN AMERICAN: 20 mL/min — AB (ref >60)
GFR, EST NON AFRICAN AMERICAN: 17 mL/min — AB (ref >60)
GFR, EST NON AFRICAN AMERICAN: 17 mL/min — AB (ref >60)
GLUCOSE: 204 mg/dL — AB (ref 65–99)
Glucose, Bld: 351 mg/dL — ABNORMAL HIGH (ref 65–99)
POTASSIUM: 4.4 mmol/L (ref 3.5–5.1)
Potassium: 4.5 mmol/L (ref 3.5–5.1)
SODIUM: 131 mmol/L — AB (ref 135–145)
Sodium: 133 mmol/L — ABNORMAL LOW (ref 135–145)

## 2015-04-07 LAB — MAGNESIUM: Magnesium: 2 mg/dL (ref 1.7–2.4)

## 2015-04-07 LAB — CORTISOL: Cortisol, Plasma: 77.4 ug/dL

## 2015-04-07 LAB — PROTIME-INR
INR: 3.47 — ABNORMAL HIGH (ref 0.00–1.49)
Prothrombin Time: 34.2 seconds — ABNORMAL HIGH (ref 11.6–15.2)

## 2015-04-07 LAB — VANCOMYCIN, RANDOM: Vancomycin Rm: 38 ug/mL

## 2015-04-07 MED ORDER — PHENYLEPHRINE HCL 10 MG/ML IJ SOLN
0.0000 ug/min | INTRAVENOUS | Status: DC
  Administered 2015-04-07 (×3): 100 ug/min via INTRAVENOUS
  Administered 2015-04-08 (×2): 110 ug/min via INTRAVENOUS
  Administered 2015-04-09: 130 ug/min via INTRAVENOUS
  Administered 2015-04-09: 120 ug/min via INTRAVENOUS
  Administered 2015-04-09: 400 ug/min via INTRAVENOUS
  Administered 2015-04-09: 120 ug/min via INTRAVENOUS
  Administered 2015-04-10: 400 ug/min via INTRAVENOUS
  Filled 2015-04-07 (×12): qty 4

## 2015-04-07 MED ORDER — SODIUM CHLORIDE 0.9 % IV BOLUS (SEPSIS)
500.0000 mL | Freq: Once | INTRAVENOUS | Status: AC
  Administered 2015-04-07: 500 mL via INTRAVENOUS

## 2015-04-07 MED ORDER — PRO-STAT SUGAR FREE PO LIQD
60.0000 mL | Freq: Three times a day (TID) | ORAL | Status: DC
  Administered 2015-04-07 – 2015-04-09 (×9): 60 mL
  Filled 2015-04-07 (×12): qty 60

## 2015-04-07 MED ORDER — HYDROCORTISONE NA SUCCINATE PF 100 MG IJ SOLR
50.0000 mg | Freq: Four times a day (QID) | INTRAMUSCULAR | Status: DC
  Administered 2015-04-07 – 2015-04-10 (×11): 50 mg via INTRAVENOUS
  Filled 2015-04-07 (×2): qty 1
  Filled 2015-04-07: qty 2
  Filled 2015-04-07 (×3): qty 1
  Filled 2015-04-07 (×2): qty 2
  Filled 2015-04-07 (×2): qty 1
  Filled 2015-04-07: qty 2
  Filled 2015-04-07: qty 1
  Filled 2015-04-07: qty 2
  Filled 2015-04-07 (×2): qty 1
  Filled 2015-04-07: qty 2
  Filled 2015-04-07 (×2): qty 1

## 2015-04-07 MED ORDER — VITAMIN K1 10 MG/ML IJ SOLN
10.0000 mg | Freq: Once | INTRAVENOUS | Status: AC
  Administered 2015-04-07: 10 mg via INTRAVENOUS
  Filled 2015-04-07: qty 1

## 2015-04-07 MED ORDER — OXEPA PO LIQD
1000.0000 mL | ORAL | Status: DC
  Administered 2015-04-07 – 2015-04-08 (×2): 1000 mL
  Filled 2015-04-07 (×4): qty 1000

## 2015-04-07 NOTE — Progress Notes (Signed)
ABG results from this AM - did not transfer over to [facility]   PH - 7.37 PCO2 - 33  PO2 - 90 HCO3 - 18.4 BE   -5.9 SO2 - 95%

## 2015-04-07 NOTE — Progress Notes (Signed)
Attempted to reduce patient to her 6cc  VT ARDS protocol and she had an episode of desaturation, returned to previous settings and will attempt to decrease VT again later tonight. Patient is currently on of 73ml/kg. RT will continue to monitor.

## 2015-04-07 NOTE — Progress Notes (Signed)
Called to patient's room twice due to desaturation, performed two separate recruitments, patient is currently on 100% FiO2 and 18 of PEEP and has SpO2 of 92%. RT will continue to monitor.

## 2015-04-07 NOTE — Progress Notes (Signed)
eLink Physician-Brief Progress Note Patient Name: Krystal [name] DOB: [birth year] MRN: [id number]   Date of Service  04/07/2015  HPI/Events of Note  Oliguria - Bladder scan is negative for residual. Has received 1.5 liters in bolus already. Creatinine now = 2.69.  eICU Interventions  Will order: 1. 0.9 NaCl 500 mL IV over 30 minutes now. 2.  Attempt to monitor CVP via PICC.     Intervention Category Intermediate Interventions: Oliguria - evaluation and management  Axl Rodino Eugene 04/07/2015, 1:07 AM

## 2015-04-07 NOTE — Progress Notes (Signed)
Nutrition Follow-up / Consult  DOCUMENTATION CODES:   Morbid obesity, Non-severe (moderate) malnutrition in context of acute illness/injury  INTERVENTION:    Initiate TF via OGT with Oxepa at 25 ml/h and Prostat 60 ml TID on day 1; on day 2, increase to goal rate of 40 ml/h (960 ml per day) to provide 2040 kcals, 150 gm protein, 754 ml free water daily.  NUTRITION DIAGNOSIS:   Inadequate oral intake related to inability to eat as evidenced by NPO status.  Ongoing  GOAL:   Provide needs based on ASPEN/SCCM guidelines  Unmet  MONITOR:   Vent status, TF tolerance, Skin, Weight trends, Labs, I & O's  REASON FOR ASSESSMENT:   Consult Enteral/tube feeding initiation and management  ASSESSMENT:   69 year old obese female with diastolic heart failure and NASH related to obesity and Avandia reportedly. Recently admitted to [facility] on 10/10. Required intubation on 10/23 and transferred to [facility].  Patient is currently intubated on ventilator support, ARDS protocol in place. Received MD Consult for TF initiation and management. Temp (24hrs), Avg:97.8 F (36.6 C), Min:97.4 F (36.3 C), Max:98.1 F (36.7 C)   Diet Order:  Diet NPO time specified  Skin:  Wound (see comment) (stage II pressure injury to buttocks)  Last BM:  10/20  Height:   Ht Readings from Last 1 Encounters:  04/06/15 5\' 7"  (1.702 m)    Weight:   Wt Readings from Last 1 Encounters:  04/07/15 321 lb 3.4 oz (145.7 kg)    Ideal Body Weight:  61.36 kg  BMI:  Body mass index is 50.3 kg/(m^2).  Estimated Nutritional Needs:   Kcal:  8937-3428  Protein:  150 gm  Fluid:  1.6-2 L  EDUCATION NEEDS:   No education needs identified at this time  Molli Barrows, [place], [place], [place] Pager [phone] After Hours Pager [phone]

## 2015-04-07 NOTE — Progress Notes (Signed)
PULMONARY / CRITICAL CARE MEDICINE   Name: Krystal [name] MRN: [id number] DOB: [birth year]    ADMISSION DATE:  03/16/2015 CONSULTATION DATE:  04/06/2015   REFERRING MD :  Dr. Rexene Alberts from [facility] hospitalist service  CHIEF COMPLAINT:  Acute respiratory failure with pulmonary infiltrates intubated  SIGNIFICANT EVENTS: 03/15/2015 - admitted [facility] 03/25/2015 echocardiogram ejection fraction 65% with severe right ventricular dysfunction. 04/06/2015-intubated at [facility] and transferred to [facility]   HISTORY OF PRESENT ILLNESS:    The history is obtained from the husband and review of the charts. Patient is unable to give a history because of intubated status.  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 History of normal cardiac catheterization and according to the husband and the recent 2 months. Admitted to [facility] 03/23/2015 due to decreased ability to self-care and also respirator distress. Chest x-ray this time showed possible left lower lobe infiltrate. According to the husband and hospitalist initial diagnosis was acute on chronic diastolic heart failure. Patient was subjected to diuresis. According to the husband for the first 10 days or so patient maintained at 2-4 liters of nasal cannula oxygen. Then set having worsening hypoxemia requiring facemask oxygen. Chest x-ray 03/30/2014 showed definite left lower lobe consolidation which continued to get worse with near left-sided white out over the ensuing few days. 04/04/2015 patient was subjected to 1.4 L left-sided thoracentesis. Fluid was reported as brownish. This was an exudate with elevated LDH of 222. Subsequent to this on 04/05/2059 and patient about new onset right-sided infiltrates and according to the husband was placed on BiPAP. Infiltrates got worse bilaterally 04/06/2015 and patient was intubated.  Required high flow oxygen and PEEP on the ventilator and [facility]. Critical care medicine is no primary service from 04/06/2015. Currently patient on propofol and fentanyl infusions and that is mild hypotension with this. Prior to intubation patient was normotensive and according to the husband had intact mental status.  Off note patient admission creatinine was 1.6 mg percent and this held steady through 04/04/2015. At the time of transfer creatinine had risen to 2.37 mg percent. Presumably due to Lasix  Subjective: No events overnight.  VITAL SIGNS: Temp:  [97.4 F (36.3 C)-98.1 F (36.7 C)] 98.1 F (36.7 C) (10/24 1015) Pulse Rate:  [69-84] 84 (10/24 1115) Resp:  [11-35] 32 (10/24 1100) BP: (81-134)/(32-65) 102/47 mmHg (10/24 1115) SpO2:  [86 %-97 %] 96 % (10/24 1115) FiO2 (%):  [80 %-100 %] 90 % (10/24 1115) Weight:  [145.7 kg (321 lb 3.4 oz)] 145.7 kg (321 lb 3.4 oz) (10/24 0415)   HEMODYNAMICS: CVP:  [11 mmHg-22 mmHg] 22 mmHg  VENTILATOR SETTINGS: Vent Mode:  [-] PRVC FiO2 (%):  [80 %-100 %] 90 % Set Rate:  [30 bmp-35 bmp] 32 bmp Vt Set:  [370 mL-490 mL] 430 mL PEEP:  [14 cmH20-18 cmH20] 18 cmH20 Plateau Pressure:  [28 SFK81-27 cmH20] 34 cmH20  INTAKE / OUTPUT:  Intake/Output Summary (Last 24 hours) at 04/07/15 1237 Last data filed at 04/07/15 1100  Gross per 24 hour  Intake 2941.52 ml  Output    354 ml  Net 2587.52 ml   PHYSICAL EXAMINATION: General: Morbidly obese female intubated looks critically ill HEENT:  Intubated. Short neck. Cardio: Heart rate 80. Normal heart sounds, IRIR, Nl S1/S2, -M/R/G. Lungs:Synchronous with the ventilator. Scattered crackles Abdomen: Obese, soft normal bowel sounds Muscular: Bilateral 3+ edema  Skin: Intact anteriorly  LABS: PULMONARY  Recent Labs Lab 04/06/15 0325 04/06/15 0945 04/06/15 1512 04/06/15 2110 04/07/15 0400  PHART 7.451* 7.350 7.423 7.441 7.368  PCO2ART 31.2* 39.6 31.1* 28.0* 32.7*   PO2ART 59.0* 54.4* 87.8 64.9* 90.1  HCO3 22.9 21.2 19.9* 18.9* 18.4*  TCO2  --   --  20.9 19.7 19.4  O2SAT 86.6 80.2 95.9 92.5 95.4   CBC  Recent Labs Lab 04/05/15 0441 04/06/15 0435 04/07/15 0445  HGB 8.8* 8.6* 8.6*  HCT 27.1* 27.0* 27.1*  WBC 25.1* 26.5* 27.3*  PLT 244 241 227   COAGULATION  Recent Labs Lab 04/03/15 0407 04/04/15 0415 04/05/15 0441 04/06/15 0435 04/07/15 0445  INR 2.26* 1.75* 1.96* 2.57* 3.47*   CARDIAC  No results for input(s): TROPONINI in the last 168 hours. No results for input(s): PROBNP in the last 168 hours.  CHEMISTRY  Recent Labs Lab 04/04/15 0415 04/05/15 0441 04/06/15 0435 04/06/15 2220 04/07/15 0445  NA 133* 134* 134* 133* 131*  K 4.1 4.2 4.3 4.5 4.4  CL 94* 95* 96* 95* 97*  CO2 22 22 22 22  21*  GLUCOSE 200* 209* 193* 204* 351*  BUN 37* 52* 63* 67* 68*  CREATININE 1.63* 1.90* 2.37* 2.69* 2.66*  CALCIUM 8.9 8.4* 7.8* 7.5* 7.6*  MG  --   --   --   --  2.0  PHOS  --   --   --   --  6.2*   Estimated Creatinine Clearance: 30.4 mL/min (by C-G formula based on Cr of 2.66).  LIVER  Recent Labs Lab 04/01/15 0615  04/03/15 0407 04/04/15 0415 04/05/15 0441 04/06/15 0435 04/07/15 0445  AST 29  --   --   --   --   --  35  ALT 7*  --   --   --   --   --  8*  ALKPHOS 142*  --   --   --   --   --  103  BILITOT 4.8*  --   --   --   --   --  4.5*  PROT 7.2  --   --   --   --   --  6.0*  ALBUMIN 2.4*  --   --   --   --   --  1.6*  INR 8.07*  < > 2.26* 1.75* 1.96* 2.57* 3.47*  < > = values in this interval not displayed.   INFECTIOUS No results for input(s): LATICACIDVEN, PROCALCITON in the last 168 hours.  ENDOCRINE CBG (last 3)   Recent Labs  04/07/15 0333 04/07/15 0727 04/07/15 1225  GLUCAP 202* 209* 179*   IMAGING x48h  - image(s) personally visualized  -   highlighted in bold Dg Chest Port 1 View  04/06/2015  CLINICAL DATA:  Intubation, nasogastric tube placement EXAM: PORTABLE CHEST 1 VIEW COMPARISON:   Portable exam 0932 hours compared to 04/05/2015 FINDINGS: Tip of endotracheal tube projects 3.7 cm above carina. Nasogastric tube extends into stomach. RIGHT arm PICC line tip projects over SVC. Enlargement of cardiac silhouette with atherosclerotic calcification aorta. BILATERAL airspace infiltrates persist, least severe in LEFT upper lobe. No definite pleural effusion or pneumothorax. No acute osseous findings. IMPRESSION: Enlargement of cardiac silhouette. BILATERAL pulmonary infiltrates RIGHT greater than LEFT little changed. Electronically Signed   By: Lavonia Dana M.D.   On: 04/06/2015 10:03   Dg Chest Port 1 View  04/05/2015  CLINICAL DATA:  Shortness of breath.  Hypoxia. EXAM: PORTABLE CHEST  1 VIEW COMPARISON:  Same day. FINDINGS: Stable cardiomegaly. Right-sided PICC line is unchanged in position. Stable right lung opacity is noted most consistent with pneumonia or possibly edema. No pneumothorax is noted. Increased aeration of left upper lobe is noted, although significant left basilar opacity is noted concerning for pneumonia or edema with associated pleural effusion. Bony thorax is unremarkable. IMPRESSION: Stable diffuse right lung opacity consistent with pneumonia or edema. Stable left basilar opacity is noted concerning for pneumonia or edema with associated pleural effusion. Electronically Signed   By: Marijo Conception, M.D.   On: 04/05/2015 20:45   Dg Chest Port 1 View  04/05/2015  CLINICAL DATA:  Line placement. EXAM: PORTABLE CHEST 1 VIEW COMPARISON:  April 04, 2015. FINDINGS: Stable cardiomegaly. No pneumothorax is noted. Interval placement of right-sided PICC line with distal tip in expected position of the SVC. Increased right basilar opacity is noted concerning for pneumonia or edema with associated pleural effusion. Increased right upper lobe opacity is noted concerning for pneumonia or edema. There is increased aeration of the left upper lobe, but large opacity remains in the left  lung concerning for pneumonia or atelectasis with associated pleural effusion. Bony thorax is unremarkable. IMPRESSION: Large opacity remains in the left lung concerning for pneumonia or atelectasis with associated pleural effusion, although increased aeration of left upper lobe is noted. However, increased opacities are noted in the right upper and lower lobes, consistent with pneumonia or edema with associated pleural effusion. Interval placement of right-sided PICC line with distal tip in expected position of the SVC. Electronically Signed   By: Marijo Conception, M.D.   On: 04/05/2015 13:58   US Abdomen Limited Ruq  04/07/2015  CLINICAL DATA:  Nonalcoholic steatohepatitis. Further evaluation requested. Subsequent encounter. EXAM: US ABDOMEN LIMITED - RIGHT UPPER QUADRANT COMPARISON:  Abdominal ultrasound performed 06/15/2011, and CT of the abdomen and pelvis performed 04/01/2015 FINDINGS: Gallbladder: No gallstones or wall thickening visualized. No sonographic Murphy sign noted. Common bile duct: Diameter: 0.5 cm, within normal limits in caliber. Liver: No focal lesion identified. Within normal limits in parenchymal echogenicity. A small right-sided pleural effusion is noted. Trace ascites is noted about the liver. IMPRESSION: 1. No acute abnormality seen at the right upper quadrant. 2. Trace ascites noted about the liver. 3. Small right-sided pleural effusion noted. Electronically Signed   By: Garald Balding M.D.   On: 04/07/2015 02:07   ASSESSMENT / PLAN:  PULMONARY OETT 04/06/2015  A: Acute hypoxemic respiratory failure at admission 03/18/2015 due to acute diastolic heart failure. Status post thoracentesis 1.4 L 04/04/2015. New infiltrates on the contralateral right-sided 04/05/2015 and intubated 04/06/2015. ARDS physiology 04/06/2015  - Differential diagnoses hospital-acquired pneumonia, versus reexpansion pulmonary edema from thoracentesis  P:   Change to PCV to asynchrony. Hold off on  diuresis due to hypotension but would greatly benefit from diureses. CT chest without contrast when more stable. DCed Solu-Medrol, will continue to hold for now. Await pleural fluid cytology from 04/03/2005 seen left side Chest CT with diffuse infiltrate.  CARDIOVASCULAR CVL PICC LINE PLACED AT [place]  A: history of diastolic heart failure and cor pulmonale  History of A. fib RVR but here in sinus rhythm  is on chronic anticoagulation with Coumadin  P:  Hold off on diuresis Stop Cardizem infusion Continue anticoagulation for pharmacy  RENAL A:  chronic kidney disease at baseline creatinine 1.5 mg percent Acute kidney injury since 04/05/2015. Current creatinine 2.37 mg percent 04/06/2015 P:   Hold Lasix  and spironolactone given hemodynamics. Tele monitor.  GASTROINTESTINAL A:  respiratory distress. Hx of NASH but no cirrhosis per husband   P:   DC omega-3 and spironolactone Right upper quadrant ultrasound with no acute abnormalities, trace ascites. TF per nutrition. Protonix  HEMATOAnemia of chronic disease and critical illness P:  PRBC for hemoglobin less than 7 g percent on effectively bleeding  INFECTIOUS Tracheal aspirate bacterial 04/06/2015 Blood culture 04/06/2015 Urine culture 04/06/2015  A:  possible H Hospital-acquired pneumonia 04/05/2015 P:   Doxycycline 03/21/2015 - 2 03/22/2015 Ceftriaxone 03/22/2015 through 04/07/2015 Levofloxacin 04/01/2015 through 04/03/2015 Vancomycin 04/02/2015  >> 10/24 (no UOP however). Levofloxacin 04/03/2015 through 04/05/2015 Zosyn 04/05/2015 >>  ENDOCRINE A: NASH    P:   SSI Check cortisol level. Hydrocortisone 50 mg IV q6 hours.  NEUROLOGIC A:  Normal mental status prior to intubation 04/06/2015. History of depression present and chronic opioid intake  P:   Continue home Celexa for now Fentanyl drip plus fentanyl as needed  Versed gtt if needed RASS goal: -3  FAMILY  - Updates:   No family bedside.  -  Inter-disciplinary family meet or Palliative Care meeting due by:  04/13/2015 - done at initial intake with the husband and nurse present. Full code  The patient is critically ill with multiple organ systems failure and requires high complexity decision making for assessment and support, frequent evaluation and titration of therapies, application of advanced monitoring technologies and extensive interpretation of multiple databases.   Critical Care Time devoted to patient care services described in this note is  35  Minutes. This time reflects time of care of this signee Dr Jennet Maduro. This critical care time does not reflect procedure time, or teaching time or supervisory time of PA/NP/Med student/Med Resident etc but could involve care discussion time.  Rush Farmer, M.D. [facility] Pulmonary/Critical Care Medicine. Pager: [phone]. After hours pager: [phone].  04/07/2015 12:37 PM

## 2015-04-08 ENCOUNTER — Inpatient Hospital Stay (HOSPITAL_COMMUNITY): Payer: Medicare Other

## 2015-04-08 LAB — POCT I-STAT 3, ART BLOOD GAS (G3+)
ACID-BASE DEFICIT: 6 mmol/L — AB (ref 0.0–2.0)
BICARBONATE: 18.4 meq/L — AB (ref 20.0–24.0)
O2 SAT: 89 %
PO2 ART: 56 mmHg — AB (ref 80.0–100.0)
Patient temperature: 97.9
TCO2: 19 mmol/L (ref 0–100)
pCO2 arterial: 32 mmHg — ABNORMAL LOW (ref 35.0–45.0)
pH, Arterial: 7.365 (ref 7.350–7.450)

## 2015-04-08 LAB — CBC WITH DIFFERENTIAL/PLATELET
BASOS PCT: 0 %
Basophils Absolute: 0 10*3/uL (ref 0.0–0.1)
EOS PCT: 0 %
Eosinophils Absolute: 0 10*3/uL (ref 0.0–0.7)
HEMATOCRIT: 28.2 % — AB (ref 36.0–46.0)
Hemoglobin: 8.9 g/dL — ABNORMAL LOW (ref 12.0–15.0)
LYMPHS PCT: 3 %
Lymphs Abs: 1 10*3/uL (ref 0.7–4.0)
MCH: 25.6 pg — ABNORMAL LOW (ref 26.0–34.0)
MCHC: 31.6 g/dL (ref 30.0–36.0)
MCV: 81.3 fL (ref 78.0–100.0)
MONO ABS: 1 10*3/uL (ref 0.1–1.0)
Monocytes Relative: 3 %
Neutro Abs: 32.7 10*3/uL — ABNORMAL HIGH (ref 1.7–7.7)
Neutrophils Relative %: 94 %
Platelets: 190 10*3/uL (ref 150–400)
RBC: 3.47 MIL/uL — ABNORMAL LOW (ref 3.87–5.11)
RDW: 18.8 % — ABNORMAL HIGH (ref 11.5–15.5)
WBC: 34.7 10*3/uL — AB (ref 4.0–10.5)

## 2015-04-08 LAB — BASIC METABOLIC PANEL
Anion gap: 17 — ABNORMAL HIGH (ref 5–15)
BUN: 80 mg/dL — AB (ref 6–20)
CHLORIDE: 93 mmol/L — AB (ref 101–111)
CO2: 19 mmol/L — ABNORMAL LOW (ref 22–32)
Calcium: 7.6 mg/dL — ABNORMAL LOW (ref 8.9–10.3)
Creatinine, Ser: 3.1 mg/dL — ABNORMAL HIGH (ref 0.44–1.00)
GFR calc Af Amer: 17 mL/min — ABNORMAL LOW (ref >60)
GFR calc non Af Amer: 14 mL/min — ABNORMAL LOW (ref >60)
GLUCOSE: 330 mg/dL — AB (ref 65–99)
POTASSIUM: 4.4 mmol/L (ref 3.5–5.1)
SODIUM: 129 mmol/L — AB (ref 135–145)

## 2015-04-08 LAB — GLUCOSE, CAPILLARY
GLUCOSE-CAPILLARY: 220 mg/dL — AB (ref 65–99)
Glucose-Capillary: 191 mg/dL — ABNORMAL HIGH (ref 65–99)
Glucose-Capillary: 221 mg/dL — ABNORMAL HIGH (ref 65–99)

## 2015-04-08 LAB — PHOSPHORUS: Phosphorus: 6.8 mg/dL — ABNORMAL HIGH (ref 2.5–4.6)

## 2015-04-08 LAB — PROTIME-INR
INR: 2.21 — AB (ref 0.00–1.49)
PROTHROMBIN TIME: 24.3 s — AB (ref 11.6–15.2)

## 2015-04-08 LAB — MAGNESIUM: Magnesium: 2 mg/dL (ref 1.7–2.4)

## 2015-04-08 MED ORDER — INSULIN GLARGINE 100 UNIT/ML ~~LOC~~ SOLN
10.0000 [IU] | Freq: Two times a day (BID) | SUBCUTANEOUS | Status: DC
  Administered 2015-04-08: 10 [IU] via SUBCUTANEOUS
  Filled 2015-04-08 (×2): qty 0.1

## 2015-04-08 MED ORDER — FUROSEMIDE 10 MG/ML IJ SOLN
160.0000 mg | Freq: Four times a day (QID) | INTRAVENOUS | Status: DC
  Administered 2015-04-08 – 2015-04-10 (×7): 160 mg via INTRAVENOUS
  Filled 2015-04-08 (×9): qty 16

## 2015-04-08 MED ORDER — PIPERACILLIN-TAZOBACTAM IN DEX 2-0.25 GM/50ML IV SOLN
2.2500 g | Freq: Three times a day (TID) | INTRAVENOUS | Status: DC
  Administered 2015-04-08 – 2015-04-09 (×2): 2.25 g via INTRAVENOUS
  Filled 2015-04-08 (×4): qty 50

## 2015-04-08 NOTE — Clinical Social Work Note (Signed)
Patient has a bed at SNF, [facility] once medically stable. Clinical Social Worker will continue to check on bed availability/status once patient becomes closer to being medically stable.   CSW will continue to follow patient and family for continued support and to facilitate pt's discharge once medically stable.   Glendon Axe, MSW, LCSWA [phone] 04/08/2015 7:36 PM

## 2015-04-08 NOTE — Progress Notes (Signed)
ANTIBIOTIC CONSULT NOTE - FOLLOW UP  Pharmacy Consult for Zosyn Indication: rule out pneumonia  Allergies  Allergen Reactions  . Ace Inhibitors Swelling  . Actos [Pioglitazone Hydrochloride] Other (See Comments)    Liver Issues  . Aspirin Other (See Comments)    Was instructed not to take due to Coumadin  . Celecoxib Swelling  . Diovan [Valsartan] Other (See Comments)    Cough.   . Metformin And Related Other (See Comments)    Liver issues  . Tylenol [Acetaminophen] Other (See Comments)    Liver Issues    Patient Measurements: Height: 5\' 7"  (170.2 cm) Weight: (!) 325 lb 6.4 oz (147.6 kg) IBW/kg (Calculated) : 61.6 Vital Signs: Temp: 99.5 F (37.5 C) (10/25 1210) Temp Source: Oral (10/25 1210) BP: 113/51 mmHg (10/25 1015) Pulse Rate: 88 (10/25 1015) Intake/Output from previous day: 10/24 0701 - 10/25 0700 In: 1640 [I.V.:1065; NG/GT:375; IV Piggyback:200] Out: 55 [Urine:55] Intake/Output from this shift: Total I/O In: 458 [I.V.:308; NG/GT:150] Out: 5 [Urine:5]  Labs:  Recent Labs  04/06/15 0435 04/06/15 2220 04/07/15 0445 04/08/15 0342  WBC 26.5*  --  27.3* 34.7*  HGB 8.6*  --  8.6* 8.9*  PLT 241  --  227 190  CREATININE 2.37* 2.69* 2.66* 3.10*   Estimated Creatinine Clearance: 26.3 mL/min (by C-G formula based on Cr of 3.1).  Recent Labs  04/06/15 0915 04/07/15 0445  VANCOTROUGH 46*  --   VANCORANDOM  --  38  Vancomycin discontinued however now no UOP so likely remains on board.   Microbiology: 10/23 Resp: ngtd 10/23 MRSA pcr negative 10/21 Pleural fluid: ngtd 10/19 Blood: ngtd 10/13 Urine: neg  Assessment: 69 year old female on Zosyn for HCAP. Currently no UOP despite calculated CrCl of 26. Nephrology consulted for dialysis.   Goal of Therapy:  Clinical resolution of infection  Plan:  Adjust Zosyn to 2.25g IV q8h due to no UOP despite CrCl >20. Follow-up plans per renal for potential dialysis.   Sloan Leiter, PharmD, BCPS Clinical  Pharmacist [phone] 04/08/2015,1:05 PM
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
## 2015-04-08 NOTE — Progress Notes (Signed)
Inpatient Diabetes Program Recommendations  AACE/ADA: New Consensus Statement on Inpatient Glycemic Control (2015)  Target Ranges:  Prepandial:   less than 140 mg/dL      Peak postprandial:   less than 180 mg/dL (1-2 hours)      Critically ill patients:  140 - 180 mg/dL    Results for DEALVA, LAFOY (MRN [id number]) as of 04/08/2015 09:09  Ref. Range 04/06/2015 23:43 04/07/2015 03:33 04/07/2015 07:27 04/07/2015 12:25 04/07/2015 16:21 04/07/2015 19:23  Glucose-Capillary Latest Ref Range: 65-99 mg/dL 184 (H) 202 (H) 209 (H) 179 (H) 197 (H) 203 (H)    Home DM Meds: Humalog 15 units QAM (not sure patient is taking insulin at home)  Current Insulin Orders: Lantus 5 units bid      Novolog 2-4-6 units Q4 hours (per ICU Glycemic Control Protocol)      MD- Please have nursing start IV Insulin drip per Phase 3 of ICU Glycemic Control Protocol    --Will follow patient during hospitalization--  Wyn Quaker RN, MSN, CDE Diabetes Coordinator Inpatient Glycemic Control Team Team Pager: [phone] (8a-5p)

## 2015-04-08 NOTE — Progress Notes (Signed)
Utilization review complete. Humzah Harty RN CCM Case Mgmt phone [phone] 

## 2015-04-08 NOTE — Progress Notes (Signed)
PULMONARY / CRITICAL CARE MEDICINE   Name: Krystal [name] MRN: [id number] DOB: 1946-01-08    ADMISSION DATE:  03/30/2015 CONSULTATION DATE:  04/06/2015   REFERRING MD :  Dr. Rexene Alberts from [facility] hospitalist service  Brief history  SIGNIFICANT EVENTS: 03/26/2015 - admitted [facility] 03/25/2015 echocardiogram ejection fraction 65% with severe right ventricular dysfunction. 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 [facility]2015 w/ working dx of PNA + decop diastolic HF. Hosp course c/b progressive hypoxia, left exudative pleural effusion (drained 1.4 liters on 10/21) and eventually worsening bilateral airspace disease which required intubation on 10/23. Marland Kitchen Required high oxygen and PEEP on the ventilator and transferred to [facility].  Of note patient admission creatinine was 1.6 mg percent and this held steady through 04/04/2015. At the time of transfer creatinine had risen to 2.37 mg percent. Presumably due to Lasix   Subjective: No events overnight.  VITAL SIGNS: Temp:  [97.8 F (36.6 C)-99 F (37.2 C)] 99 F (37.2 C) (10/25 0820) Pulse Rate:  [72-95] 88 (10/25 0830) Resp:  [19-32] 32 (10/25 0830) BP: (99-131)/(40-79) 117/47 mmHg (10/25 0830) SpO2:  [89 %-99 %] 99 % (10/25 0830) FiO2 (%):  [90 %-100 %] 100 % (10/25 0800) Weight:  [147.6 kg (325 lb 6.4 oz)] 147.6 kg (325 lb 6.4 oz) (10/25 0354)   HEMODYNAMICS: CVP:  [17 mmHg-22 mmHg] 19 mmHg  VENTILATOR SETTINGS: Vent Mode:  [-] PCV FiO2 (%):  [90 %-100 %] 100 % Set Rate:  [32 bmp] 32 bmp Vt Set:  [22 mL-430 mL] 22 mL PEEP:  [16 cmH20-18 cmH20] 18 cmH20 Plateau Pressure:  [34 cmH20-62 cmH20] 37 cmH20  INTAKE / OUTPUT:  Intake/Output Summary (Last 24 hours) at 04/08/15  0905 Last data filed at 04/08/15 0900  Gross per 24 hour  Intake   1923 ml  Output     50 ml  Net   1873 ml   PHYSICAL EXAMINATION: General: Morbidly obese intubated female, alert HEENT:  NCAT, orally intubated. MMM.  Cardio: RRR, S1/S2, -M/R/G. Lungs: Diffuse crackles on Rt, L clear in apex, diminished in base  Abdomen: Obese, non-tender, +bs  Muscular: Bilateral 3+ edema Skin: Intact, scattered bruising noted   LABS: PULMONARY  Recent Labs Lab 04/06/15 0945 04/06/15 1512 04/06/15 2110 04/07/15 0400 04/08/15 0409  PHART 7.350 7.423 7.441 7.368 7.365  PCO2ART 39.6 31.1* 28.0* 32.7* 32.0*  PO2ART 54.4* 87.8 64.9* 90.1 56.0*  HCO3 21.2 19.9* 18.9* 18.4* 18.4*  TCO2  --  20.9 19.7 19.4 19  O2SAT 80.2 95.9 92.5 95.4 89.0   CBC  Recent Labs Lab 04/06/15 0435 04/07/15 0445 04/08/15 0342  HGB 8.6* 8.6* 8.9*  HCT 27.0* 27.1* 28.2*  WBC 26.5* 27.3* 34.7*  PLT 241 227 190   COAGULATION  Recent Labs Lab 04/04/15 0415 04/05/15 0441 04/06/15 0435 04/07/15 0445 04/08/15 0342  INR 1.75* 1.96* 2.57* 3.47* 2.21*   CARDIAC  No results for input(s): TROPONINI in the last 168 hours. No results for input(s): PROBNP in the last 168 hours.  CHEMISTRY  Recent Labs Lab 04/05/15 0441 04/06/15 0435 04/06/15 2220 04/07/15 0445 04/08/15 0342  NA 134* 134* 133* 131* 129*  K 4.2 4.3 4.5 4.4 4.4  CL 95* 96* 95* 97* 93*  CO2 22 22 22  21* 19*  GLUCOSE 209* 193* 204* 351* 330*  BUN 52* 63* 67* 68* 80*  CREATININE 1.90* 2.37* 2.69* 2.66* 3.10*  CALCIUM 8.4* 7.8* 7.5* 7.6* 7.6*  MG  --   --   --  2.0 2.0  PHOS  --   --   --  6.2* 6.8*   Estimated Creatinine Clearance: 26.3 mL/min (by C-G formula based on Cr of 3.1).  LIVER  Recent Labs Lab 04/04/15 0415 04/05/15 0441 04/06/15 0435 04/07/15 0445 04/08/15 0342  AST  --   --   --  35  --   ALT  --   --   --  8*  --   ALKPHOS  --   --   --  103  --   BILITOT  --   --   --  4.5*  --   PROT  --   --   --  6.0*  --    ALBUMIN  --   --   --  1.6*  --   INR 1.75* 1.96* 2.57* 3.47* 2.21*     INFECTIOUS No results for input(s): LATICACIDVEN, PROCALCITON in the last 168 hours.  ENDOCRINE CBG (last 3)   Recent Labs  04/07/15 1621 04/07/15 1923 04/08/15 0319  GLUCAP 197* 203* 191*   IMAGING x48h  - image(s) personally visualized  -   highlighted in bold Ct Chest Wo Contrast  04/07/2015  CLINICAL DATA:  Acute respiratory distress and diastolic heart failure with recent LEFT thoracentesis. EXAM: CT CHEST WITHOUT CONTRAST TECHNIQUE: Multidetector CT imaging of the chest was performed following the standard protocol without IV contrast. COMPARISON:  Radiograph 04/06/2015, 04/04/2015, CT abdomen 04/01/15 FINDINGS: Mediastinum/Nodes: No axillary supraclavicular lymphadenopathy. Endotracheal tube in good position. A 10 mm paratracheal subcarinal lymph nodes present. No pericardial effusion. RIGHT PICC line with tip at the cavoatrial junction. Lungs/Pleura: There is dense consolidation in the RIGHT upper lobe with air bronchograms. Diffuse ground-glass opacity with air bronchograms in the RIGHT middle lobe. Dense consolidation in the RIGHT lower lobe. Consolidation atelectasis in the LEFT lower lobe. There are bilateral pleural effusions which are mild to moderate. No pneumothorax. NG tube extends the stomach. Upper abdomen: NG tube extends the stomach. Anasarca of soft tissues. There is a large fluid collection along the LEFT upper abdomen which runs along the anterior margin of the ribs measuring 18 by 10 cm on image 62, series 5. This fluid is low-attenuation and is consistent with ascites is seen on CT of 04/01/2015. There is anasarca of the soft tissues typically in the LEFT. Musculoskeletal: No aggressive osseous lesion. IMPRESSION: 1. Dense bilateral pneumonia with consolidation and ground-glass opacities. 2. Mild to moderate bilateral pleural effusions. 3. Large volume ascites in the abdomen which extends into the  LEFT upper quadrant anteriorly. Electronically Signed   By: Suzy Bouchard M.D.   On: 04/07/2015 12:44   Dg Chest Port 1 View  04/08/2015  CLINICAL DATA:  Intubated patient, CHF, or respiratory failure. EXAM: PORTABLE CHEST 1 VIEW COMPARISON:  CT scan chest of April 07, 2015 and portable chest x-ray of April 06, 2015. FINDINGS: Confluent alveolar opacity persists in the right upper lobe and at the right lung base. The left hemidiaphragm remains obscured by alveolar opacities. The cardiac silhouette is top-normal in size. The pulmonary vascularity is not clearly engorged. The endotracheal tube tip lies 4.8 cm above the carina. The PICC line tip projects over the junction of the middle and distal thirds of the SVC. The esophagogastric tube appears  to extend below the inferior margin of the image. IMPRESSION: Persistent alveolar opacities most compatible with pneumonia. Bilateral pleural effusions are suspected. Stable mild enlargement of the cardiac silhouette with slightly decreased conspicuity of the pulmonary vascularity. Electronically Signed   By: David  Martinique M.D.   On: 04/08/2015 07:32   Dg Chest Port 1 View  04/06/2015  CLINICAL DATA:  Intubation, nasogastric tube placement EXAM: PORTABLE CHEST 1 VIEW COMPARISON:  Portable exam 0932 hours compared to 04/05/2015 FINDINGS: Tip of endotracheal tube projects 3.7 cm above carina. Nasogastric tube extends into stomach. RIGHT arm PICC line tip projects over SVC. Enlargement of cardiac silhouette with atherosclerotic calcification aorta. BILATERAL airspace infiltrates persist, least severe in LEFT upper lobe. No definite pleural effusion or pneumothorax. No acute osseous findings. IMPRESSION: Enlargement of cardiac silhouette. BILATERAL pulmonary infiltrates RIGHT greater than LEFT little changed. Electronically Signed   By: Lavonia Dana M.D.   On: 04/06/2015 10:03   US Abdomen Limited Ruq  04/07/2015  CLINICAL DATA:  Nonalcoholic steatohepatitis.  Further evaluation requested. Subsequent encounter. EXAM: US ABDOMEN LIMITED - RIGHT UPPER QUADRANT COMPARISON:  Abdominal ultrasound performed 06/15/2011, and CT of the abdomen and pelvis performed 04/01/2015 FINDINGS: Gallbladder: No gallstones or wall thickening visualized. No sonographic Murphy sign noted. Common bile duct: Diameter: 0.5 cm, within normal limits in caliber. Liver: No focal lesion identified. Within normal limits in parenchymal echogenicity. A small right-sided pleural effusion is noted. Trace ascites is noted about the liver. IMPRESSION: 1. No acute abnormality seen at the right upper quadrant. 2. Trace ascites noted about the liver. 3. Small right-sided pleural effusion noted. Electronically Signed   By: Garald Balding M.D.   On: 04/07/2015 02:07   ASSESSMENT / PLAN:  PULMONARY OETT 04/06/2015  A: Acute hypoxemic respiratory failure at admission 03/18/2015 due to acute diastolic heart failure.  S/p L sided thoracentesis 1.4 L 04/04/2015 > Exudative pleural effusion  New infiltrates on the contralateral right-sided 04/05/2015 and intubated 04/06/2015. ARDS physiology 04/06/2015   - Differential diagnoses hospital-acquired pneumonia versus reexpansion pulmonary edema from thoracentesis  P:   Full vent support  Await pleural fluid cytology from 04/03/2005 seen left side See ID  Check abd [place], then Consider paracentesis to assist w/ lung mechanics.    CARDIOVASCULAR CVL PICC LINE PLACED AT [place]  A: Hypotension > Likely from sepsis  History of diastolic heart failure and cor pulmonale  History of A. fib RVR but here in sinus rhythm > is on chronic anticoagulation with Coumadin  P:  Neo-synephrine for MAP >65  Check Lactic  Hold off on diuresis d/t kidney function  Continue anticoagulation for pharmacy  RENAL A:   AG Metabolic Acidosis  CKD at baseline creatinine 1.5 mg percent Acute kidney injury since 04/05/2015 > Worsening Cr 10/25 -->3.10  P:    Monitor UOP Renal Dose Meds  Hold Lasix and spironolactone   Tele monitor.  GASTROINTESTINAL A:   Hx of NASH but no cirrhosis per husband  ascites  P:   TF per nutrition. Protonix  HEMATOLOGY  A:  Anemia of chronic disease and critical illness P:  PRBC for hemoglobin less than 7 g percent on effectively bleeding  INFECTIOUS Tracheal aspirate bacterial 04/06/2015>> Blood culture 04/06/2015>> Urine culture 04/06/2015>>  A:   Possible HCAP 04/05/2015 P:   Doxycycline 04/03/2015 - 04/11/2015 Ceftriaxone 03/20/2015 through 03/28/2015 Levofloxacin 04/01/2015 through 04/03/2015 Vancomycin 04/02/2015  >> 10/24 (no UOP however). Levofloxacin 04/03/2015 through 04/05/2015 Zosyn 04/05/2015 >>  ENDOCRINE A:  NASH  P:   SSI Hydrocortisone 50 mg IV q6 hours.  NEUROLOGIC A:  Normal mental status prior to intubation 04/06/2015  History of depression present and chronic opioid intake  P:   Continue home Celexa for now Fentanyl drip plus fentanyl as needed  Versed gtt if needed RASS goal: -3   Now bilateral HCAP, ARDS, and MODS. Remains in shock, still w/ high FIO2 needs, and progressive renal failure. Have asked nephrology to see. Hopefully we can improve O2 needs w/ HD  Erick Colace ACNP-BC [place] Pager # [phone] OR # [phone] if no answer  Attending Note:  69 year old female with heart failure history presenting with heart failure, pulmonary edema, renal failure and respiratory failure.  Diffuse crackles on exam.  Patient is not showing any meaningful improvement.  Actually renal function is not deteriorating rapidly.  I am concerned that patient is more likely than not will require dialysis.  Continues to require pressors and unfortunately titrating up.  FiO2 continue to be 100% with high PEEP.  Patient unfortunately have not had any family around.  I reviewed CXR myself and ETT is in OK position but diffuse edema.  Hyperglycemia  noted.  Will increase lantus to 10 units BID.  Discussed with nephrology MD, high dose lasix today and likely CRRT in AM.  RN to contact husband and have a meeting with family tomorrow to determine plan of care.  The patient is critically ill with multiple organ systems failure and requires high complexity decision making for assessment and support, frequent evaluation and titration of therapies, application of advanced monitoring technologies and extensive interpretation of multiple databases.   Critical Care Time devoted to patient care services described in this note is  35  Minutes. This time reflects time of care of this signee Dr Jennet Maduro. This critical care time does not reflect procedure time, or teaching time or supervisory time of PA/NP/Med student/Med Resident etc but could involve care discussion time.  Rush Farmer, M.D. [facility] Pulmonary/Critical Care Medicine. Pager: [phone]. After hours pager: [phone].

## 2015-04-09 ENCOUNTER — Inpatient Hospital Stay (HOSPITAL_COMMUNITY): Payer: Medicare Other

## 2015-04-09 LAB — CBC WITH DIFFERENTIAL/PLATELET
BASOS PCT: 0 %
Basophils Absolute: 0 10*3/uL (ref 0.0–0.1)
Eosinophils Absolute: 0 10*3/uL (ref 0.0–0.7)
Eosinophils Relative: 0 %
HEMATOCRIT: 31.6 % — AB (ref 36.0–46.0)
Hemoglobin: 9.9 g/dL — ABNORMAL LOW (ref 12.0–15.0)
Lymphocytes Relative: 2 %
Lymphs Abs: 0.9 10*3/uL (ref 0.7–4.0)
MCH: 26.2 pg (ref 26.0–34.0)
MCHC: 31.3 g/dL (ref 30.0–36.0)
MCV: 83.6 fL (ref 78.0–100.0)
MONO ABS: 1.4 10*3/uL — AB (ref 0.1–1.0)
MONOS PCT: 3 %
NEUTROS ABS: 45.5 10*3/uL — AB (ref 1.7–7.7)
Neutrophils Relative %: 95 %
Platelets: 112 10*3/uL — ABNORMAL LOW (ref 150–400)
RBC: 3.78 MIL/uL — ABNORMAL LOW (ref 3.87–5.11)
RDW: 19.3 % — AB (ref 11.5–15.5)
WBC: 48.3 10*3/uL — ABNORMAL HIGH (ref 4.0–10.5)

## 2015-04-09 LAB — CULTURE, RESPIRATORY

## 2015-04-09 LAB — BASIC METABOLIC PANEL
ANION GAP: 20 — AB (ref 5–15)
BUN: 105 mg/dL — ABNORMAL HIGH (ref 6–20)
CALCIUM: 7.6 mg/dL — AB (ref 8.9–10.3)
CO2: 16 mmol/L — AB (ref 22–32)
Chloride: 89 mmol/L — ABNORMAL LOW (ref 101–111)
Creatinine, Ser: 3.63 mg/dL — ABNORMAL HIGH (ref 0.44–1.00)
GFR calc Af Amer: 14 mL/min — ABNORMAL LOW (ref >60)
GFR calc non Af Amer: 12 mL/min — ABNORMAL LOW (ref >60)
GLUCOSE: 385 mg/dL — AB (ref 65–99)
Potassium: 4.6 mmol/L (ref 3.5–5.1)
Sodium: 125 mmol/L — ABNORMAL LOW (ref 135–145)

## 2015-04-09 LAB — GLUCOSE, CAPILLARY
GLUCOSE-CAPILLARY: 221 mg/dL — AB (ref 65–99)
GLUCOSE-CAPILLARY: 233 mg/dL — AB (ref 65–99)
GLUCOSE-CAPILLARY: 433 mg/dL — AB (ref 65–99)
GLUCOSE-CAPILLARY: 316 mg/dL — AB (ref 65–99)
GLUCOSE-CAPILLARY: 342 mg/dL — AB (ref 65–99)
GLUCOSE-CAPILLARY: 318 mg/dL — AB (ref 65–99)
GLUCOSE-CAPILLARY: 274 mg/dL — AB (ref 65–99)
GLUCOSE-CAPILLARY: 116 mg/dL — AB (ref 65–99)
Glucose-Capillary: 258 mg/dL — ABNORMAL HIGH (ref 65–99)
Glucose-Capillary: 239 mg/dL — ABNORMAL HIGH (ref 65–99)
Glucose-Capillary: 321 mg/dL — ABNORMAL HIGH (ref 65–99)
Glucose-Capillary: 340 mg/dL — ABNORMAL HIGH (ref 65–99)
Glucose-Capillary: 177 mg/dL — ABNORMAL HIGH (ref 65–99)
Glucose-Capillary: 203 mg/dL — ABNORMAL HIGH (ref 65–99)
Glucose-Capillary: 241 mg/dL — ABNORMAL HIGH (ref 65–99)
Glucose-Capillary: 195 mg/dL — ABNORMAL HIGH (ref 65–99)
Glucose-Capillary: 188 mg/dL — ABNORMAL HIGH (ref 65–99)

## 2015-04-09 LAB — C4 COMPLEMENT: Complement C4, Body Fluid: 24 mg/dL (ref 14–44)

## 2015-04-09 LAB — BLOOD GAS, ARTERIAL
ACID-BASE DEFICIT: 11.5 mmol/L — AB (ref 0.0–2.0)
BICARBONATE: 14.2 meq/L — AB (ref 20.0–24.0)
DRAWN BY: 437071
FIO2: 100
O2 Saturation: 92.2 %
PATIENT TEMPERATURE: 98.2
PEEP: 18 cmH2O
PO2 ART: 78.3 mmHg — AB (ref 80.0–100.0)
Pressure control: 22 cmH2O
RATE: 32 resp/min
TCO2: 15.3 mmol/L (ref 0–100)
pCO2 arterial: 32.9 mmHg — ABNORMAL LOW (ref 35.0–45.0)
pH, Arterial: 7.258 — ABNORMAL LOW (ref 7.350–7.450)

## 2015-04-09 LAB — CULTURE, BODY FLUID-BOTTLE: CULTURE: NO GROWTH

## 2015-04-09 LAB — PROTIME-INR
INR: 2.34 — ABNORMAL HIGH (ref 0.00–1.49)
Prothrombin Time: 25.4 seconds — ABNORMAL HIGH (ref 11.6–15.2)

## 2015-04-09 LAB — MAGNESIUM: MAGNESIUM: 2.1 mg/dL (ref 1.7–2.4)

## 2015-04-09 LAB — PHOSPHORUS: Phosphorus: 7.8 mg/dL — ABNORMAL HIGH (ref 2.5–4.6)

## 2015-04-09 LAB — C3 COMPLEMENT: C3 COMPLEMENT: 88 mg/dL (ref 82–167)

## 2015-04-09 LAB — CULTURE, RESPIRATORY W GRAM STAIN: Culture: NO GROWTH

## 2015-04-09 LAB — ANTINUCLEAR ANTIBODIES, IFA: ANTINUCLEAR ANTIBODIES, IFA: NEGATIVE

## 2015-04-09 LAB — GLOMERULAR BASEMENT MEMBRANE ANTIBODIES: GBM Ab: 4 units (ref 0–20)

## 2015-04-09 LAB — MPO/PR-3 (ANCA) ANTIBODIES

## 2015-04-09 LAB — CULTURE, BODY FLUID W GRAM STAIN -BOTTLE

## 2015-04-09 MED ORDER — HEPARIN (PORCINE) 2000 UNITS/L FOR CRRT
INTRAVENOUS_CENTRAL | Status: DC | PRN
  Filled 2015-04-09: qty 1000

## 2015-04-09 MED ORDER — HEPARIN SODIUM (PORCINE) 5000 UNIT/ML IJ SOLN
250.0000 [IU]/h | INTRAMUSCULAR | Status: DC
  Filled 2015-04-09: qty 2

## 2015-04-09 MED ORDER — SODIUM CHLORIDE 0.9 % IV SOLN
INTRAVENOUS | Status: DC
  Administered 2015-04-09: 1.7 [IU]/h via INTRAVENOUS
  Filled 2015-04-09 (×2): qty 2.5

## 2015-04-09 MED ORDER — PRISMASOL BGK 4/2.5 32-4-2.5 MEQ/L IV SOLN
INTRAVENOUS | Status: DC
  Filled 2015-04-09 (×2): qty 5000

## 2015-04-09 MED ORDER — HEPARIN SODIUM (PORCINE) 1000 UNIT/ML DIALYSIS
1000.0000 [IU] | INTRAMUSCULAR | Status: DC | PRN
  Filled 2015-04-09: qty 6

## 2015-04-09 MED ORDER — PRISMASOL BGK 4/2.5 32-4-2.5 MEQ/L IV SOLN
INTRAVENOUS | Status: DC
  Filled 2015-04-09: qty 5000

## 2015-04-09 MED ORDER — PIPERACILLIN-TAZOBACTAM IN DEX 2-0.25 GM/50ML IV SOLN
2.2500 g | Freq: Four times a day (QID) | INTRAVENOUS | Status: DC
  Administered 2015-04-09 – 2015-04-10 (×3): 2.25 g via INTRAVENOUS
  Filled 2015-04-09 (×5): qty 50

## 2015-04-09 MED ORDER — PRISMASOL BGK 4/2.5 32-4-2.5 MEQ/L IV SOLN
INTRAVENOUS | Status: DC
  Filled 2015-04-09 (×8): qty 5000

## 2015-04-09 MED ORDER — HEPARIN BOLUS VIA INFUSION (CRRT)
1000.0000 [IU] | INTRAVENOUS | Status: DC | PRN
  Filled 2015-04-09: qty 1000

## 2015-04-09 NOTE — Progress Notes (Signed)
   04/09/15 1000  Clinical Encounter Type  Visited With Patient and family together  Visit Type Spiritual support  Referral From Nurse  Spiritual Encounters  Spiritual Needs Prayer;Emotional  Stress Factors  Family Stress Factors Exhausted;Family relationships  Visited with patient's husband at bedside prior to doctor's visit. Husband intended to withdraw tube; anticipated disagreement from other family members. Asked for prayer, which chaplain provided

## 2015-04-09 NOTE — Progress Notes (Signed)
I spoke to husband and he confirms that he does not want CVVHD and doesn't her suffering prolonged.  Given her multiple comorbities and poor prognosis, the decision is very reasonable. He wishes for comfort care. Will Dc CVVHD orders and sign off.

## 2015-04-09 NOTE — Progress Notes (Signed)
ANTIBIOTIC CONSULT NOTE - FOLLOW UP  Pharmacy Consult for Zosyn Indication: rule out pneumonia  Allergies  Allergen Reactions  . Ace Inhibitors Swelling  . Actos [Pioglitazone Hydrochloride] Other (See Comments)    Liver Issues  . Aspirin Other (See Comments)    Was instructed not to take due to Coumadin  . Celecoxib Swelling  . Diovan [Valsartan] Other (See Comments)    Cough.   . Metformin And Related Other (See Comments)    Liver issues  . Tylenol [Acetaminophen] Other (See Comments)    Liver Issues    Patient Measurements: Height: 5\' 7"  (170.2 cm) Weight: (!) 326 lb 15.1 oz (148.3 kg) IBW/kg (Calculated) : 61.6 Vital Signs: Temp: 99.3 F (37.4 C) (10/26 0846) Temp Source: Oral (10/26 0846) BP: 138/54 mmHg (10/26 0831) Pulse Rate: 134 (10/26 0831) Intake/Output from previous day: 10/25 0701 - 10/26 0700 In: 2126 [I.V.:1368; NG/GT:510; IV Piggyback:248] Out: 110 [Urine:110] Intake/Output from this shift: Total I/O In: 170 [I.V.:50; NG/GT:70; IV Piggyback:50] Out: 10 [Urine:10]  Labs:  Recent Labs  04/07/15 0445 04/08/15 0342 04/09/15 0438  WBC 27.3* 34.7* 48.3*  HGB 8.6* 8.9* 9.9*  PLT 227 190 112*  CREATININE 2.66* 3.10* 3.63*   Estimated Creatinine Clearance: 22.5 mL/min (by C-G formula based on Cr of 3.63).  Recent Labs  04/06/15 0915 04/07/15 0445  VANCOTROUGH 46*  --   VANCORANDOM  --  38  Vancomycin discontinued however now no UOP so likely remains on board.   Microbiology: 10/23 Resp: ngtd 10/23 MRSA pcr negative 10/21 Pleural fluid: ngtd 10/19 Blood: ngtd 10/13 Urine: neg  Assessment: 69 year old female on Zosyn for presumed HCAP. Currently no UOP despite calculated CrCl of 26. Nephrology consulted to start CRRT today. WBC continues to climb.  RN notes rash today - under arms and onto back (contact areas).   Goal of Therapy:  Clinical resolution of infection  Plan:  Adjust Zosyn to 2.25g IV q6h while on CRRT.  Follow-up CRRT  tolerance Follow-up rash   Sloan Leiter, PharmD, BCPS Clinical Pharmacist [phone] 04/09/2015,9:02 AM

## 2015-04-09 NOTE — Progress Notes (Signed)
Rcvd phone call from RN stating patient has desaturated since being turned.  Arrived to find SpO2 at 82-85% on 100% FiO2.  Irregular, labored respiratory effort noted.  RN states that patient is DNR with withdrawal of care planned in AM, however, family wished for patient to be kept as stable as possible throughout night.  Recruitment procedure preformed per protocol without complication. SpO2 increased to 93%.  Family appreciative of efforts.

## 2015-04-09 NOTE — Progress Notes (Signed)
S:intubated, sedated O:BP 138/54 mmHg  Pulse 134  Temp(Src) 98.2 F (36.8 C) (Oral)  Resp 34  Ht 5' 7"  (1.702 m)  Wt 148.3 kg (326 lb 15.1 oz)  BMI 51.19 kg/m2  SpO2 92%  Intake/Output Summary (Last 24 hours) at 04/09/15 0844 Last data filed at 04/09/15 0800  Gross per 24 hour  Intake 1996.04 ml  Output    120 ml  Net 1876.04 ml   Weight change: 0.7 kg (1 lb 8.7 oz) DJM:EQASTMH, intubated DQQ:IWLNL, irreg, irreg Resp:Decreased BS bases ant Abd:+ BS + sub Q edema Ext:+ edema NEURO:sedated   . acidophilus  1 capsule Oral Daily  . antiseptic oral rinse  7 mL Mouth Rinse QID  . atorvastatin  20 mg Oral QHS  . chlorhexidine gluconate  15 mL Mouth Rinse BID  . citalopram  20 mg Oral QHS  . feeding supplement (PRO-STAT SUGAR FREE 64)  60 mL Per Tube TID  . furosemide  160 mg Intravenous Q6H  . hydrocortisone sodium succinate  50 mg Intravenous Q6H  . Influenza vac split quadrivalent PF  0.5 mL Intramuscular Tomorrow-1000  . levalbuterol  0.63 mg Nebulization Q6H  . pantoprazole sodium  40 mg Per Tube Q1200  . piperacillin-tazobactam (ZOSYN)  IV  2.25 g Intravenous 3 times per day  . sodium chloride  10-40 mL Intracatheter Q12H   Ct Chest Wo Contrast  04/07/2015  CLINICAL DATA:  Acute respiratory distress and diastolic heart failure with recent LEFT thoracentesis. EXAM: CT CHEST WITHOUT CONTRAST TECHNIQUE: Multidetector CT imaging of the chest was performed following the standard protocol without IV contrast. COMPARISON:  Radiograph 04/06/2015, 04/04/2015, CT abdomen 04/01/15 FINDINGS: Mediastinum/Nodes: No axillary supraclavicular lymphadenopathy. Endotracheal tube in good position. A 10 mm paratracheal subcarinal lymph nodes present. No pericardial effusion. RIGHT PICC line with tip at the cavoatrial junction. Lungs/Pleura: There is dense consolidation in the RIGHT upper lobe with air bronchograms. Diffuse ground-glass opacity with air bronchograms in the RIGHT middle lobe. Dense  consolidation in the RIGHT lower lobe. Consolidation atelectasis in the LEFT lower lobe. There are bilateral pleural effusions which are mild to moderate. No pneumothorax. NG tube extends the stomach. Upper abdomen: NG tube extends the stomach. Anasarca of soft tissues. There is a large fluid collection along the LEFT upper abdomen which runs along the anterior margin of the ribs measuring 18 by 10 cm on image 62, series 5. This fluid is low-attenuation and is consistent with ascites is seen on CT of 04/01/2015. There is anasarca of the soft tissues typically in the LEFT. Musculoskeletal: No aggressive osseous lesion. IMPRESSION: 1. Dense bilateral pneumonia with consolidation and ground-glass opacities. 2. Mild to moderate bilateral pleural effusions. 3. Large volume ascites in the abdomen which extends into the LEFT upper quadrant anteriorly. Electronically Signed   By: Suzy Bouchard M.D.   On: 04/07/2015 12:44   Dg Chest Port 1 View  04/09/2015  CLINICAL DATA:  Acute respiratory failure/ARDS, nonalcoholic hepatitis, history of diabetes, chronic renal insufficiency, CHF EXAM: PORTABLE CHEST 1 VIEW COMPARISON:  Portable chest x-ray of April 08, 2015. FINDINGS: Confluent airspace opacities have progressed to occupies nearly the entire right lung. There is a moderate-sized right pleural effusion. On the left the interstitial markings are slightly more conspicuous in the upper lobe and density in the lower lobe has increased. The cardiac silhouette remains enlarged. The pulmonary vascularity is indistinct. The endotracheal tube tip lies approximately 5.3 cm above the carina. The esophagogastric tube tip cannot be clearly discerned.  The right-sided PICC line tip projects over the midportion of the SVC. IMPRESSION: Worsening alveolar opacity on the right consistent with progressive ARDS -pneumonia. Similar increased density at the left lung base has occurred. Persistent cardiomegaly with pulmonary vascular  congestion. There is a small right pleural effusion. The support tubes are in reasonable position where visualized. Electronically Signed   By: David  Martinique M.D.   On: 04/09/2015 07:30   Dg Chest Port 1 View  04/08/2015  CLINICAL DATA:  Intubated patient, CHF, or respiratory failure. EXAM: PORTABLE CHEST 1 VIEW COMPARISON:  CT scan chest of April 07, 2015 and portable chest x-ray of April 06, 2015. FINDINGS: Confluent alveolar opacity persists in the right upper lobe and at the right lung base. The left hemidiaphragm remains obscured by alveolar opacities. The cardiac silhouette is top-normal in size. The pulmonary vascularity is not clearly engorged. The endotracheal tube tip lies 4.8 cm above the carina. The PICC line tip projects over the junction of the middle and distal thirds of the SVC. The esophagogastric tube appears to extend below the inferior margin of the image. IMPRESSION: Persistent alveolar opacities most compatible with pneumonia. Bilateral pleural effusions are suspected. Stable mild enlargement of the cardiac silhouette with slightly decreased conspicuity of the pulmonary vascularity. Electronically Signed   By: David  Martinique M.D.   On: 04/08/2015 07:32   BMET    Component Value Date/Time   NA 125* 04/09/2015 0438   K 4.6 04/09/2015 0438   CL 89* 04/09/2015 0438   CO2 16* 04/09/2015 0438   GLUCOSE 385* 04/09/2015 0438   BUN 105* 04/09/2015 0438   CREATININE 3.63* 04/09/2015 0438   CREATININE 1.56* 05/07/2013 1441   CALCIUM 7.6* 04/09/2015 0438   GFRNONAA 12* 04/09/2015 0438   GFRAA 14* 04/09/2015 0438   CBC    Component Value Date/Time   WBC 48.3* 04/09/2015 0438   RBC 3.78* 04/09/2015 0438   RBC 3.24* 07/23/2011 1756   HGB 9.9* 04/09/2015 0438   HCT 31.6* 04/09/2015 0438   PLT 112* 04/09/2015 0438   MCV 83.6 04/09/2015 0438   MCH 26.2 04/09/2015 0438   MCHC 31.3 04/09/2015 0438   RDW 19.3* 04/09/2015 0438   LYMPHSABS 0.9 04/09/2015 0438   MONOABS 1.4*  04/09/2015 0438   EOSABS 0.0 04/09/2015 0438   BASOSABS 0.0 04/09/2015 0438     Assessment: 1. Acute on CKD 3 sec ATN from hypotension and poor Rt vent fx 2. Hyponatremia 3. Met acidosis 4. A fib 5. Hypotension on pressors 6. VDRF  ARDS, ? PNA  Plan: 1. UO minimal with high dose lasix.  Will plan CVVD to help with volume, waste products and met acidosis.  Prognosis not good 2. Will try to pull 100cc/hr as BP allows   Charmain Diosdado T

## 2015-04-09 NOTE — Progress Notes (Signed)
PULMONARY / CRITICAL CARE MEDICINE   Name: Krystal [name] MRN: [id number] DOB: 1945-07-19    ADMISSION DATE:  04/02/2015 CONSULTATION DATE:  04/06/2015   REFERRING MD :  Dr. Rexene Alberts from [facility] hospitalist service  Brief history  SIGNIFICANT EVENTS: 03/26/2015 - admitted [facility] 03/25/2015 echocardiogram ejection fraction 65% with severe right ventricular dysfunction. 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 Admitted to [facility] 04/08/2015 w/ working dx of PNA + decop diastolic HF. Hosp course c/b progressive hypoxia, left exudative pleural effusion (drained 1.4 liters on 10/21) and eventually worsening bilateral airspace disease which required intubation on 10/23. Marland Kitchen Required high oxygen and PEEP on the ventilator and transferred to [facility].  Of note patient admission creatinine was 1.6 mg percent and this held steady through 04/04/2015. At the time of transfer creatinine had risen to 2.37 mg percent. Presumably due to Lasix   Subjective: Pt awakens to verbal stimulus, chronically ill appearing. Looks worse than 10/25   VITAL SIGNS: Temp:  [98.2 F (36.8 C)-99.5 F (37.5 C)] 99.3 F (37.4 C) (10/26 0846) Pulse Rate:  [83-134] 126 (10/26 0945) Resp:  [22-42] 24 (10/26 0945) BP: (92-138)/(34-112) 101/47 mmHg (10/26 0945) SpO2:  [91 %-98 %] 96 % (10/26 0945) FiO2 (%):  [90 %-100 %] 100 % (10/26 0831) Weight:  [148.3 kg (326 lb 15.1 oz)] 148.3 kg (326 lb 15.1 oz) (10/26 0500)   HEMODYNAMICS: CVP:  [18 mmHg-22 mmHg] 18 mmHg  VENTILATOR SETTINGS: Vent Mode:  [-] PCV FiO2 (%):  [90 %-100 %] 100 % Set Rate:  [32 bmp] 32 bmp PEEP:  [18 cmH20] 18 cmH20 Plateau Pressure:  [37 cmH20-39 cmH20] 38 cmH20  INTAKE / OUTPUT:  Intake/Output Summary  (Last 24 hours) at 04/09/15 1004 Last data filed at 04/09/15 0900  Gross per 24 hour  Intake 1928.04 ml  Output    115 ml  Net 1813.04 ml   PHYSICAL EXAMINATION: General: Morbidly obese intubated female, opens eyes to verbal stimulus  HEENT:  NCAT, orally intubated. MMM.  Cardio: RRR, S1/S2, -M/R/G. Lungs: Diffuse crackles on bilaterally, diminished in bases  Abdomen: Obese, distended, non-tender.  Muscular: Bilateral 3+ edema Skin: Intact, scattered bruising noted. Erythematous rash noted to bilateral underarms and back.   LABS: PULMONARY  Recent Labs Lab 04/06/15 1512 04/06/15 2110 04/07/15 0400 04/08/15 0409 04/09/15 0435  PHART 7.423 7.441 7.368 7.365 7.258*  PCO2ART 31.1* 28.0* 32.7* 32.0* 32.9*  PO2ART 87.8 64.9* 90.1 56.0* 78.3*  HCO3 19.9* 18.9* 18.4* 18.4* 14.2*  TCO2 20.9 19.7 19.4 19 15.3  O2SAT 95.9 92.5 95.4 89.0 92.2   CBC  Recent Labs Lab 04/07/15 0445 04/08/15 0342 04/09/15 0438  HGB 8.6* 8.9* 9.9*  HCT 27.1* 28.2* 31.6*  WBC 27.3* 34.7* 48.3*  PLT 227 190 112*   COAGULATION  Recent Labs Lab 04/05/15 0441 04/06/15 0435 04/07/15 0445 04/08/15 0342 04/09/15 0438  INR 1.96* 2.57* 3.47* 2.21* 2.34*   CARDIAC  No results for input(s): TROPONINI in the last 168 hours. No results for input(s): PROBNP in the last 168 hours.  CHEMISTRY  Recent Labs Lab 04/06/15 0435 04/06/15 2220 04/07/15 0445 04/08/15 0342 04/09/15 0438  NA 134* 133* 131* 129* 125*  K 4.3 4.5 4.4 4.4 4.6  CL 96* 95* 97* 93* 89*  CO2 22 22 21*  19* 16*  GLUCOSE 193* 204* 351* 330* 385*  BUN 63* 67* 68* 80* 105*  CREATININE 2.37* 2.69* 2.66* 3.10* 3.63*  CALCIUM 7.8* 7.5* 7.6* 7.6* 7.6*  MG  --   --  2.0 2.0 2.1  PHOS  --   --  6.2* 6.8* 7.8*   Estimated Creatinine Clearance: 22.5 mL/min (by C-G formula based on Cr of 3.63).  LIVER  Recent Labs Lab 04/05/15 0441 04/06/15 0435 04/07/15 0445 04/08/15 0342 04/09/15 0438  AST  --   --  35  --   --   ALT  --    --  8*  --   --   ALKPHOS  --   --  103  --   --   BILITOT  --   --  4.5*  --   --   PROT  --   --  6.0*  --   --   ALBUMIN  --   --  1.6*  --   --   INR 1.96* 2.57* 3.47* 2.21* 2.34*     INFECTIOUS No results for input(s): LATICACIDVEN, PROCALCITON in the last 168 hours.  ENDOCRINE CBG (last 3)   Recent Labs  04/09/15 0658 04/09/15 0755 04/09/15 0904  GLUCAP 433* 316* 340*   IMAGING x48h  - image(s) personally visualized  -   highlighted in bold Ct Chest Wo Contrast  04/07/2015  CLINICAL DATA:  Acute respiratory distress and diastolic heart failure with recent LEFT thoracentesis. EXAM: CT CHEST WITHOUT CONTRAST TECHNIQUE: Multidetector CT imaging of the chest was performed following the standard protocol without IV contrast. COMPARISON:  Radiograph 04/06/2015, 04/04/2015, CT abdomen 04/01/15 FINDINGS: Mediastinum/Nodes: No axillary supraclavicular lymphadenopathy. Endotracheal tube in good position. A 10 mm paratracheal subcarinal lymph nodes present. No pericardial effusion. RIGHT PICC line with tip at the cavoatrial junction. Lungs/Pleura: There is dense consolidation in the RIGHT upper lobe with air bronchograms. Diffuse ground-glass opacity with air bronchograms in the RIGHT middle lobe. Dense consolidation in the RIGHT lower lobe. Consolidation atelectasis in the LEFT lower lobe. There are bilateral pleural effusions which are mild to moderate. No pneumothorax. NG tube extends the stomach. Upper abdomen: NG tube extends the stomach. Anasarca of soft tissues. There is a large fluid collection along the LEFT upper abdomen which runs along the anterior margin of the ribs measuring 18 by 10 cm on image 62, series 5. This fluid is low-attenuation and is consistent with ascites is seen on CT of 04/01/2015. There is anasarca of the soft tissues typically in the LEFT. Musculoskeletal: No aggressive osseous lesion. IMPRESSION: 1. Dense bilateral pneumonia with consolidation and ground-glass  opacities. 2. Mild to moderate bilateral pleural effusions. 3. Large volume ascites in the abdomen which extends into the LEFT upper quadrant anteriorly. Electronically Signed   By: Suzy Bouchard M.D.   On: 04/07/2015 12:44   Dg Chest Port 1 View  04/09/2015  CLINICAL DATA:  Acute respiratory failure/ARDS, nonalcoholic hepatitis, history of diabetes, chronic renal insufficiency, CHF EXAM: PORTABLE CHEST 1 VIEW COMPARISON:  Portable chest x-ray of April 08, 2015. FINDINGS: Confluent airspace opacities have progressed to occupies nearly the entire right lung. There is a moderate-sized right pleural effusion. On the left the interstitial markings are slightly more conspicuous in the upper lobe and density in the lower lobe has increased. The cardiac silhouette remains enlarged. The pulmonary vascularity is indistinct. The endotracheal tube tip lies approximately 5.3 cm above the carina. The esophagogastric tube tip cannot be clearly discerned. The  right-sided PICC line tip projects over the midportion of the SVC. IMPRESSION: Worsening alveolar opacity on the right consistent with progressive ARDS -pneumonia. Similar increased density at the left lung base has occurred. Persistent cardiomegaly with pulmonary vascular congestion. There is a small right pleural effusion. The support tubes are in reasonable position where visualized. Electronically Signed   By: David  Martinique M.D.   On: 04/09/2015 07:30   Dg Chest Port 1 View  04/08/2015  CLINICAL DATA:  Intubated patient, CHF, or respiratory failure. EXAM: PORTABLE CHEST 1 VIEW COMPARISON:  CT scan chest of April 07, 2015 and portable chest x-ray of April 06, 2015. FINDINGS: Confluent alveolar opacity persists in the right upper lobe and at the right lung base. The left hemidiaphragm remains obscured by alveolar opacities. The cardiac silhouette is top-normal in size. The pulmonary vascularity is not clearly engorged. The endotracheal tube tip lies 4.8 cm  above the carina. The PICC line tip projects over the junction of the middle and distal thirds of the SVC. The esophagogastric tube appears to extend below the inferior margin of the image. IMPRESSION: Persistent alveolar opacities most compatible with pneumonia. Bilateral pleural effusions are suspected. Stable mild enlargement of the cardiac silhouette with slightly decreased conspicuity of the pulmonary vascularity. Electronically Signed   By: David  Martinique M.D.   On: 04/08/2015 07:32   ASSESSMENT / PLAN:  PULMONARY OETT 04/06/2015  A: Acute hypoxemic respiratory failure at admission 04/07/2015 due to acute diastolic heart failure.  S/p L sided thoracentesis 1.4 L 04/04/2015 > Exudative pleural effusion  New infiltrates on the contralateral right-sided 04/05/2015 and intubated 04/06/2015. ARDS physiology 04/06/2015  HCAP --> ARDS  She's on maximum ventilatory support using PC ventilation with a plateau pressure of 40 & on 100% FiO2  P:   No further escalation of care per family request  Predicted withdrawal date 10/27 Full vent support See ID     CARDIOVASCULAR CVL PICC LINE PLACED AT [place]  A: Hypotension in the setting of septic shock  Pressor support increasing overnight 10/26   History of diastolic heart failure and cor pulmonale  History of A. fib RVR but here in sinus rhythm > is on chronic anticoagulation with Coumadin  P:  Neo-synephrine for MAP >65  Continue anticoagulation for pharmacy   RENAL A:   AG Metabolic Acidosis  CKD at baseline creatinine 1.5  Acute kidney injury since 04/05/2015 >> Worsening Renal Failure and CVVHD not an option per family P:   Family wishes to not proceed with HD Renal Dose Meds  Tele monitor.  GASTROINTESTINAL A:   Hx of NASH but no cirrhosis (per husband)  Ascites  P:   TF per nutrition. Protonix  HEMATOLOGY  A:  Anemia of chronic disease and critical illness Chronic anticoagulation with coumadin  P:  Monitor  INR F/u CBC   INFECTIOUS Tracheal aspirate bacterial 04/06/2015>> Blood culture 04/06/2015>> Urine culture 04/06/2015>>No growth   A:   Possible HCAP 04/05/2015 P:   Doxycycline 03/27/2015 - 03/19/2015 Ceftriaxone 04/02/2015 through 04/04/2015 Levofloxacin 04/01/2015 through 04/03/2015 Vancomycin 04/02/2015  >> 10/24 (no UOP however). Levofloxacin 04/03/2015 through 04/05/2015 Zosyn 04/05/2015 >>  ENDOCRINE A:  NASH    Hyperglycemia  Weaning Cortisol-CBG should improve  P:   Pt placed on insulin gtt for hyperglycemia  Consider weaning hydrocortisone 50 mg IV q6 hours.  NEUROLOGIC A:  Normal mental status prior to intubation 04/06/2015  History of depression present and chronic opioid intake  P:   Continue home  Celexa for now Fentanyl drip plus fentanyl as needed  RASS goal: -3   Today's Summary:  Pt with bilateral HCAP, ARDS, and MODS. Remains in shock w/ high FIO2 needs, pressor support, and worsening renal failure. After discussion, pt's family does not wish to escalate care and patient made DNR. Plan is to withdrawal tomorrow 10/27.   Erick Colace ACNP-BC [place] Pager # [phone] OR # [phone] if no answer   Attending Note:  69 year old female with renal, cardiac and respiratory failure who is failing to improve.  I spoke with the family at length.  Husband and son present.  After discussion, decision was made not to further advance treatment at this point.  No dialysis.  Will make a full DNR now and will change to comfort care in AM if family is ready.  Remains unresponsive on exam.  No further advancement of care at this point beyond comfort.  The patient is critically ill with multiple organ systems failure and requires high complexity decision making for assessment and support, frequent evaluation and titration of therapies, application of advanced monitoring technologies and extensive interpretation of multiple databases.    Critical Care Time devoted to patient care services described in this note is  35  Minutes. This time reflects time of care of this signee Dr Jennet Maduro. This critical care time does not reflect procedure time, or teaching time or supervisory time of PA/NP/Med student/Med Resident etc but could involve care discussion time.  Rush Farmer, M.D. [facility] Pulmonary/Critical Care Medicine. Pager: [phone]. After hours pager: [phone].

## 2015-04-10 ENCOUNTER — Inpatient Hospital Stay (HOSPITAL_COMMUNITY): Payer: Medicare Other

## 2015-04-10 LAB — GLUCOSE, CAPILLARY
GLUCOSE-CAPILLARY: 379 mg/dL — AB (ref 65–99)
Glucose-Capillary: 343 mg/dL — ABNORMAL HIGH (ref 65–99)

## 2015-04-15 ENCOUNTER — Telehealth: Payer: Self-pay

## 2015-04-15 NOTE — Progress Notes (Signed)
160ml fentanyl wasted in sink, witnessed by Josue Hector, RN

## 2015-04-15 NOTE — Progress Notes (Signed)
eLink Physician-Brief Progress Note Patient Name: Krystal [name] DOB: October 10, 1945 MRN: [id number]   Date of Service  19-Apr-2015  HPI/Events of Note  Pt DNR. Asystole on monitor  eICU Interventions  Extubate RN to pronounce     Intervention Category Major Interventions: Other:  Merton Border Apr 19, 2015, 1:47 AM

## 2015-04-15 NOTE — Care Management Note (Signed)
Case Management Note  Patient Details  Name: RHENA GLACE MRN: [id number] Date of Birth: Apr 01, 1946  Subjective/Objective:                    Action/Plan:   Expected Discharge Date:  03/27/15               Expected Discharge Plan:  Skilled Nursing Facility  In-House Referral:  Clinical Social Work  Discharge planning Services  CM Consult  Post Acute Care Choice:  Home Health Choice offered to:  Patient  DME Arranged:    DME Agency:     HH Arranged:    [place] Agency:     Status of Service:  Completed, signed off  Medicare Important Message Given:  Yes-second notification given Date Medicare IM Given:    Medicare IM give by:    Date Additional Medicare IM Given:    Additional Medicare Important Message give by:     If discussed at [place] of Stay Meetings, dates discussed:    Additional Comments:  Vergie Living, RN 04-30-15, 7:34 AM

## 2015-04-15 NOTE — Progress Notes (Signed)
Pt asystole on monitor 0146. Pulse and breathe sounds palpated and auscultated. Pt pronounced by Deboraha Sprang, RN and Alessandra Grout, RN.

## 2015-04-15 NOTE — Progress Notes (Signed)
Chaplain responded the Nursing Unit at the request of the Nursing Staff for the death of a patient.  Upon arrival to the Unit the Chaplain greeted the family in the conference room where words of comfort were given as the husband and sister shared life stories of the patient were shared. At the family request a prayer of peace and comfort were spoken. Chaplain informed the family of all needed information for the Nursing Staff, and they were given a Patient Placement card to inform the hospital of funeral home selection at a later time.  The family was very appreciative of all the support of the Nursing Unit. Chaplain Yaakov Guthrie [phone]

## 2015-04-15 NOTE — Telephone Encounter (Signed)
On 04/15/2015 I received a death certificate from [place]. The death certificate is for burial. The patient is a patient of Doctor Nelda Marseille. The death certificate will be taken to [facility]-Link this am for signature. On 2015-05-01 I received the death certificate back from Doctor Nelda Marseille. I got the death certificate ready and called the funeral home to let them know I was mailing the death certificate to the [facility] per their request.

## 2015-04-15 DEATH — deceased

## 2015-05-15 NOTE — Discharge Summary (Signed)
Krystal [name], Krystal [name]             ACCOUNT NO.:  [id number]  MEDICAL RECORD NO.:  [id number]  LOCATION:  2M15C                        FACILITY:  [facility]  PHYSICIAN:  Providence Lanius, MD  DATE OF BIRTH:  1946/04/21  DATE OF ADMISSION:  03/17/2015 DATE OF DISCHARGE:  2015/04/14                              DISCHARGE SUMMARY   PRIMARY DIAGNOSIS/CAUSE OF DEATH:  Adult respiratory distress syndrome.  SECONDARY DIAGNOSES:  Respiratory failure, healthcare-associated pneumonia, acute renal failure, anasarca, pulmonary edema, atrial fibrillation with rapid ventricular response, diastolic heart failure and cor pulmonale, nonalcoholic steatohepatitis, anemia of chronic disease and chronic anticoagulation.  The patient is a 69 year old female with history of NASH and congestive heart failure with core pulmonale.  Presented to the hospital with shortness of breath, found to have an HCAP, developed ARDS, subsequent renal failure and shock.  The patient with severe ARDS and 100% oxygen and desaturating.  I spoke with the family after discussion to make the patient do not resuscitate with no further escalation of care.  On the night after the decision, not to escalate care any further, the patient is being turned as part of routine ICU care, desaturated, oxygen level continued to drop, failed to increase and given the fact that she was DNR, the patient developed pulseless electrical activity and given the fact that she was DNR, code was not called, and the patient was pronounced dead by two nurses, the family notified.     Providence Lanius, MD     WJY/MEDQ  D:  04/22/2015  T:  04/23/2015  Job:  [id number]

## 2015-05-18 LAB — AFB CULTURE WITH SMEAR (NOT AT ARMC): Acid Fast Smear: NONE SEEN
